# Patient Record
Sex: Female | Born: 1990 | Race: Black or African American | Hispanic: No | Marital: Single | Smoking: Never smoker
Health system: Southern US, Community
[De-identification: ages and names within clinical notes are randomized; demographics above are authoritative.]

## PROBLEM LIST (undated history)

## (undated) ENCOUNTER — Inpatient Hospital Stay (HOSPITAL_COMMUNITY): Payer: Self-pay

## (undated) DIAGNOSIS — R51 Headache: Secondary | ICD-10-CM

## (undated) DIAGNOSIS — L0292 Furuncle, unspecified: Secondary | ICD-10-CM

## (undated) DIAGNOSIS — F909 Attention-deficit hyperactivity disorder, unspecified type: Secondary | ICD-10-CM

## (undated) DIAGNOSIS — O210 Mild hyperemesis gravidarum: Secondary | ICD-10-CM

## (undated) DIAGNOSIS — N390 Urinary tract infection, site not specified: Secondary | ICD-10-CM

## (undated) DIAGNOSIS — D219 Benign neoplasm of connective and other soft tissue, unspecified: Secondary | ICD-10-CM

## (undated) DIAGNOSIS — T4145XA Adverse effect of unspecified anesthetic, initial encounter: Secondary | ICD-10-CM

## (undated) DIAGNOSIS — R519 Headache, unspecified: Secondary | ICD-10-CM

## (undated) DIAGNOSIS — T8859XA Other complications of anesthesia, initial encounter: Secondary | ICD-10-CM

## (undated) DIAGNOSIS — R87629 Unspecified abnormal cytological findings in specimens from vagina: Secondary | ICD-10-CM

## (undated) DIAGNOSIS — D649 Anemia, unspecified: Secondary | ICD-10-CM

## (undated) HISTORY — DX: Furuncle, unspecified: L02.92

## (undated) HISTORY — DX: Adverse effect of unspecified anesthetic, initial encounter: T41.45XA

## (undated) HISTORY — DX: Unspecified abnormal cytological findings in specimens from vagina: R87.629

## (undated) HISTORY — DX: Other complications of anesthesia, initial encounter: T88.59XA

## (undated) HISTORY — DX: Attention-deficit hyperactivity disorder, unspecified type: F90.9

## (undated) HISTORY — PX: WISDOM TOOTH EXTRACTION: SHX21

## (undated) HISTORY — PX: INDUCED ABORTION: SHX677

## (undated) HISTORY — PX: OTHER SURGICAL HISTORY: SHX169

---
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
## 2011-03-30 ENCOUNTER — Ambulatory Visit (INDEPENDENT_AMBULATORY_CARE_PROVIDER_SITE_OTHER): Payer: Medicaid Other | Admitting: *Deleted

## 2011-03-30 DIAGNOSIS — Z309 Encounter for contraceptive management, unspecified: Secondary | ICD-10-CM

## 2011-03-30 MED ORDER — MEDROXYPROGESTERONE ACETATE 150 MG/ML IM SUSP
150.0000 mg | Freq: Once | INTRAMUSCULAR | Status: AC
  Administered 2011-03-30: 150 mg via INTRAMUSCULAR

## 2011-03-30 NOTE — Progress Notes (Signed)
Urine pregnancy test done because patient is late for depo. Results negative. States she has not had sex in past week . Advised to use extra protection for next 7 days.

## 2011-05-29 LAB — CULTURE, ROUTINE-ABSCESS

## 2011-06-27 ENCOUNTER — Ambulatory Visit: Payer: Medicaid Other

## 2011-07-05 ENCOUNTER — Encounter: Payer: Medicaid Other | Admitting: Family Medicine

## 2011-08-14 ENCOUNTER — Ambulatory Visit (INDEPENDENT_AMBULATORY_CARE_PROVIDER_SITE_OTHER): Payer: Medicaid Other | Admitting: *Deleted

## 2011-08-14 DIAGNOSIS — Z309 Encounter for contraceptive management, unspecified: Secondary | ICD-10-CM

## 2011-08-14 LAB — POCT URINE PREGNANCY: Preg Test, Ur: NEGATIVE

## 2011-08-14 MED ORDER — MEDROXYPROGESTERONE ACETATE 150 MG/ML IM SUSP
150.0000 mg | Freq: Once | INTRAMUSCULAR | Status: AC
  Administered 2011-08-14: 150 mg via INTRAMUSCULAR

## 2011-08-14 NOTE — Progress Notes (Signed)
Patient is late for depo injection today. Urine pregnancy test negative. States last sex was 6-13 days ago but  a condom was used. Advised to return in 2 weeks for repeat urine pregnancy test. Extra protection for next 7 days.

## 2011-11-09 ENCOUNTER — Encounter: Payer: Medicaid Other | Admitting: Family Medicine

## 2011-11-10 ENCOUNTER — Ambulatory Visit (INDEPENDENT_AMBULATORY_CARE_PROVIDER_SITE_OTHER): Payer: Medicaid Other | Admitting: Family Medicine

## 2011-11-10 ENCOUNTER — Other Ambulatory Visit (HOSPITAL_COMMUNITY)
Admission: RE | Admit: 2011-11-10 | Discharge: 2011-11-10 | Disposition: A | Discharge status: Home / Self Care | Payer: Medicaid Other | Source: Ambulatory Visit | Attending: Family Medicine | Admitting: Family Medicine

## 2011-11-10 ENCOUNTER — Encounter: Payer: Self-pay | Admitting: Family Medicine

## 2011-11-10 VITALS — BP 123/78 | HR 92 | Ht 68.0 in | Wt 123.0 lb

## 2011-11-10 DIAGNOSIS — A499 Bacterial infection, unspecified: Secondary | ICD-10-CM

## 2011-11-10 DIAGNOSIS — Z113 Encounter for screening for infections with a predominantly sexual mode of transmission: Secondary | ICD-10-CM | POA: Insufficient documentation

## 2011-11-10 DIAGNOSIS — N760 Acute vaginitis: Secondary | ICD-10-CM

## 2011-11-10 DIAGNOSIS — N898 Other specified noninflammatory disorders of vagina: Secondary | ICD-10-CM

## 2011-11-10 DIAGNOSIS — B9689 Other specified bacterial agents as the cause of diseases classified elsewhere: Secondary | ICD-10-CM

## 2011-11-10 LAB — POCT WET PREP (WET MOUNT)

## 2011-11-10 MED ORDER — METRONIDAZOLE 500 MG PO TABS: 500.0000 mg | ORAL_TABLET | Freq: Two times a day (BID) | ORAL | Status: AC

## 2011-11-10 NOTE — Progress Notes (Signed)
  Subjective:    Patient ID: Sharon Barnett, female    DOB: April 14, 1991, 21 y.o.   MRN: [id number]  HPI VAGINAL DISCHARGE Onset: 1-2 weeks Description: Previously treated for BV one year. Discharge is similar to last episode of BV.  Modifying factors: none  Symptoms Odor: yes Itching: no Vaginal burning: no Dysuria: no Bleeding: no Pelvic pain: intermittently; associated with menses  Back pain: no Fever: no Genital sores: no Rash: no Dyspareunia: no GI Symptoms: no  Red Flags:  Missed period:no  Recent antibiotics:no  Possible STD exposure: No; no recent sexual activity in >1 year per pt  IUD: no Diabetes: no  Review of Systems See HPI, otherwise ROS negative     Objective:   Physical Exam Gen: in bed, NAD CV: RRR PULM: CTAB ABD: S/NT/+ bowel sounds GU: deferred as pt felt uncomfortable with female provider.    Assessment & Plan:

## 2011-11-10 NOTE — Assessment & Plan Note (Signed)
Exam clinically consistent with BV. Was not able to perform GYN exam out of pt discomfort with female provider. Discussed this with pt including option for female provider, which pt agreed to. Pt agreeable to urine GC/Chl and self wet prep. Will empirically treat for BV with flagyl. Instructed pt to follow up with new PCP in 2-4 weeks to discuss any other issues.

## 2011-11-10 NOTE — Patient Instructions (Signed)
Bacterial Vaginosis Bacterial vaginosis (BV) is a vaginal infection where the normal balance of bacteria in the vagina is disrupted. The normal balance is then replaced by an overgrowth of certain bacteria. There are several different kinds of bacteria that can cause BV. BV is the most common vaginal infection in women of childbearing age. CAUSES   The cause of BV is not fully understood. BV develops when there is an increase or imbalance of harmful bacteria.   Some activities or behaviors can upset the normal balance of bacteria in the vagina and put women at increased risk including:   Having a new sex partner or multiple sex partners.   Douching.   Using an intrauterine device (IUD) for contraception.   It is not clear what role sexual activity plays in the development of BV. However, women that have never had sexual intercourse are rarely infected with BV.  Women do not get BV from toilet seats, bedding, swimming pools or from touching objects around them.  SYMPTOMS   Grey vaginal discharge.   A fish-like odor with discharge, especially after sexual intercourse.   Itching or burning of the vagina and vulva.   Burning or pain with urination.   Some women have no signs or symptoms at all.  DIAGNOSIS  Your caregiver must examine the vagina for signs of BV. Your caregiver will perform lab tests and look at the sample of vaginal fluid through a microscope. They will look for bacteria and abnormal cells (clue cells), a pH test higher than 4.5, and a positive amine test all associated with BV.  RISKS AND COMPLICATIONS   Pelvic inflammatory disease (PID).   Infections following gynecology surgery.   Developing HIV.   Developing herpes virus.  TREATMENT  Sometimes BV will clear up without treatment. However, all women with symptoms of BV should be treated to avoid complications, especially if gynecology surgery is planned. Female partners generally do not need to be treated. However,  BV may spread between female sex partners so treatment is helpful in preventing a recurrence of BV.   BV may be treated with antibiotics. The antibiotics come in either pill or vaginal cream forms. Either can be used with nonpregnant or pregnant women, but the recommended dosages differ. These antibiotics are not harmful to the baby.   BV can recur after treatment. If this happens, a second round of antibiotics will often be prescribed.   Treatment is important for pregnant women. If not treated, BV can cause a premature delivery, especially for a pregnant woman who had a premature birth in the past. All pregnant women who have symptoms of BV should be checked and treated.   For chronic reoccurrence of BV, treatment with a type of prescribed gel vaginally twice a week is helpful.  HOME CARE INSTRUCTIONS   Finish all medication as directed by your caregiver.   Do not have sex until treatment is completed.   Tell your sexual partner that you have a vaginal infection. They should see their caregiver and be treated if they have problems, such as a mild rash or itching.   Practice safe sex. Use condoms. Only have 1 sex partner.  PREVENTION  Basic prevention steps can help reduce the risk of upsetting the natural balance of bacteria in the vagina and developing BV:  Do not have sexual intercourse (be abstinent).   Do not douche.   Use all of the medicine prescribed for treatment of BV, even if the signs and symptoms go away.     Tell your sex partner if you have BV. That way, they can be treated, if needed, to prevent reoccurrence.  SEEK MEDICAL CARE IF:   Your symptoms are not improving after 3 days of treatment.   You have increased discharge, pain, or fever.  MAKE SURE YOU:   Understand these instructions.   Will watch your condition.   Will get help right away if you are not doing well or get worse.  FOR MORE INFORMATION  Division of STD Prevention (DSTDP), Centers for Disease  Control and Prevention: www.cdc.gov/std American Social Health Association (ASHA): www.ashastd.org  Document Released: 08/21/2005 Document Revised: 08/10/2011 Document Reviewed: 02/11/2009 ExitCare Patient Information 2012 [employer]. 

## 2011-11-20 ENCOUNTER — Telehealth: Payer: Self-pay | Admitting: *Deleted

## 2011-11-20 NOTE — Telephone Encounter (Addendum)
Mother calling because patient's father passed away "suddenly" while patient was at school.  Wants appt for grief counseling.  Spoke with Hospice and due to age, patient qualifies for grief counseling with [facility] or an adult Veterinary surgeon.  Mother given phone number for [facility] [phone]) and [facility] adult counseling [phone] Levada Schilling).  Mother will contact them for an appt.  Gaylene Brooks, RN

## 2012-07-15 ENCOUNTER — Ambulatory Visit: Payer: Medicaid Other | Admitting: Emergency Medicine

## 2012-10-18 ENCOUNTER — Ambulatory Visit: Payer: Medicaid Other | Admitting: Family Medicine

## 2012-11-14 ENCOUNTER — Encounter: Payer: Medicaid Other | Admitting: Emergency Medicine

## 2012-12-06 ENCOUNTER — Encounter: Payer: Medicaid Other | Admitting: Emergency Medicine

## 2013-04-25 ENCOUNTER — Encounter: Payer: Self-pay | Admitting: Emergency Medicine

## 2013-04-25 ENCOUNTER — Ambulatory Visit (INDEPENDENT_AMBULATORY_CARE_PROVIDER_SITE_OTHER): Payer: Medicaid Other | Admitting: Emergency Medicine

## 2013-04-25 VITALS — BP 116/75 | HR 85 | Ht 68.5 in | Wt 117.8 lb

## 2013-04-25 DIAGNOSIS — R82998 Other abnormal findings in urine: Secondary | ICD-10-CM

## 2013-04-25 DIAGNOSIS — N898 Other specified noninflammatory disorders of vagina: Secondary | ICD-10-CM

## 2013-04-25 LAB — POCT URINALYSIS DIPSTICK
Ketones, UA: NEGATIVE
Nitrite, UA: NEGATIVE
Protein, UA: 30

## 2013-04-25 LAB — POCT WET PREP (WET MOUNT)

## 2013-04-25 LAB — POCT UA - MICROSCOPIC ONLY

## 2013-04-25 MED ORDER — METRONIDAZOLE 500 MG PO TABS: 500.0000 mg | ORAL_TABLET | Freq: Two times a day (BID) | ORAL | Status: DC

## 2013-04-25 MED ORDER — HYDROCORTISONE 2.5 % EX CREA: TOPICAL_CREAM | Freq: Two times a day (BID) | CUTANEOUS | Status: DC

## 2013-04-25 NOTE — Progress Notes (Signed)
  Subjective:    Patient ID: Sharon Barnett, female    DOB: 01-Jan-1991, 21 y.o.   MRN: [id number]  HPI Sharon Barnett is here for vaginal discharge.  She reports that she has had a thin white discharge for the last month.  She states that it is getting worse.  She has not noticed an odor.  Washes with dial or body wash.  No fevers, chills, nausea, vomiting, pelvic pain.  Denies new sexual partners.  Denies using vibrators.  Reports dark urine, but no dysuria.  I have reviewed and updated the following as appropriate: allergies and current medications SHx: never smoker  Review of Systems See HPI    Objective:   Physical Exam BP 116/75  Pulse 85  Ht 5' 8.5" (1.74 m)  Wt 117 lb 12.8 oz (53.434 kg)  BMI 17.65 kg/m2  LMP 04/10/2013 Gen: alert, cooperative, NAD Abd: +BS, soft, NTND  UA: concentrated with spec gravity of >1.030, 30 protein Wet prep: clue cells    Assessment & Plan:

## 2013-04-25 NOTE — Patient Instructions (Addendum)
It was nice to see you!  You have bacterial vaginosis.  Take flagyl 1 pill twice a day for 7 days.  The only soap you can use in the genital area is mild dove - with no scents, fragrances or dyes.  Make an appointment with me after the holidays to do your well woman exam.

## 2013-04-25 NOTE — Assessment & Plan Note (Signed)
BV on wet prep. Flagyl 500mg  BID x7 days. Discussed avoiding harsh soaps to prevent recurrence.  Also discussed general hygiene.

## 2013-08-21 ENCOUNTER — Emergency Department (HOSPITAL_COMMUNITY)
Admission: EM | Admit: 2013-08-21 | Discharge: 2013-08-21 | Disposition: A | Discharge status: Home / Self Care | Payer: Medicaid Other | Attending: Emergency Medicine | Admitting: Emergency Medicine

## 2013-08-21 ENCOUNTER — Emergency Department (HOSPITAL_COMMUNITY): Payer: Medicaid Other

## 2013-08-21 ENCOUNTER — Encounter (HOSPITAL_COMMUNITY): Payer: Self-pay | Admitting: Emergency Medicine

## 2013-08-21 DIAGNOSIS — R55 Syncope and collapse: Secondary | ICD-10-CM | POA: Insufficient documentation

## 2013-08-21 DIAGNOSIS — Z872 Personal history of diseases of the skin and subcutaneous tissue: Secondary | ICD-10-CM | POA: Insufficient documentation

## 2013-08-21 DIAGNOSIS — Z8659 Personal history of other mental and behavioral disorders: Secondary | ICD-10-CM | POA: Insufficient documentation

## 2013-08-21 DIAGNOSIS — T4275XA Adverse effect of unspecified antiepileptic and sedative-hypnotic drugs, initial encounter: Secondary | ICD-10-CM | POA: Insufficient documentation

## 2013-08-21 DIAGNOSIS — K1370 Unspecified lesions of oral mucosa: Secondary | ICD-10-CM | POA: Insufficient documentation

## 2013-08-21 DIAGNOSIS — T50905A Adverse effect of unspecified drugs, medicaments and biological substances, initial encounter: Secondary | ICD-10-CM

## 2013-08-21 DIAGNOSIS — Z3202 Encounter for pregnancy test, result negative: Secondary | ICD-10-CM | POA: Insufficient documentation

## 2013-08-21 LAB — CBC WITH DIFFERENTIAL/PLATELET
Basophils Absolute: 0 10*3/uL (ref 0.0–0.1)
Eosinophils Absolute: 0 10*3/uL (ref 0.0–0.7)
HCT: 36.5 % (ref 36.0–46.0)
MCV: 89.2 fL (ref 78.0–100.0)
Neutrophils Relative %: 74 % (ref 43–77)
Platelets: 287 10*3/uL (ref 150–400)
RBC: 4.09 MIL/uL (ref 3.87–5.11)
RDW: 12.8 % (ref 11.5–15.5)
WBC: 6.1 10*3/uL (ref 4.0–10.5)

## 2013-08-21 LAB — BASIC METABOLIC PANEL
BUN: 6 mg/dL (ref 6–23)
Calcium: 9 mg/dL (ref 8.4–10.5)
Creatinine, Ser: 0.71 mg/dL (ref 0.50–1.10)
GFR calc non Af Amer: >90 mL/min (ref >90)
Glucose, Bld: 87 mg/dL (ref 70–99)
Sodium: 138 mEq/L (ref 135–145)

## 2013-08-21 MED ORDER — SODIUM CHLORIDE 0.9 % IV BOLUS (SEPSIS)
500.0000 mL | Freq: Once | INTRAVENOUS | Status: AC
  Administered 2013-08-21: 500 mL via INTRAVENOUS

## 2013-08-21 NOTE — ED Provider Notes (Signed)
CSN: [id number]     Arrival date & time 08/21/13  1234 History   First MD Initiated Contact with Patient 08/21/13 1240     Chief Complaint  Patient presents with  . Medication Reaction    Patient is a 22 y.o. female presenting with syncope. The history is provided by the EMS personnel and the patient. The history is limited by the condition of the patient.  Loss of Consciousness Episode history:  Single Timing:  Constant Progression:  Resolved Context comment:  After dental extraction Witnessed: yes   Relieved by:  Nothing Associated symptoms comment:  Bleeding from mouth  PT had dental extraction today and had versed and propofol given for sedation After procedure, while leaving, the family felt that she was "not breathing" Office workers checked on her and thought she did not have pulse. CPR was started EMS was called and on their arrival she had pulse, but pt still appears confused No other details are known at arrival  Past Medical History  Diagnosis Date  . Boils   . ADHD (attention deficit hyperactivity disorder)    History reviewed. No pertinent past surgical history. History reviewed. No pertinent family history. History  Substance Use Topics  . Smoking status: Never Smoker   . Smokeless tobacco: Not on file  . Alcohol Use: No   OB History   Grav Para Term Preterm Abortions TAB SAB Ect Mult Living                 Review of Systems  Unable to perform ROS: Mental status change  Cardiovascular: Positive for syncope.    Allergies  Review of patient's allergies indicates no known allergies.  Home Medications   Current Outpatient Rx  Name  Route  Sig  Dispense  Refill  . ibuprofen (ADVIL,MOTRIN) 200 MG tablet   Oral   Take 400 mg by mouth every 6 (six) hours as needed.          BP 149/100  Pulse 75  Temp(Src) 98.5 F (36.9 C) (Oral)  Resp 21  Wt 120 lb (54.432 kg)  SpO2 100%  LMP 08/21/2013 Physical Exam CONSTITUTIONAL: Well developed/well  nourished HEAD: Normocephalic/atraumatic EYES: EOMI/PERRL ENMT: Mucous membranes moist. Blood noted in mouth.  There is no active bleeding from dental extraction sites NECK: supple no meningeal signs SPINE:entire spine nontender CV: S1/S2 noted, no murmurs/rubs/gallops noted LUNGS: Lungs are clear to auscultation bilaterally, no apparent distress Chest - no crepitance to chest ABDOMEN: soft, nontender, no rebound or guarding GU:no cva tenderness NEURO: Pt is awake/alert, moves all extremitiesx4, but she does appear confused and does not answer all questions at this time EXTREMITIES: pulses normal, full ROM SKIN: warm, color normal PSYCH: no abnormalities of mood noted  ED Course  Procedures (including critical care time) Labs Review Labs Reviewed  BASIC METABOLIC PANEL  CBC WITH DIFFERENTIAL   Imaging Review No results found.  EKG Interpretation   None      1:19 PM Pt with syncopal episode after dental extraction She is now awake but does appear confused Labs/ekg ordered.  CXR ordered due to CPR that was initiated  2:21 PM Pt back to baseline D/w dr Barbette Merino her oral surgeon There was no compressions She only received rescue breathing and did not lose pulses He feels this is likely med reaction Pt feels improved and requesting d/c home She has never had syncope before No fam h/o sudden death Her oral bleeding is improved and dr Barbette Merino reports that she  can go home without packing Mother will take home and we discussed strict return precautions  MDM  No diagnosis found. Nursing notes including past medical history and social history reviewed and considered in documentation Labs/vital reviewed and considered xrays reviewed and considered     Joya Gaskins, MD 08/21/13 1422

## 2013-08-21 NOTE — ED Notes (Signed)
Pt sitting up on side of bed requesting to leave, states she feels better, family at bedside, no distress noted, alert and oriented

## 2013-08-21 NOTE — ED Provider Notes (Signed)
EKG Interpretation    Date/Time:  Thursday August 21 2013 13:18:01 EST Ventricular Rate:  74 PR Interval:  133 QRS Duration: 74 QT Interval:  371 QTC Calculation: 412 R Axis:   73 Text Interpretation:  Sinus rhythm inverted T wave in V2 no other acute findings Confirmed by Bebe Shaggy  MD, Kaulana Brindle [phone]) on 08/21/2013 1:27:14 PM              Joya Gaskins, MD 08/21/13 1423

## 2013-08-21 NOTE — ED Notes (Signed)
Pt in from dental procedure office where she had her wisdom teeth pulled today, pt was given versed and propofol during procedure, after patient was discharged and was in the parking lot was family, her family didn't feel like she was breathing right and states they couldn't wake her up, the office workers came out and weren't sure if they could feel a pulse, chest compressions initiated at that time, upon fire and EMS arrival patient had a pulse and was breathing spontaneously, they stopped the chest compressions, pt responds to verbal stimuli and nods her head appropriately in response to questions, maintaining airway at this time, even, unlabored and regular respirations at this time. IV established PTA.

## 2013-10-16 ENCOUNTER — Ambulatory Visit: Payer: Medicaid Other | Admitting: Emergency Medicine

## 2014-03-03 ENCOUNTER — Encounter: Payer: Self-pay | Admitting: Family Medicine

## 2014-03-03 ENCOUNTER — Ambulatory Visit (INDEPENDENT_AMBULATORY_CARE_PROVIDER_SITE_OTHER): Payer: Medicaid Other | Admitting: Family Medicine

## 2014-03-03 VITALS — BP 121/76 | HR 66 | Temp 97.8°F | Ht 68.0 in | Wt 123.0 lb

## 2014-03-03 DIAGNOSIS — R21 Rash and other nonspecific skin eruption: Secondary | ICD-10-CM | POA: Insufficient documentation

## 2014-03-03 DIAGNOSIS — B977 Papillomavirus as the cause of diseases classified elsewhere: Secondary | ICD-10-CM

## 2014-03-03 MED ORDER — MUPIROCIN CALCIUM 2 % EX CREA: 1.0000 "application " | TOPICAL_CREAM | Freq: Two times a day (BID) | CUTANEOUS | Status: DC

## 2014-03-03 MED ORDER — MUPIROCIN 2 % EX OINT: 1.0000 "application " | TOPICAL_OINTMENT | Freq: Two times a day (BID) | CUTANEOUS | Status: DC

## 2014-03-03 NOTE — Assessment & Plan Note (Signed)
Patient with 4 small lesions that are likely a folliculitis related to her shaving her vulva. There does not appear to be a spreading infection. I discussed this diagnosis with the patient and advised that the best way to prevent this is to not shave that area. I gave her a prescription for antibiotic ointment to place on the lesions to prevent secondary infection. F/u prn.

## 2014-03-03 NOTE — Progress Notes (Signed)
Patient ID: Sharon Barnett, female   DOB: 05/12/1991, 23 y.o.   MRN: [id number]  Sharon AlarEric Sonnenberg, MD Phone: [phone]  Sharon Barnett is a 23 y.o. female who presents today for same day.  Vulvar bumps: patient notes history of vulvar bumps since last visit in August 2014. She was told at that time they were related to her shaving and was advised to stop. She has not stopped shaving and has continued to have bumps. She notes that when the bumps goa away they leave dark tissue. Feels like the bumps are in her skin. They get irritated if she shaves. She has no history of STDs. She is not sexually active with males.  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 She is concerned about what this means.   Patient is a nonsmoker.   ROS: Per HPI   Physical Exam Filed Vitals:   03/03/14 1105  BP: 121/76  Pulse: 66  Temp: 97.8 F (36.6 C)     Gen: Well NAD Skin: inguinal area with 4 scattered 2-3 mm bumps, they are rubbery feeling, no surrounding erythema and no fluctuance, no drainage, one appears to have had the top shaved off and has a scab on it   Assessment/Plan: Please see individual problem list.  # Healthcare maintenance: not addressed

## 2014-03-03 NOTE — Addendum Note (Signed)
Addended by: Birdie SonsSONNENBERG, ERIC G on: 03/03/2014 12:17 PM   Modules accepted: Orders, Medications

## 2014-03-03 NOTE — Patient Instructions (Addendum)
Nice to meet you. The bumps appear to be irritated hair follicle from shaving. Please use the antibiotic ointment on these areas. Please also stop shaving. You can also use a warm wash cloth on the areas as well. If these get worse please let us know.

## 2014-03-03 NOTE — Assessment & Plan Note (Signed)
Discussed that this is a risk factor for cervical cancer and genital warts. Also discussed that in young females this infection often resolves with time as the body is able to fight this off. This will be followed on pap smears in the future. Will await records.

## 2014-07-15 ENCOUNTER — Encounter: Payer: Medicaid Other | Admitting: Family Medicine

## 2014-11-25 ENCOUNTER — Emergency Department (HOSPITAL_COMMUNITY): Payer: Medicaid Other

## 2014-11-25 ENCOUNTER — Encounter (HOSPITAL_COMMUNITY): Payer: Self-pay | Admitting: Emergency Medicine

## 2014-11-25 ENCOUNTER — Emergency Department (HOSPITAL_COMMUNITY)
Admission: EM | Admit: 2014-11-25 | Discharge: 2014-11-25 | Disposition: A | Discharge status: Home / Self Care | Payer: Medicaid Other | Attending: Emergency Medicine | Admitting: Emergency Medicine

## 2014-11-25 DIAGNOSIS — Z872 Personal history of diseases of the skin and subcutaneous tissue: Secondary | ICD-10-CM | POA: Insufficient documentation

## 2014-11-25 DIAGNOSIS — M542 Cervicalgia: Secondary | ICD-10-CM | POA: Insufficient documentation

## 2014-11-25 DIAGNOSIS — R590 Localized enlarged lymph nodes: Secondary | ICD-10-CM

## 2014-11-25 DIAGNOSIS — R079 Chest pain, unspecified: Secondary | ICD-10-CM | POA: Insufficient documentation

## 2014-11-25 DIAGNOSIS — Z3202 Encounter for pregnancy test, result negative: Secondary | ICD-10-CM | POA: Insufficient documentation

## 2014-11-25 DIAGNOSIS — Z8659 Personal history of other mental and behavioral disorders: Secondary | ICD-10-CM | POA: Insufficient documentation

## 2014-11-25 LAB — BASIC METABOLIC PANEL
ANION GAP: 6 (ref 5–15)
CALCIUM: 9.1 mg/dL (ref 8.4–10.5)
CHLORIDE: 105 mmol/L (ref 96–112)
CO2: 27 mmol/L (ref 19–32)
Creatinine, Ser: 0.68 mg/dL (ref 0.50–1.10)
GFR calc Af Amer: >90 mL/min (ref >90)
Glucose, Bld: 85 mg/dL (ref 70–99)
Potassium: 3.5 mmol/L (ref 3.5–5.1)
Sodium: 138 mmol/L (ref 135–145)

## 2014-11-25 LAB — CBC WITH DIFFERENTIAL/PLATELET
Basophils Absolute: 0 10*3/uL (ref 0.0–0.1)
Basophils Relative: 0 % (ref 0–1)
Eosinophils Absolute: 0.2 10*3/uL (ref 0.0–0.7)
Eosinophils Relative: 3 % (ref 0–5)
HCT: 33.3 % — ABNORMAL LOW (ref 36.0–46.0)
Hemoglobin: 11 g/dL — ABNORMAL LOW (ref 12.0–15.0)
LYMPHS ABS: 3.5 10*3/uL (ref 0.7–4.0)
LYMPHS PCT: 57 % — AB (ref 12–46)
MCH: 29.1 pg (ref 26.0–34.0)
MCHC: 33 g/dL (ref 30.0–36.0)
MCV: 88.1 fL (ref 78.0–100.0)
Monocytes Absolute: 0.3 10*3/uL (ref 0.1–1.0)
Monocytes Relative: 6 % (ref 3–12)
NEUTROS PCT: 35 % — AB (ref 43–77)
Neutro Abs: 2.2 10*3/uL (ref 1.7–7.7)
PLATELETS: 272 10*3/uL (ref 150–400)
RBC: 3.78 MIL/uL — ABNORMAL LOW (ref 3.87–5.11)
RDW: 12.5 % (ref 11.5–15.5)
WBC: 6.2 10*3/uL (ref 4.0–10.5)

## 2014-11-25 LAB — POC URINE PREG, ED: PREG TEST UR: NEGATIVE

## 2014-11-25 MED ORDER — IOHEXOL 300 MG/ML  SOLN
75.0000 mL | Freq: Once | INTRAMUSCULAR | Status: AC | PRN
Start: 2014-11-25 — End: 2014-11-25
  Administered 2014-11-25: 75 mL via INTRAVENOUS

## 2014-11-25 NOTE — Discharge Instructions (Signed)
Your CT scan today shows several enlarged lymph nodes.  This is most likely a reaction to local inflammation.  Warm compresses to the area, alternate Tylenol and ibuprofen for pain.  This should improve and go away over the next 2-4 weeks.  Follow-up with your primary care doctor.  If it is not improving or if it is getting larger, if you are having difficulties swallowing or breathing or other concerning symptoms.    Cervical Adenitis You have a swollen lymph gland in your neck. This commonly happens with Strep and virus infections, dental problems, insect bites, and injuries about the face, scalp, or neck. The lymph glands swell as the body fights the infection or heals the injury. Swelling and firmness typically lasts for several weeks after the infection or injury is healed. Rarely lymph glands can become swollen because of cancer or TB. Antibiotics are prescribed if there is evidence of an infection. Sometimes an infected lymph gland becomes filled with pus. This condition may require opening up the abscessed gland by draining it surgically. Most of the time infected glands return to normal within two weeks. Do not poke or squeeze the swollen lymph nodes. That may keep them from shrinking back to their normal size. If the lymph gland is still swollen after 2 weeks, further medical evaluation is needed.  SEEK IMMEDIATE MEDICAL CARE IF:  You have difficulty swallowing or breathing, increased swelling, severe pain, or a high fever.  Document Released: 08/21/2005 Document Revised: 11/13/2011 Document Reviewed: 02/10/2007 [facility] Patient Information 2015 [place], [place]. This information is not intended to replace advice given to you by your health care provider. Make sure you discuss any questions you have with your health care provider.   Lymphadenopathy Lymphadenopathy means "disease of the lymph glands." But the term is usually used to describe swollen or enlarged lymph glands, also called lymph  nodes. These are the bean-shaped organs found in many locations including the neck, underarm, and groin. Lymph glands are part of the immune system, which fights infections in your body. Lymphadenopathy can occur in just one area of the body, such as the neck, or it can be generalized, with lymph node enlargement in several areas. The nodes found in the neck are the most common sites of lymphadenopathy. CAUSES When your immune system responds to germs (such as viruses or bacteria ), infection-fighting cells and fluid build up. This causes the glands to grow in size. Usually, this is not something to worry about. Sometimes, the glands themselves can become infected and inflamed. This is called lymphadenitis. Enlarged lymph nodes can be caused by many diseases:  Bacterial disease, such as strep throat or a skin infection.  Viral disease, such as a common cold.  Other germs, such as Lyme disease, tuberculosis, or sexually transmitted diseases.  Cancers, such as lymphoma (cancer of the lymphatic system) or leukemia (cancer of the white blood cells).  Inflammatory diseases such as lupus or rheumatoid arthritis.  Reactions to medications. Many of the diseases above are rare, but important. This is why you should see your caregiver if you have lymphadenopathy. SYMPTOMS  Swollen, enlarged lumps in the neck, back of the head, or other locations.  Tenderness.  Warmth or redness of the skin over the lymph nodes.  Fever. DIAGNOSIS Enlarged lymph nodes are often near the source of infection. They can help health care providers diagnose your illness. For instance:  Swollen lymph nodes around the jaw might be caused by an infection in the mouth.  Enlarged  glands in the neck often signal a throat infection.  Lymph nodes that are swollen in more than one area often indicate an illness caused by a virus. Your caregiver will likely know what is causing your lymphadenopathy after listening to your  history and examining you. Blood tests, x-rays, or other tests may be needed. If the cause of the enlarged lymph node cannot be found, and it does not go away by itself, then a biopsy may be needed. Your caregiver will discuss this with you. TREATMENT Treatment for your enlarged lymph nodes will depend on the cause. Many times the nodes will shrink to normal size by themselves, with no treatment. Antibiotics or other medicines may be needed for infection. Only take over-the-counter or prescription medicines for pain, discomfort, or fever as directed by your caregiver. HOME CARE INSTRUCTIONS Swollen lymph glands usually return to normal when the underlying medical condition goes away. If they persist, contact your health-care provider. He/she might prescribe antibiotics or other treatments, depending on the diagnosis. Take any medications exactly as prescribed. Keep any follow-up appointments made to check on the condition of your enlarged nodes. SEEK MEDICAL CARE IF:  Swelling lasts for more than two weeks.  You have symptoms such as weight loss, night sweats, fatigue, or fever that does not go away.  The lymph nodes are hard, seem fixed to the skin, or are growing rapidly.  Skin over the lymph nodes is red and inflamed. This could mean there is an infection. SEEK IMMEDIATE MEDICAL CARE IF:  Fluid starts leaking from the area of the enlarged lymph node.  You develop a fever of 102 F (38.9 C) or greater.  Severe pain develops (not necessarily at the site of a large lymph node).  You develop chest pain or shortness of breath.  You develop worsening abdominal pain. MAKE SURE YOU:  Understand these instructions.  Will watch your condition.  Will get help right away if you are not doing well or get worse. Document Released: 05/30/2008 Document Revised: 01/05/2014 Document Reviewed: 05/30/2008 [facility] Patient Information 2015 [place]. This information is not intended to replace  advice given to you by your health care provider. Make sure you discuss any questions you have with your health care provider.

## 2014-11-25 NOTE — ED Notes (Addendum)
Pt reports sore "lump" on left side of neck that she has had since this am. Denies fever, reports cough with some congestion and sore, tight throat.

## 2014-11-25 NOTE — ED Notes (Signed)
Patient here with "neck pain and swelling". Patient appears to have very swollen lymph node on left side of neck. Area tender and swollen, and solid on palpation. Airway intact.

## 2014-11-25 NOTE — ED Notes (Signed)
Pt ambulated to and from restroom. 

## 2014-11-25 NOTE — ED Provider Notes (Signed)
CSN: [id number]     Arrival date & time 11/25/14  0129 History  This chart was scribed for Sharon Severinlga Destini Cambre, MD by Bronson CurbJacqueline Melvin, ED Scribe. This patient was seen in room A06C/A06C and the patient's care was started at 3:05 AM.    Chief Complaint  Patient presents with  . Lymphadenopathy    The history is provided by the patient and a relative. No language interpreter was used.     HPI Comments: Sharon Barnett is a 24 y.o. female who presents to the Emergency Department complaining of a painful "lump" on the left sided of the neck since yesterday morning. There is associated chills, and patient notes the pain is worse when swallowing. She denies history of prior similar episodes. Patient reports a fractured tooth/dental pain, but states this was repaired approximately 1 year ago. She denies swelling in any other areas. Grandmother notes possible sick contacts. Patient denies fever.  Patient is also complaining of intermittent, generalized chest pain over the last 2 weeks, approximately 2-3 times per week. She states the episodes occurr randomly during the middle of the day and last approximately 3-4 minutes. She notes the chest pain is worse when deep breathing. She denies nausea, vomiting, SOB, diaphoresis.    Past Medical History  Diagnosis Date  . Boils   . ADHD (attention deficit hyperactivity disorder)    History reviewed. No pertinent past surgical history. History reviewed. No pertinent family history. History  Substance Use Topics  . Smoking status: Never Smoker   . Smokeless tobacco: Not on file  . Alcohol Use: No   OB History    No data available     Review of Systems  Constitutional: Positive for chills. Negative for fever and diaphoresis.  Respiratory: Negative for shortness of breath.   Cardiovascular: Positive for chest pain.  Gastrointestinal: Negative for nausea and vomiting.  Musculoskeletal: Positive for neck pain.      Allergies  Review of patient's  allergies indicates no known allergies.  Home Medications   Prior to Admission medications   Medication Sig Start Date End Date Taking? Authorizing Provider  ibuprofen (ADVIL,MOTRIN) 200 MG tablet Take 400 mg by mouth every 6 (six) hours as needed for moderate pain.    Yes Historical Provider, MD  mupirocin ointment (BACTROBAN) 2 % Place 1 application into the nose 2 (two) times daily. Patient not taking: Reported on 11/25/2014 03/03/14   Glori LuisEric G Sonnenberg, MD   Triage Vitals: BP 126/74 mmHg  Pulse 63  Temp(Src) 97.9 F (36.6 C) (Oral)  Resp 16  Ht 5\' 8"  (1.727 m)  Wt 125 lb (56.7 kg)  BMI 19.01 kg/m2  SpO2 99%  LMP 11/17/2014 (Exact Date)  Physical Exam  Constitutional: She is oriented to person, place, and time. She appears well-developed and well-nourished. No distress.  HENT:  Head: Normocephalic and atraumatic.  Right Ear: External ear normal.  Left Ear: External ear normal.  Nose: Nose normal.  Mouth/Throat: Oropharynx is clear and moist. No oropharyngeal exudate.  Patient has 2 cm firm tender mass in her submental region.  Eyes: Conjunctivae and EOM are normal. Pupils are equal, round, and reactive to light.  Neck: Normal range of motion. Neck supple. No JVD present. No tracheal deviation present. No thyromegaly present.  Cardiovascular: Normal rate, regular rhythm, normal heart sounds and intact distal pulses.  Exam reveals no gallop and no friction rub.   No murmur heard. Pulmonary/Chest: Effort normal and breath sounds normal. No stridor. No respiratory distress. She  has no wheezes. She has no rales. She exhibits no tenderness.  Abdominal: Soft. Bowel sounds are normal. She exhibits no distension and no mass. There is no tenderness. There is no rebound and no guarding.  Musculoskeletal: Normal range of motion. She exhibits no edema or tenderness.  Lymphadenopathy:    She has no cervical adenopathy.  Neurological: She is alert and oriented to person, place, and time. She  displays normal reflexes. She exhibits normal muscle tone. Coordination normal.  Skin: Skin is warm and dry. No rash noted. No erythema. No pallor.  Psychiatric: She has a normal mood and affect. Her behavior is normal. Judgment and thought content normal.  Nursing note and vitals reviewed.   ED Course  Procedures (including critical care time)  DIAGNOSTIC STUDIES: Oxygen Saturation is 99% on room air, normal by my interpretation.    COORDINATION OF CARE: At [phone] Discussed treatment plan with patient which includes imaging and labs. Patient agrees.   Labs Review Labs Reviewed  CBC WITH DIFFERENTIAL/PLATELET - Abnormal; Notable for the following:    RBC 3.78 (*)    Hemoglobin 11.0 (*)    HCT 33.3 (*)    Neutrophils Relative % 35 (*)    Lymphocytes Relative 57 (*)    All other components within normal limits  BASIC METABOLIC PANEL  POC URINE PREG, ED    Imaging Review Ct Soft Tissue Neck W Contrast  11/25/2014   CLINICAL DATA:  Submental adenopathy.  Left-sided neck pain.  EXAM: CT NECK WITH CONTRAST  TECHNIQUE: Multidetector CT imaging of the neck was performed using the standard protocol following the bolus administration of intravenous contrast.  CONTRAST:  75mL OMNIPAQUE IOHEXOL 300 MG/ML  SOLN  COMPARISON:  None.  FINDINGS: Pharynx and larynx: No tonsillar abscess or fluid collection. Minimal adenoidal tissue. The epiglottis is normal. No retropharyngeal fluid collection or edema.  Salivary glands: Prominent submandibular glands. Parotid glands are normal. No sialolith or ductal dilatation.  Thyroid: Normal.  Lymph nodes: Prominent submental lymph nodes measures 10 mm in short axis dimension. Otherwise no cervical adenopathy.  Vascular: Normal.  Limited intracranial: Normal.  Visualized orbits: Normal where visualized.  Mastoids and visualized paranasal sinuses: Well-aerated and clear. No fluid levels.  Skeleton: Streak artifact from dental hardware.  Otherwise normal.  Upper chest:  Lung apices are clear.  IMPRESSION: Prominent submental lymph nodes and submandibular glands, likely reactive. No fluid collection or abscess in the neck.   Electronically Signed   By: Rubye Oaks M.D.   On: 11/25/2014 04:32     EKG Interpretation None      MDM   Final diagnoses:  Submental adenopathy  Reactive cervical lymphadenopathy    24 year old female with new onset of swelling under chin.  Differential includes sialolith, lymphadenopathy, abscess.  Plan for labs and CT soft tissue neck.  Patient is not a great historian, although she reports night sweats and chills.  This sounds like it is been going on for some time.  I personally performed the services described in this documentation, which was scribed in my presence. The recorded information has been reviewed and is accurate.     Sharon Severin, MD 11/25/14 [phone]

## 2015-08-05 ENCOUNTER — Encounter: Payer: Medicaid Other | Admitting: Family Medicine

## 2015-08-05 NOTE — Progress Notes (Signed)
Opened in error; Disregard.

## 2015-08-19 ENCOUNTER — Encounter: Payer: Medicaid Other | Admitting: Family Medicine

## 2015-08-29 ENCOUNTER — Emergency Department (HOSPITAL_COMMUNITY): Payer: Medicaid Other

## 2015-08-29 ENCOUNTER — Encounter (HOSPITAL_COMMUNITY): Payer: Self-pay | Admitting: Emergency Medicine

## 2015-08-29 ENCOUNTER — Emergency Department (HOSPITAL_COMMUNITY)
Admission: EM | Admit: 2015-08-29 | Discharge: 2015-08-30 | Disposition: A | Discharge status: Home / Self Care | Payer: Medicaid Other | Attending: Emergency Medicine | Admitting: Emergency Medicine

## 2015-08-29 DIAGNOSIS — J3489 Other specified disorders of nose and nasal sinuses: Secondary | ICD-10-CM | POA: Insufficient documentation

## 2015-08-29 DIAGNOSIS — R0982 Postnasal drip: Secondary | ICD-10-CM | POA: Insufficient documentation

## 2015-08-29 DIAGNOSIS — R0602 Shortness of breath: Secondary | ICD-10-CM | POA: Insufficient documentation

## 2015-08-29 DIAGNOSIS — Z8659 Personal history of other mental and behavioral disorders: Secondary | ICD-10-CM | POA: Insufficient documentation

## 2015-08-29 DIAGNOSIS — R0789 Other chest pain: Secondary | ICD-10-CM | POA: Insufficient documentation

## 2015-08-29 DIAGNOSIS — Z872 Personal history of diseases of the skin and subcutaneous tissue: Secondary | ICD-10-CM | POA: Insufficient documentation

## 2015-08-29 LAB — CBC
HCT: 34.6 % — ABNORMAL LOW (ref 36.0–46.0)
Hemoglobin: 11.5 g/dL — ABNORMAL LOW (ref 12.0–15.0)
MCH: 29.6 pg (ref 26.0–34.0)
MCHC: 33.2 g/dL (ref 30.0–36.0)
MCV: 88.9 fL (ref 78.0–100.0)
PLATELETS: 279 10*3/uL (ref 150–400)
RBC: 3.89 MIL/uL (ref 3.87–5.11)
RDW: 12.5 % (ref 11.5–15.5)
WBC: 6.2 10*3/uL (ref 4.0–10.5)

## 2015-08-29 LAB — BASIC METABOLIC PANEL
ANION GAP: 9 (ref 5–15)
BUN: 9 mg/dL (ref 6–20)
CO2: 24 mmol/L (ref 22–32)
Calcium: 9.7 mg/dL (ref 8.9–10.3)
Chloride: 107 mmol/L (ref 101–111)
Creatinine, Ser: 0.79 mg/dL (ref 0.44–1.00)
GLUCOSE: 100 mg/dL — AB (ref 65–99)
POTASSIUM: 4 mmol/L (ref 3.5–5.1)
Sodium: 140 mmol/L (ref 135–145)

## 2015-08-29 LAB — I-STAT TROPONIN, ED: Troponin i, poc: 0 ng/mL (ref 0.00–0.08)

## 2015-08-29 NOTE — ED Notes (Signed)
C/o L sided chest pain that radiates to back with sob since 7am.  Pain is worse with movement and deep inspiration.

## 2015-08-30 MED ORDER — ACETAMINOPHEN 325 MG PO TABS
650.0000 mg | ORAL_TABLET | Freq: Once | ORAL | Status: AC
  Administered 2015-08-30: 650 mg via ORAL
  Filled 2015-08-30: qty 2

## 2015-08-30 MED ORDER — NAPROXEN 250 MG PO TABS: 250.0000 mg | ORAL_TABLET | Freq: Two times a day (BID) | ORAL | Status: DC

## 2015-08-30 NOTE — Discharge Instructions (Signed)
Nonspecific Chest Pain  °Chest pain can be caused by many different conditions. There is always a chance that your pain could be related to something serious, such as a heart attack or a blood clot in your lungs. Chest pain can also be caused by conditions that are not life-threatening. If you have chest pain, it is very important to follow up with your health care provider. °CAUSES  °Chest pain can be caused by: °· Heartburn. °· Pneumonia or bronchitis. °· Anxiety or stress. °· Inflammation around your heart (pericarditis) or lung (pleuritis or pleurisy). °· A blood clot in your lung. °· A collapsed lung (pneumothorax). It can develop suddenly on its own (spontaneous pneumothorax) or from trauma to the chest. °· Shingles infection (varicella-zoster virus). °· Heart attack. °· Damage to the bones, muscles, and cartilage that make up your chest wall. This can include: °· Bruised bones due to injury. °· Strained muscles or cartilage due to frequent or repeated coughing or overwork. °· Fracture to one or more ribs. °· Sore cartilage due to inflammation (costochondritis). °RISK FACTORS  °Risk factors for chest pain may include: °· Activities that increase your risk for trauma or injury to your chest. °· Respiratory infections or conditions that cause frequent coughing. °· Medical conditions or overeating that can cause heartburn. °· Heart disease or family history of heart disease. °· Conditions or health behaviors that increase your risk of developing a blood clot. °· Having had chicken pox (varicella zoster). °SIGNS AND SYMPTOMS °Chest pain can feel like: °· Burning or tingling on the surface of your chest or deep in your chest. °· Crushing, pressure, aching, or squeezing pain. °· Dull or sharp pain that is worse when you move, cough, or take a deep breath. °· Pain that is also felt in your back, neck, shoulder, or arm, or pain that spreads to any of these areas. °Your chest pain may come and go, or it may stay  constant. °DIAGNOSIS °Lab tests or other studies may be needed to find the cause of your pain. Your health care provider may have you take a test called an ambulatory ECG (electrocardiogram). An ECG records your heartbeat patterns at the time the test is performed. You may also have other tests, such as: °· Transthoracic echocardiogram (TTE). During echocardiography, sound waves are used to create a picture of all of the heart structures and to look at how blood flows through your heart. °· Transesophageal echocardiogram (TEE). This is a more advanced imaging test that obtains images from inside your body. It allows your health care provider to see your heart in finer detail. °· Cardiac monitoring. This allows your health care provider to monitor your heart rate and rhythm in real time. °· Holter monitor. This is a portable device that records your heartbeat and can help to diagnose abnormal heartbeats. It allows your health care provider to track your heart activity for several days, if needed. °· Stress tests. These can be done through exercise or by taking medicine that makes your heart beat more quickly. °· Blood tests. °· Imaging tests. °TREATMENT  °Your treatment depends on what is causing your chest pain. Treatment may include: °· Medicines. These may include: °· Acid blockers for heartburn. °· Anti-inflammatory medicine. °· Pain medicine for inflammatory conditions. °· Antibiotic medicine, if an infection is present. °· Medicines to dissolve blood clots. °· Medicines to treat coronary artery disease. °· Supportive care for conditions that do not require medicines. This may include: °· Resting. °· Applying heat   or cold packs to injured areas. °· Limiting activities until pain decreases. °HOME CARE INSTRUCTIONS °· If you were prescribed an antibiotic medicine, finish it all even if you start to feel better. °· Avoid any activities that bring on chest pain. °· Do not use any tobacco products, including  cigarettes, chewing tobacco, or electronic cigarettes. If you need help quitting, ask your health care provider. °· Do not drink alcohol. °· Take medicines only as directed by your health care provider. °· Keep all follow-up visits as directed by your health care provider. This is important. This includes any further testing if your chest pain does not go away. °· If heartburn is the cause for your chest pain, you may be told to keep your head raised (elevated) while sleeping. This reduces the chance that acid will go from your stomach into your esophagus. °· Make lifestyle changes as directed by your health care provider. These may include: °· Getting regular exercise. Ask your health care provider to suggest some activities that are safe for you. °· Eating a heart-healthy diet. A registered dietitian can help you to learn healthy eating options. °· Maintaining a healthy weight. °· Managing diabetes, if necessary. °· Reducing stress. °SEEK MEDICAL CARE IF: °· Your chest pain does not go away after treatment. °· You have a rash with blisters on your chest. °· You have a fever. °SEEK IMMEDIATE MEDICAL CARE IF:  °· Your chest pain is worse. °· You have an increasing cough, or you cough up blood. °· You have severe abdominal pain. °· You have severe weakness. °· You faint. °· You have chills. °· You have sudden, unexplained chest discomfort. °· You have sudden, unexplained discomfort in your arms, back, neck, or jaw. °· You have shortness of breath at any time. °· You suddenly start to sweat, or your skin gets clammy. °· You feel nauseous or you vomit. °· You suddenly feel light-headed or dizzy. °· Your heart begins to beat quickly, or it feels like it is skipping beats. °These symptoms may represent a serious problem that is an emergency. Do not wait to see if the symptoms will go away. Get medical help right away. Call your local emergency services (911 in the [place] Do not drive yourself to the hospital. °  °This  information is not intended to replace advice given to you by your health care provider. Make sure you discuss any questions you have with your health care provider. °  °Document Released: 05/31/2005 Document Revised: 09/11/2014 Document Reviewed: 03/27/2014 °Elsevier Interactive Patient Education ©2016 [employer] ° °Shortness of Breath °Shortness of breath means you have trouble breathing. It could also mean that you have a medical problem. You should get immediate medical care for shortness of breath. °CAUSES  °· Not enough oxygen in the air such as with high altitudes or a smoke-filled room. °· Certain lung diseases, infections, or problems. °· Heart disease or conditions, such as angina or heart failure. °· Low red blood cells (anemia). °· Poor physical fitness, which can cause shortness of breath when you exercise. °· Chest or back injuries or stiffness. °· Being overweight. °· Smoking. °· Anxiety, which can make you feel like you are not getting enough air. °DIAGNOSIS  °Serious medical problems can often be found during your physical exam. Tests may also be done to determine why you are having shortness of breath. Tests may include: °· Chest X-rays. °· Lung function tests. °· Blood tests. °· An electrocardiogram (ECG). °· An ambulatory   electrocardiogram. An ambulatory ECG records your heartbeat patterns over a 24-hour period. °· Exercise testing. °· A transthoracic echocardiogram (TTE). During echocardiography, sound waves are used to evaluate how blood flows through your heart. °· A transesophageal echocardiogram (TEE). °· Imaging scans. °Your health care provider may not be able to find a cause for your shortness of breath after your exam. In this case, it is important to have a follow-up exam with your health care provider as directed.  °TREATMENT  °Treatment for shortness of breath depends on the cause of your symptoms and can vary greatly. °HOME CARE INSTRUCTIONS  °· Do not smoke. Smoking is a common  cause of shortness of breath. If you smoke, ask for help to quit. °· Avoid being around chemicals or things that may bother your breathing, such as paint fumes and dust. °· Rest as needed. Slowly resume your usual activities. °· If medicines were prescribed, take them as directed for the full length of time directed. This includes oxygen and any inhaled medicines. °· Keep all follow-up appointments as directed by your health care provider. °SEEK MEDICAL CARE IF:  °· Your condition does not improve in the time expected. °· You have a hard time doing your normal activities even with rest. °· You have any new symptoms. °SEEK IMMEDIATE MEDICAL CARE IF:  °· Your shortness of breath gets worse. °· You feel light-headed, faint, or develop a cough not controlled with medicines. °· You start coughing up blood. °· You have pain with breathing. °· You have chest pain or pain in your arms, shoulders, or abdomen. °· You have a fever. °· You are unable to walk up stairs or exercise the way you normally do. °MAKE SURE YOU: °· Understand these instructions. °· Will watch your condition. °· Will get help right away if you are not doing well or get worse. °  °This information is not intended to replace advice given to you by your health care provider. Make sure you discuss any questions you have with your health care provider. °  °Document Released: 05/16/2001 Document Revised: 08/26/2013 Document Reviewed: 11/06/2011 °Elsevier Interactive Patient Education ©2016 [employer]vier Inc. ° °

## 2015-08-30 NOTE — ED Provider Notes (Signed)
CSN: [id number]     Arrival date & time 08/29/15  2032 History   First MD Initiated Contact with Patient 08/29/15 2352     Chief Complaint  Patient presents with  . Chest Pain    Sharon Barnett is a 24 y.o. female who is otherwise healthy presents to the emergency department complaining of intermittent chest pain for the past year that has returned today. She reports she's been having left-sided chest pain since 7 AM this morning. She reports associated shortness of breath due to pain with deep inspiration. She reports her pain is also worse with movement. She currently complains of 8 out of 10 pain. She has taken nothing for treatment of her pain today. Patient also reports postnasal drip and runny nose. The patient denies personal or close family history of PEs or DVTs. She denies personal or close family history of MIs. She denies recent long travel. She is not a smoker. She does not use endogenous estrogens. She denies fevers, chills, hemoptysis, recent surgery, coughing, wheezing, abdominal pain, nausea, vomiting, diarrhea, leg pain, leg swelling, or rashes.  (Consider location/radiation/quality/duration/timing/severity/associated sxs/prior Treatment) HPI  Past Medical History  Diagnosis Date  . Boils   . ADHD (attention deficit hyperactivity disorder)    History reviewed. No pertinent past surgical history. No family history on file. Social History  Substance Use Topics  . Smoking status: Never Smoker   . Smokeless tobacco: None  . Alcohol Use: No   OB History    No data available     Review of Systems  Constitutional: Negative for fever and chills.  HENT: Negative for congestion and sore throat.   Eyes: Negative for visual disturbance.  Respiratory: Positive for shortness of breath. Negative for cough, chest tightness and wheezing.   Cardiovascular: Positive for chest pain. Negative for palpitations and leg swelling.  Gastrointestinal: Negative for nausea, vomiting,  abdominal pain and diarrhea.  Genitourinary: Negative for dysuria.  Musculoskeletal: Negative for back pain and neck pain.  Skin: Negative for rash.  Neurological: Negative for dizziness, syncope, light-headedness and headaches.      Allergies  Review of patient's allergies indicates no known allergies.  Home Medications   Prior to Admission medications   Medication Sig Start Date End Date Taking? Authorizing Provider  ibuprofen (ADVIL,MOTRIN) 200 MG tablet Take 400 mg by mouth every 6 (six) hours as needed for moderate pain.    Yes Historical Provider, MD  naproxen (NAPROSYN) 250 MG tablet Take 1 tablet (250 mg total) by mouth 2 (two) times daily with a meal. 08/30/15   Everlene Farrier, PA-C   BP 116/74 mmHg  Pulse 95  Temp(Src) 98.5 F (36.9 C) (Oral)  Resp 16  SpO2 98%  LMP 08/15/2015 Physical Exam  Constitutional: She is oriented to person, place, and time. She appears well-developed and well-nourished. No distress.  Nontoxic appearing.  HENT:  Head: Normocephalic and atraumatic.  Mouth/Throat: Oropharynx is clear and moist.  Eyes: Conjunctivae are normal. Pupils are equal, round, and reactive to light. Right eye exhibits no discharge. Left eye exhibits no discharge.  Neck: Normal range of motion. Neck supple. No JVD present. No tracheal deviation present.  Cardiovascular: Normal rate, regular rhythm, normal heart sounds and intact distal pulses.  Exam reveals no gallop and no friction rub.   No murmur heard. Bilateral radial and posterior tibialis pulses are intact.  Pulmonary/Chest: Effort normal and breath sounds normal. No respiratory distress. She has no wheezes. She has no rales. She exhibits tenderness.  Lungs are clear to auscultation bilaterally. Left-sided anterior chest wall is tender to palpation and reproduces her chest pain. No overlying skin changes. No rashes. Chest expansion is symmetric bilaterally.  Abdominal: Soft. She exhibits no distension. There is no  tenderness. There is no guarding.  Musculoskeletal: Normal range of motion. She exhibits no edema or tenderness.  No lower extremity edema or tenderness.  Lymphadenopathy:    She has no cervical adenopathy.  Neurological: She is alert and oriented to person, place, and time. Coordination normal.  Sensation is intact to her bilateral upper and lower extremities.  Skin: Skin is warm and dry. No rash noted. She is not diaphoretic. No erythema. No pallor.  Psychiatric: She has a normal mood and affect. Her behavior is normal.  Nursing note and vitals reviewed.   ED Course  Procedures (including critical care time) Labs Review Labs Reviewed  BASIC METABOLIC PANEL - Abnormal; Notable for the following:    Glucose, Bld 100 (*)    All other components within normal limits  CBC - Abnormal; Notable for the following:    Hemoglobin 11.5 (*)    HCT 34.6 (*)    All other components within normal limits  I-STAT TROPOININ, ED    Imaging Review Dg Chest 2 View  08/29/2015  CLINICAL DATA:  Chest pain EXAM: CHEST  2 VIEW COMPARISON:  08/21/2013 FINDINGS: The heart size and mediastinal contours are within normal limits. Both lungs are clear. The visualized skeletal structures are unremarkable. IMPRESSION: No active cardiopulmonary disease. Electronically Signed   By: Marlan Palau M.D.   On: 08/29/2015 21:23   I have personally reviewed and evaluated these images and lab results as part of my medical decision-making.   EKG Interpretation   Date/Time:  Sunday August 29 2015 20:36:22 EST Ventricular Rate:  90 PR Interval:  142 QRS Duration: 72 QT Interval:  326 QTC Calculation: 398 R Axis:   92 Text Interpretation:  Poor data quality, interpretation may be  adversely affected Normal sinus rhythm Rightward axis Borderline ECG  Confirmed by HORTON  MD, COURTNEY ([id number]) on 08/29/2015 11:54:07 PM  ED repeat ECG REPORT   Date: 08/30/2015  Rate: 70  Rhythm: normal sinus rhythm  QRS Axis:  normal  Intervals: normal  ST/T Wave abnormalities: normal  Conduction Disutrbances:none  Narrative Interpretation:   Old EKG Reviewed: unchanged  I have personally reviewed the EKG tracing and agree with the computerized printout as noted.     Filed Vitals:   08/29/15 2038 08/29/15 2344  BP: 138/78 116/74  Pulse: 92 95  Temp: 98.5 F (36.9 C)   TempSrc: Oral   Resp: 14 16  SpO2: 100% 98%     MDM   Meds given in ED:  Medications  acetaminophen (TYLENOL) tablet 650 mg (not administered)    New Prescriptions   NAPROXEN (NAPROSYN) 250 MG TABLET    Take 1 tablet (250 mg total) by mouth 2 (two) times daily with a meal.    Final diagnoses:  Chest wall pain   This  is a 24 y.o. female who is otherwise healthy presents to the emergency department complaining of intermittent chest pain for the past year that has returned today. She reports she's been having left-sided chest pain since 7 AM this morning. She reports associated shortness of breath due to pain with deep inspiration. She reports her pain is also worse with movement. She currently complains of 8 out of 10 pain. She has taken nothing for treatment of  her pain today.  Patient presented with chest pain to the ED. Patient is to be discharged with recommendation to follow up with PCP in regards to today's hospital visit. Chest pain is not likely of cardiac or pulmonary etiology due to presentation, perc negative, VSS, no tracheal deviation, no JVD or new murmur, RRR, breath sounds equal bilaterally, EKG without acute abnormalities, negative troponin, and negative CXR. Patient's chest wall is tender to palpation and reproduces her chest pain. Patient has been advised to return to the ED if chest pain becomes exertional, associated with diaphoresis or nausea, radiates to left jaw/arm, worsens or becomes concerning in any way. Patient appears reliable for follow up and is agreeable to discharge.  Will discharge with Naprosyn. I advised  the patient to follow-up with their primary care provider this week. I advised the patient to return to the emergency department with new or worsening symptoms or new concerns. The patient verbalized understanding and agreement with plan.        Everlene FarrierWilliam Arnisha Laffoon, PA-C 08/30/15 0028  Shon Batonourtney F Horton, MD 08/30/15 [phone]

## 2015-11-16 ENCOUNTER — Inpatient Hospital Stay (HOSPITAL_COMMUNITY): Payer: BLUE CROSS/BLUE SHIELD

## 2015-11-16 ENCOUNTER — Encounter (HOSPITAL_COMMUNITY): Payer: Self-pay | Admitting: *Deleted

## 2015-11-16 ENCOUNTER — Inpatient Hospital Stay (HOSPITAL_COMMUNITY)
Admission: AD | Admit: 2015-11-16 | Discharge: 2015-11-17 | Disposition: A | Discharge status: Home / Self Care | Payer: BLUE CROSS/BLUE SHIELD | Source: Ambulatory Visit | Attending: Obstetrics & Gynecology | Admitting: Obstetrics & Gynecology

## 2015-11-16 DIAGNOSIS — O9989 Other specified diseases and conditions complicating pregnancy, childbirth and the puerperium: Secondary | ICD-10-CM | POA: Diagnosis not present

## 2015-11-16 DIAGNOSIS — O26899 Other specified pregnancy related conditions, unspecified trimester: Secondary | ICD-10-CM

## 2015-11-16 DIAGNOSIS — Z3A01 Less than 8 weeks gestation of pregnancy: Secondary | ICD-10-CM | POA: Diagnosis not present

## 2015-11-16 DIAGNOSIS — O3680X0 Pregnancy with inconclusive fetal viability, not applicable or unspecified: Secondary | ICD-10-CM

## 2015-11-16 DIAGNOSIS — O26891 Other specified pregnancy related conditions, first trimester: Secondary | ICD-10-CM | POA: Insufficient documentation

## 2015-11-16 DIAGNOSIS — R109 Unspecified abdominal pain: Secondary | ICD-10-CM | POA: Diagnosis not present

## 2015-11-16 DIAGNOSIS — R1030 Lower abdominal pain, unspecified: Secondary | ICD-10-CM | POA: Insufficient documentation

## 2015-11-16 LAB — CBC WITH DIFFERENTIAL/PLATELET
BASOS ABS: 0 10*3/uL (ref 0.0–0.1)
Basophils Relative: 0 %
Eosinophils Absolute: 0.1 10*3/uL (ref 0.0–0.7)
Eosinophils Relative: 1 %
HEMATOCRIT: 31.9 % — AB (ref 36.0–46.0)
HEMOGLOBIN: 10.8 g/dL — AB (ref 12.0–15.0)
LYMPHS ABS: 3 10*3/uL (ref 0.7–4.0)
LYMPHS PCT: 32 %
MCH: 29.4 pg (ref 26.0–34.0)
MCHC: 33.9 g/dL (ref 30.0–36.0)
MCV: 86.9 fL (ref 78.0–100.0)
Monocytes Absolute: 0.3 10*3/uL (ref 0.1–1.0)
Monocytes Relative: 3 %
NEUTROS ABS: 6 10*3/uL (ref 1.7–7.7)
Neutrophils Relative %: 64 %
Platelets: 300 10*3/uL (ref 150–400)
RBC: 3.67 MIL/uL — AB (ref 3.87–5.11)
RDW: 12.8 % (ref 11.5–15.5)
WBC: 9.4 10*3/uL (ref 4.0–10.5)

## 2015-11-16 LAB — URINALYSIS, ROUTINE W REFLEX MICROSCOPIC
Bilirubin Urine: NEGATIVE
Glucose, UA: NEGATIVE mg/dL
Hgb urine dipstick: NEGATIVE
Ketones, ur: NEGATIVE mg/dL
LEUKOCYTES UA: NEGATIVE
Nitrite: NEGATIVE
Protein, ur: NEGATIVE mg/dL
SPECIFIC GRAVITY, URINE: 1.02 (ref 1.005–1.030)
pH: 6.5 (ref 5.0–8.0)

## 2015-11-16 NOTE — MAU Provider Note (Signed)
History     CSN: [id number]  Arrival date and time: 11/16/15 2221   First Provider Initiated Contact with Patient 11/16/15 2334      Chief Complaint  Patient presents with  . Abdominal Cramping   HPI [name] is a 25 y.o. G1P0000 at [redacted]w[redacted]d who presents to MAU today with complaint of lower abdominal pain at midline. She states pain is 5/10 off and on x 1 week. She has not taken anything for pain. She had recent +HPT. She denies vaginal bleeding, but has had a small amount of white discharge. She denies UTI symptoms, N/V/D or fever.  OB History    Gravida Para Term Preterm AB TAB SAB Ectopic Multiple Living        Past Medical History  Diagnosis Date  . Boils   . ADHD (attention deficit hyperactivity disorder)     History reviewed. No pertinent past surgical history.  History reviewed. No pertinent family history.  Social History  Substance Use Topics  . Smoking status: Never Smoker   . Smokeless tobacco: None  . Alcohol Use: No    Allergies: No Known Allergies  No prescriptions prior to admission    Review of Systems  Constitutional: Negative for fever and malaise/fatigue.  Gastrointestinal: Positive for abdominal pain. Negative for nausea, vomiting, diarrhea and constipation.  Genitourinary: Negative for dysuria, urgency and frequency.       + vaginal discharge Neg - vaginal bleeding   Physical Exam   Blood pressure 102/56, pulse 88, temperature 98.3 F (36.8 C), temperature source Oral, height  (1.727 m), weight 124 lb 8 oz (56.473 kg), last menstrual period 10/20/2015.  Physical Exam  Nursing note and vitals reviewed. Constitutional: She is oriented to person, place, and time. She appears well-developed and well-nourished. No distress.  HENT:  Head: Normocephalic and atraumatic.  Cardiovascular: Normal rate.   Respiratory: Effort normal.  GI: Soft. She exhibits no distension and no mass. There is no tenderness. There  is no rebound and no guarding.  Genitourinary: Uterus is not enlarged and not tender. Right adnexum displays no mass and no tenderness. Left adnexum displays no mass and no tenderness. No bleeding in the vagina. No vaginal discharge found.  Neurological: She is alert and oriented to person, place, and time.  Skin: Skin is warm and dry. No erythema.  Psychiatric: She has a normal mood and affect.     Results for orders placed or performed during the hospital encounter of 11/16/15 (from the past 24 hour(s))  Urinalysis, Routine w reflex microscopic (not at [facility])     Status: None   Collection Time: 11/16/15 11:09 PM  Result Value Ref Range   Color, Urine YELLOW YELLOW   APPearance CLEAR CLEAR   Specific Gravity, Urine 1.020 1.005 - 1.030   pH 6.5 5.0 - 8.0   Glucose, UA NEGATIVE NEGATIVE mg/dL   Hgb urine dipstick NEGATIVE NEGATIVE   Bilirubin Urine NEGATIVE NEGATIVE   Ketones, ur NEGATIVE NEGATIVE mg/dL   Protein, ur NEGATIVE NEGATIVE mg/dL   Nitrite NEGATIVE NEGATIVE   Leukocytes, UA NEGATIVE NEGATIVE  Wet prep, genital     Status: Abnormal   Collection Time: 11/16/15 11:40 PM  Result Value Ref Range   Yeast Wet Prep HPF POC NONE SEEN NONE SEEN   Trich, Wet Prep NONE SEEN NONE SEEN   Clue Cells Wet Prep HPF POC NONE SEEN NONE SEEN   WBC, Wet  Prep HPF POC MODERATE (A) NONE SEEN   Sperm NONE SEEN   CBC with Differential/Platelet     Status: Abnormal   Collection Time: 11/16/15 11:45 PM  Result Value Ref Range   WBC 9.4 4.0 - 10.5 K/uL   RBC 3.67 (L) 3.87 - 5.11 MIL/uL   Hemoglobin 10.8 (L) 12.0 - 15.0 g/dL   HCT 16.131.9 (L) 09.636.0 - 04.546.0 %   MCV 86.9 78.0 - 100.0 fL   MCH 29.4 26.0 - 34.0 pg   MCHC 33.9 30.0 - 36.0 g/dL   RDW 40.912.8 81.111.5 - 91.415.5 %   Platelets 300 150 - 400 K/uL   Neutrophils Relative % 64 %   Neutro Abs 6.0 1.7 - 7.7 K/uL   Lymphocytes Relative 32 %   Lymphs Abs 3.0 0.7 - 4.0 K/uL   Monocytes Relative 3 %   Monocytes Absolute 0.3 0.1 - 1.0 K/uL   Eosinophils  Relative 1 %   Eosinophils Absolute 0.1 0.0 - 0.7 K/uL   Basophils Relative 0 %   Basophils Absolute 0.0 0.0 - 0.1 K/uL  ABO/Rh     Status: None (Preliminary result)   Collection Time: 11/16/15 11:45 PM  Result Value Ref Range   ABO/RH(D) A POS   hCG, quantitative, pregnancy     Status: Abnormal   Collection Time: 11/16/15 11:45 PM  Result Value Ref Range   hCG, Beta Chain, Quant, S 1047 (H) <5 mIU/mL   [place] Ob Transvaginal  11/17/2015  CLINICAL DATA:  Abdominal pain in first-trimester pregnancy. EXAM: OBSTETRIC <14 WK ULTRASOUND TECHNIQUE: Transabdominal ultrasound was performed for evaluation of the gestation as well as the maternal uterus and adnexal regions. COMPARISON:  None. FINDINGS: Intrauterine gestational sac: Likely present Yolk sac:  Not yet seen Embryo:  Not yet seen MSD: 3  mm   5 w   0  d Subchorionic hemorrhage:  None visualized. Maternal uterus/adnexae: Corpus luteum seen on the left. IMPRESSION: Probable early intrauterine gestational sac, but no yolk sac or fetal pole visualized. Recommend follow-up quantitative B-HCG levels and follow-up US in 14 days to confirm and assess viability. This recommendation follows SRU consensus guidelines: Diagnostic Criteria for Nonviable Pregnancy Early in the First Trimester. Malva Limes Engl J Med 2013; 782:9562-13; 369:1443-51. Electronically Signed   By: Marnee SpringJonathon  Watts M.D.   On: 11/17/2015 00:21    MAU Course  Procedures None  MDM +UPT UA, wet prep, GC/chlamydia, CBC, ABO/Rh, quant hCG, HIV, RPR and US today to rule out ectopic pregnancy  Assessment and Plan  A: Pregnancy of unknown location Abdominal pain in pregnancy  P: Discharge home Ectopic precautions discussed Patient advised of need for follow-up on Friday. [facility], will present to MAU before 8:00 am. Patient will not be able to wait for results in MAU, I have advised her that this is ok as long as she does not have any change or worsening of symptoms.   Patient may return to MAU as needed or sooner if her condition were to change or worsen   Marny LowensteinJulie N Wenzel, PA-C  11/17/2015, 1:27 AM

## 2015-11-16 NOTE — MAU Note (Signed)
DID HPT-  POSITIVE-  TONIGHT .   NO BIRTH  CONTROL.  LAST SEX-   2-24.    HAS APPOINTMENT  ON 3-31  WITH [facility]-  FOR  PHYSICAL.      HAS HAD  CRAMPS  X2 WEEKS.-    NO MEDS   FOR  PAIN.

## 2015-11-17 DIAGNOSIS — R109 Unspecified abdominal pain: Secondary | ICD-10-CM | POA: Diagnosis not present

## 2015-11-17 DIAGNOSIS — O9989 Other specified diseases and conditions complicating pregnancy, childbirth and the puerperium: Secondary | ICD-10-CM

## 2015-11-17 LAB — WET PREP, GENITAL
CLUE CELLS WET PREP: NONE SEEN
Sperm: NONE SEEN
Trich, Wet Prep: NONE SEEN
Yeast Wet Prep HPF POC: NONE SEEN

## 2015-11-17 LAB — RPR: RPR Ser Ql: NONREACTIVE

## 2015-11-17 LAB — HIV ANTIBODY (ROUTINE TESTING W REFLEX): HIV SCREEN 4TH GENERATION: NONREACTIVE

## 2015-11-17 LAB — HCG, QUANTITATIVE, PREGNANCY: HCG, BETA CHAIN, QUANT, S: 1047 m[IU]/mL — AB (ref <5)

## 2015-11-17 LAB — GC/CHLAMYDIA PROBE AMP (~~LOC~~) NOT AT ARMC
Chlamydia: POSITIVE — AB
Neisseria Gonorrhea: NEGATIVE

## 2015-11-17 LAB — ABO/RH: ABO/RH(D): A POS

## 2015-11-17 NOTE — Discharge Instructions (Signed)

## 2015-11-23 ENCOUNTER — Other Ambulatory Visit: Payer: Self-pay | Admitting: Family Medicine

## 2015-11-23 MED ORDER — AZITHROMYCIN 500 MG PO TABS: 1000.0000 mg | ORAL_TABLET | Freq: Once | ORAL | Status: DC

## 2015-11-24 ENCOUNTER — Other Ambulatory Visit: Payer: BLUE CROSS/BLUE SHIELD

## 2015-11-24 DIAGNOSIS — Z3481 Encounter for supervision of other normal pregnancy, first trimester: Secondary | ICD-10-CM

## 2015-11-24 NOTE — Progress Notes (Signed)
New ob labs done today Sharon Barnett 

## 2015-11-25 ENCOUNTER — Inpatient Hospital Stay (HOSPITAL_COMMUNITY): Payer: BLUE CROSS/BLUE SHIELD

## 2015-11-25 ENCOUNTER — Inpatient Hospital Stay (HOSPITAL_COMMUNITY)
Admission: AD | Admit: 2015-11-25 | Discharge: 2015-11-26 | Disposition: A | Discharge status: Home / Self Care | Payer: BLUE CROSS/BLUE SHIELD | Source: Ambulatory Visit | Attending: Obstetrics and Gynecology | Admitting: Obstetrics and Gynecology

## 2015-11-25 ENCOUNTER — Encounter (HOSPITAL_COMMUNITY): Payer: Self-pay

## 2015-11-25 DIAGNOSIS — O98311 Other infections with a predominantly sexual mode of transmission complicating pregnancy, first trimester: Secondary | ICD-10-CM | POA: Diagnosis not present

## 2015-11-25 DIAGNOSIS — N760 Acute vaginitis: Secondary | ICD-10-CM

## 2015-11-25 DIAGNOSIS — O26891 Other specified pregnancy related conditions, first trimester: Secondary | ICD-10-CM | POA: Diagnosis present

## 2015-11-25 DIAGNOSIS — Z3A01 Less than 8 weeks gestation of pregnancy: Secondary | ICD-10-CM | POA: Diagnosis not present

## 2015-11-25 DIAGNOSIS — B9689 Other specified bacterial agents as the cause of diseases classified elsewhere: Secondary | ICD-10-CM | POA: Diagnosis not present

## 2015-11-25 DIAGNOSIS — A499 Bacterial infection, unspecified: Secondary | ICD-10-CM | POA: Diagnosis not present

## 2015-11-25 DIAGNOSIS — O98811 Other maternal infectious and parasitic diseases complicating pregnancy, first trimester: Secondary | ICD-10-CM | POA: Diagnosis not present

## 2015-11-25 DIAGNOSIS — A568 Sexually transmitted chlamydial infection of other sites: Secondary | ICD-10-CM

## 2015-11-25 DIAGNOSIS — O23591 Infection of other part of genital tract in pregnancy, first trimester: Secondary | ICD-10-CM

## 2015-11-25 DIAGNOSIS — A749 Chlamydial infection, unspecified: Secondary | ICD-10-CM

## 2015-11-25 LAB — CBC
HCT: 30.9 % — ABNORMAL LOW (ref 36.0–46.0)
Hemoglobin: 10.5 g/dL — ABNORMAL LOW (ref 12.0–15.0)
MCH: 29.3 pg (ref 26.0–34.0)
MCHC: 34 g/dL (ref 30.0–36.0)
MCV: 86.3 fL (ref 78.0–100.0)
Platelets: 332 10*3/uL (ref 150–400)
RBC: 3.58 MIL/uL — ABNORMAL LOW (ref 3.87–5.11)
RDW: 13.1 % (ref 11.5–15.5)
WBC: 9.7 10*3/uL (ref 4.0–10.5)

## 2015-11-25 LAB — OBSTETRIC PANEL
Antibody Screen: NEGATIVE
BASOS PCT: 0 % (ref 0–1)
Basophils Absolute: 0 10*3/uL (ref 0.0–0.1)
EOS ABS: 0.1 10*3/uL (ref 0.0–0.7)
EOS PCT: 1 % (ref 0–5)
HCT: 33.9 % — ABNORMAL LOW (ref 36.0–46.0)
Hemoglobin: 11.3 g/dL — ABNORMAL LOW (ref 12.0–15.0)
Hepatitis B Surface Ag: NEGATIVE
LYMPHS ABS: 2.4 10*3/uL (ref 0.7–4.0)
Lymphocytes Relative: 31 % (ref 12–46)
MCH: 28.9 pg (ref 26.0–34.0)
MCHC: 33.3 g/dL (ref 30.0–36.0)
MCV: 86.7 fL (ref 78.0–100.0)
MONOS PCT: 4 % (ref 3–12)
MPV: 9.5 fL (ref 8.6–12.4)
Monocytes Absolute: 0.3 10*3/uL (ref 0.1–1.0)
Neutro Abs: 4.9 10*3/uL (ref 1.7–7.7)
Neutrophils Relative %: 64 % (ref 43–77)
PLATELETS: 341 10*3/uL (ref 150–400)
RBC: 3.91 MIL/uL (ref 3.87–5.11)
RDW: 13 % (ref 11.5–15.5)
RH TYPE: POSITIVE
WBC: 7.6 10*3/uL (ref 4.0–10.5)

## 2015-11-25 LAB — HIV ANTIBODY (ROUTINE TESTING W REFLEX): HIV: NONREACTIVE

## 2015-11-25 LAB — URINALYSIS, ROUTINE W REFLEX MICROSCOPIC
BILIRUBIN URINE: NEGATIVE
GLUCOSE, UA: NEGATIVE mg/dL
HGB URINE DIPSTICK: NEGATIVE
Ketones, ur: NEGATIVE mg/dL
Nitrite: NEGATIVE
Protein, ur: NEGATIVE mg/dL
SPECIFIC GRAVITY, URINE: 1.02 (ref 1.005–1.030)
pH: 7 (ref 5.0–8.0)

## 2015-11-25 LAB — HCG, QUANTITATIVE, PREGNANCY: hCG, Beta Chain, Quant, S: 22242 m[IU]/mL — ABNORMAL HIGH (ref <5)

## 2015-11-25 LAB — WET PREP, GENITAL
SPERM: NONE SEEN
TRICH WET PREP: NONE SEEN
YEAST WET PREP: NONE SEEN

## 2015-11-25 LAB — URINE MICROSCOPIC-ADD ON

## 2015-11-25 LAB — SICKLE CELL SCREEN: SICKLE CELL SCREEN: NEGATIVE

## 2015-11-25 MED ORDER — AZITHROMYCIN 250 MG PO TABS
1000.0000 mg | ORAL_TABLET | Freq: Once | ORAL | Status: AC
  Administered 2015-11-25: 1000 mg via ORAL
  Filled 2015-11-25: qty 4

## 2015-11-25 NOTE — MAU Provider Note (Signed)
History     CSN: [id number]  Arrival date and time: 11/25/15 2131   First Provider Initiated Contact with Patient 11/25/15 2337      Chief Complaint  Patient presents with  . Pelvic Pain   Pelvic Pain The patient's primary symptoms include pelvic pain. This is a new problem. The current episode started 1 to 4 weeks ago. The problem occurs intermittently. The problem has been gradually worsening. Pain severity now: 8/10  The problem affects both sides. She is pregnant. Associated symptoms include abdominal pain. Pertinent negatives include no chills, constipation, diarrhea, dysuria, fever, frequency, nausea, urgency or vomiting. The vaginal discharge was malodorous. There has been no bleeding. Nothing aggravates the symptoms. She has tried nothing for the symptoms. She is sexually active. Menstrual history: LMP 10/20/15     Past Medical History  Diagnosis Date  . Boils   . ADHD (attention deficit hyperactivity disorder)     Past Surgical History  Procedure Laterality Date  . Wisdom tooth extraction      History reviewed. No pertinent family history.  Social History  Substance Use Topics  . Smoking status: Never Smoker   . Smokeless tobacco: None  . Alcohol Use: No    Allergies: No Known Allergies  Prescriptions prior to admission  Medication Sig Dispense Refill Last Dose  . azithromycin (ZITHROMAX) 500 MG tablet Take 2 tablets (1,000 mg total) by mouth once. 2 tablet 0     Review of Systems  Constitutional: Negative for fever and chills.  Gastrointestinal: Positive for abdominal pain. Negative for nausea, vomiting, diarrhea and constipation.  Genitourinary: Positive for pelvic pain. Negative for dysuria, urgency and frequency.   Physical Exam   Blood pressure 112/51, pulse 85, temperature 98.3 F (36.8 C), temperature source Oral, height 5\' 8"  (1.727 m), weight 55.452 kg (122 lb 4 oz), last menstrual period 10/20/2015, SpO2 100 %.  Physical Exam  Nursing note and  vitals reviewed. Constitutional: She is oriented to person, place, and time. She appears well-developed and well-nourished. No distress.  HENT:  Head: Normocephalic.  Cardiovascular: Normal rate.   Respiratory: Effort normal.  GI: Soft. There is no tenderness. There is no rebound.  Neurological: She is alert and oriented to person, place, and time.  Skin: Skin is warm and dry.  Psychiatric: She has a normal mood and affect.   Results for orders placed or performed during the hospital encounter of 11/25/15 (from the past 24 hour(s))  CBC     Status: Abnormal   Collection Time: 11/25/15  9:56 PM  Result Value Ref Range   WBC 9.7 4.0 - 10.5 K/uL   RBC 3.58 (L) 3.87 - 5.11 MIL/uL   Hemoglobin 10.5 (L) 12.0 - 15.0 g/dL   HCT 40.930.9 (L) 81.136.0 - 91.446.0 %   MCV 86.3 78.0 - 100.0 fL   MCH 29.3 26.0 - 34.0 pg   MCHC 34.0 30.0 - 36.0 g/dL   RDW 78.213.1 95.611.5 - 21.315.5 %   Platelets 332 150 - 400 K/uL  hCG, quantitative, pregnancy     Status: Abnormal   Collection Time: 11/25/15  9:59 PM  Result Value Ref Range   hCG, Beta Chain, Quant, S 22242 (H) <5 mIU/mL  Urinalysis, Routine w reflex microscopic (not at [facility])     Status: Abnormal   Collection Time: 11/25/15 10:15 PM  Result Value Ref Range   Color, Urine YELLOW YELLOW   APPearance CLEAR CLEAR   Specific Gravity, Urine 1.020 1.005 - 1.030   pH  7.0 5.0 - 8.0   Glucose, UA NEGATIVE NEGATIVE mg/dL   Hgb urine dipstick NEGATIVE NEGATIVE   Bilirubin Urine NEGATIVE NEGATIVE   Ketones, ur NEGATIVE NEGATIVE mg/dL   Protein, ur NEGATIVE NEGATIVE mg/dL   Nitrite NEGATIVE NEGATIVE   Leukocytes, UA SMALL (A) NEGATIVE  Urine microscopic-add on     Status: Abnormal   Collection Time: 11/25/15 10:15 PM  Result Value Ref Range   Squamous Epithelial / LPF 0-5 (A) NONE SEEN   WBC, UA 0-5 0 - 5 WBC/hpf   RBC / HPF 0-5 0 - 5 RBC/hpf   Bacteria, UA FEW (A) NONE SEEN  Wet prep, genital     Status: Abnormal   Collection Time: 11/25/15 11:34 PM  Result Value  Ref Range   Yeast Wet Prep HPF POC NONE SEEN NONE SEEN   Trich, Wet Prep NONE SEEN NONE SEEN   Clue Cells Wet Prep HPF POC PRESENT (A) NONE SEEN   WBC, Wet Prep HPF POC MODERATE (A) NONE SEEN   Sperm NONE SEEN    US Ob Transvaginal  11/25/2015  CLINICAL DATA:  Left-sided pelvic pain. Estimated gestational age by LMP is 5 weeks 1 day. Quantitative beta HCG is in progress. EXAM: TRANSVAGINAL OB ULTRASOUND TECHNIQUE: Transvaginal ultrasound was performed for complete evaluation of the gestation as well as the maternal uterus, adnexal regions, and pelvic cul-de-sac. COMPARISON:  11/16/2015 FINDINGS: Intrauterine gestational sac: A single intrauterine gestational sac is identified. Yolk sac:  Yolk sac is present. Embryo:  Fetal pole is not identified. Cardiac Activity: Not identified. MSD: 13.9  Mm   6 w   2  d Subchorionic hemorrhage: Small subchorionic hemorrhage is suggested posteriorly. Maternal uterus/adnexae: Uterus is anteverted. No myometrial mass lesions identified. Small nabothian cysts in the cervix. Both ovaries are visualized and appear normal. No abnormal adnexal masses. Corpus luteal cyst on the left ovary. Small amount of free fluid in the pelvis. IMPRESSION: Probable early intrauterine gestational sac with yolk sac, but no fetal pole or cardiac activity yet visualized. Recommend follow-up quantitative B-HCG levels and follow-up US in 14 days to confirm and assess viability. This recommendation follows SRU consensus guidelines: Diagnostic Criteria for Nonviable Pregnancy Early in the First Trimester. Malva Limes Med 2013; 161:0960-45. Yolk sac is visualized today, suggesting progression since the previous study. Electronically Signed   By: Burman Nieves M.D.   On: 11/25/2015 23:08    MAU Course  Procedures  MDM Treated here in MAU with 1,000 mg azithromycin   Patient was at [facility] on 11/24/15. She had new OB labs drawn, but was not treated for +chalmydia at that time. On  review it looks like RX was sent to patient's pharmacy.   Assessment and Plan   1. BV (bacterial vaginosis)   2. Chlamydia   3. [redacted] weeks gestation of pregnancy    DC home Comfort measures reviewed  1st Trimester precautions  RX: flagyl  BID X7 days  Return to MAU as needed FU with OB as planned  Follow-up Information    Follow up with [facility].   Specialty:  Family Medicine   Why:  As scheduled   Contact information:   [address] [id number] mc [place] [address] [phone]        Tawnya Crook 11/25/2015, 11:38 PM

## 2015-11-25 NOTE — MAU Note (Signed)
Pt reports she has been cramping off/on since positive preg test but it worsened today. Denies bleeding.

## 2015-11-26 MED ORDER — METRONIDAZOLE 500 MG PO TABS: 500.0000 mg | ORAL_TABLET | Freq: Two times a day (BID) | ORAL | Status: DC

## 2015-11-26 NOTE — Discharge Instructions (Signed)
Chlamydia, Female Chlamydia is an infection. It is spread from one person to another person during sexual contact. This infection can be in the cervix, urine tube (urethra), throat, or bottom (rectum). This infection needs treatment. HOME CARE   Take your medicines (antibiotics) as told. Finish them even if you start to feel better.  Only take medicine as told by your doctor.  Tell your sex partner(s) that you have chlamydia. They must also be treated.  Do not have sex until your doctor says it is okay.  Rest.  Eat healthy. Drink enough fluids to keep your pee (urine) clear or pale yellow.  Keep all doctor visits as told. GET HELP IF:  You have pain when you pee.  You have belly pain.  You have vaginal discharge.  You have pain during sex.  You have bleeding between periods and after sex.  You have a fever. GET HELP RIGHT AWAY IF:   You feel sick to your stomach (nauseous) or you throw up (vomit).  You sweat much more than normal (diaphoresis).  You have trouble swallowing.   This information is not intended to replace advice given to you by your health care provider. Make sure you discuss any questions you have with your health care provider.   Document Released: 05/30/2008 Document Revised: 05/12/2015 Document Reviewed: 04/28/2013 Elsevier Interactive Patient Education 2016 Elsevier Inc.  Bacterial Vaginosis Bacterial vaginosis is an infection of the vagina. It happens when too many germs (bacteria) grow in the vagina. Having this infection puts you at risk for getting other infections from sex. Treating this infection can help lower your risk for other infections, such as:   Chlamydia.  Gonorrhea.  HIV.  Herpes. HOME CARE  Take your medicine as told by your doctor.  Finish your medicine even if you start to feel better.  Tell your sex partner that you have an infection. They should see their doctor for treatment.  During treatment:  Avoid sex or use  condoms correctly.  Do not douche.  Do not drink alcohol unless your doctor tells you it is ok.  Do not breastfeed unless your doctor tells you it is ok. GET HELP IF:  You are not getting better after 3 days of treatment.  You have more grey fluid (discharge) coming from your vagina than before.  You have more pain than before.  You have a fever. MAKE SURE YOU:   Understand these instructions.  Will watch your condition.  Will get help right away if you are not doing well or get worse.   This information is not intended to replace advice given to you by your health care provider. Make sure you discuss any questions you have with your health care provider.   Document Released: 05/30/2008 Document Revised: 09/11/2014 Document Reviewed: 04/02/2013 Elsevier Interactive Patient Education 2016 [employer].   Safe Medications in Pregnancy   Acne: Benzoyl Peroxide Salicylic Acid  Backache/Headache: Tylenol: 2 regular strength every 4 hours OR              2 Extra strength every 6 hours  Colds/Coughs/Allergies: Benadryl (alcohol free) 25 mg every 6 hours as needed Breath right strips Claritin Cepacol throat lozenges Chloraseptic throat spray Cold-Eeze- up to three times per day Cough drops, alcohol free Flonase (by prescription only) Guaifenesin Mucinex Robitussin DM (plain only, alcohol free) Saline nasal spray/drops Sudafed (pseudoephedrine) & Actifed ** use only after [redacted] weeks gestation and if you do not have high blood pressure Tylenol Vicks Vaporub  Zinc lozenges Zyrtec   Constipation: Colace Ducolax suppositories Fleet enema Glycerin suppositories Metamucil Milk of magnesia Miralax Senokot Smooth move tea  Diarrhea: Kaopectate Imodium A-D  *NO pepto Bismol  Hemorrhoids: Anusol Anusol HC Preparation H Tucks  Indigestion: Tums Maalox Mylanta Zantac  Pepcid  Insomnia: Benadryl (alcohol free) 25mg  every 6 hours as needed Tylenol  PM Unisom, no Gelcaps  Leg Cramps: Tums MagGel  Nausea/Vomiting:  Bonine Dramamine Emetrol Ginger extract Sea bands Meclizine  Nausea medication to take during pregnancy:  Unisom (doxylamine succinate 25 mg tablets) Take one tablet daily at bedtime. If symptoms are not adequately controlled, the dose can be increased to a maximum recommended dose of two tablets daily (1/2 tablet in the morning, 1/2 tablet mid-afternoon and one at bedtime). Vitamin B6 100mg  tablets. Take one tablet twice a day (up to 200 mg per day).  Skin Rashes: Aveeno products Benadryl cream or 25mg  every 6 hours as needed Calamine Lotion 1% cortisone cream  Yeast infection: Gyne-lotrimin 7 Monistat 7   **If taking multiple medications, please check labels to avoid duplicating the same active ingredients **take medication as directed on the label ** Do not exceed 4000 mg of tylenol in 24 hours **Do not take medications that contain aspirin or ibuprofen

## 2015-11-30 ENCOUNTER — Telehealth: Payer: Self-pay | Admitting: *Deleted

## 2015-11-30 NOTE — Telephone Encounter (Signed)
Attempted to call pt multiple times and no answer or able to leave a voicemail. Per notes she was informed by health department to come and get treated by us. PCP informed that i have made multiple calls. Deseree Bruna PotterBlount, CMA

## 2015-11-30 NOTE — Telephone Encounter (Signed)
-----   Message from Ian D McKeag, MD sent at 11/23/2015  6:01 PM EDT ----- Ladies,  I have tried calling this patient multiple times w/o any luck. [facility] No one has informed her of this (to the best of my knowledge). Also, no one has TREATED this.   Patient has a LAB appointment on 3/21 (likely the day you read this) at 10:00am. I have placed an order for Azithromycin 1g to be picked up at her pharmacy. However, patient needs a dose of Ceftriaxone in the office tomorrow. Please do your best to make sure she gets this!  Thanks! 

## 2015-12-01 ENCOUNTER — Emergency Department (HOSPITAL_COMMUNITY)
Admission: EM | Admit: 2015-12-01 | Discharge: 2015-12-01 | Disposition: A | Discharge status: Home / Self Care | Payer: BLUE CROSS/BLUE SHIELD | Attending: Emergency Medicine | Admitting: Emergency Medicine

## 2015-12-01 ENCOUNTER — Telehealth: Payer: Self-pay | Admitting: *Deleted

## 2015-12-01 ENCOUNTER — Emergency Department (HOSPITAL_COMMUNITY): Payer: BLUE CROSS/BLUE SHIELD

## 2015-12-01 ENCOUNTER — Encounter: Payer: Self-pay | Admitting: Family Medicine

## 2015-12-01 ENCOUNTER — Encounter (HOSPITAL_COMMUNITY): Payer: Self-pay | Admitting: Emergency Medicine

## 2015-12-01 ENCOUNTER — Ambulatory Visit (INDEPENDENT_AMBULATORY_CARE_PROVIDER_SITE_OTHER): Payer: BLUE CROSS/BLUE SHIELD | Admitting: Family Medicine

## 2015-12-01 VITALS — BP 112/66 | HR 81 | Temp 98.3°F | Wt 114.4 lb

## 2015-12-01 DIAGNOSIS — O219 Vomiting of pregnancy, unspecified: Secondary | ICD-10-CM

## 2015-12-01 DIAGNOSIS — Z331 Pregnant state, incidental: Secondary | ICD-10-CM

## 2015-12-01 DIAGNOSIS — Z3490 Encounter for supervision of normal pregnancy, unspecified, unspecified trimester: Secondary | ICD-10-CM

## 2015-12-01 DIAGNOSIS — R112 Nausea with vomiting, unspecified: Secondary | ICD-10-CM

## 2015-12-01 DIAGNOSIS — O211 Hyperemesis gravidarum with metabolic disturbance: Secondary | ICD-10-CM | POA: Insufficient documentation

## 2015-12-01 DIAGNOSIS — O9989 Other specified diseases and conditions complicating pregnancy, childbirth and the puerperium: Secondary | ICD-10-CM | POA: Insufficient documentation

## 2015-12-01 DIAGNOSIS — Z3A01 Less than 8 weeks gestation of pregnancy: Secondary | ICD-10-CM | POA: Diagnosis not present

## 2015-12-01 DIAGNOSIS — A749 Chlamydial infection, unspecified: Secondary | ICD-10-CM

## 2015-12-01 DIAGNOSIS — R109 Unspecified abdominal pain: Secondary | ICD-10-CM

## 2015-12-01 DIAGNOSIS — Z8659 Personal history of other mental and behavioral disorders: Secondary | ICD-10-CM | POA: Insufficient documentation

## 2015-12-01 DIAGNOSIS — E860 Dehydration: Secondary | ICD-10-CM

## 2015-12-01 DIAGNOSIS — R1030 Lower abdominal pain, unspecified: Secondary | ICD-10-CM | POA: Insufficient documentation

## 2015-12-01 LAB — I-STAT BETA HCG BLOOD, ED (MC, WL, AP ONLY): I-stat hCG, quantitative: >2000 m[IU]/mL — ABNORMAL HIGH (ref <5)

## 2015-12-01 LAB — CBC
HEMATOCRIT: 35.2 % — AB (ref 36.0–46.0)
Hemoglobin: 12.1 g/dL (ref 12.0–15.0)
MCH: 30 pg (ref 26.0–34.0)
MCHC: 34.4 g/dL (ref 30.0–36.0)
MCV: 87.1 fL (ref 78.0–100.0)
PLATELETS: 328 10*3/uL (ref 150–400)
RBC: 4.04 MIL/uL (ref 3.87–5.11)
RDW: 13 % (ref 11.5–15.5)
WBC: 8.8 10*3/uL (ref 4.0–10.5)

## 2015-12-01 LAB — URINALYSIS, ROUTINE W REFLEX MICROSCOPIC
Bilirubin Urine: NEGATIVE
GLUCOSE, UA: NEGATIVE mg/dL
Hgb urine dipstick: NEGATIVE
Ketones, ur: >80 mg/dL — AB
LEUKOCYTES UA: NEGATIVE
NITRITE: NEGATIVE
PH: 6 (ref 5.0–8.0)
PROTEIN: NEGATIVE mg/dL
Specific Gravity, Urine: 1.03 (ref 1.005–1.030)

## 2015-12-01 LAB — COMPREHENSIVE METABOLIC PANEL
ALT: 16 U/L (ref 14–54)
AST: 19 U/L (ref 15–41)
Albumin: 3.9 g/dL (ref 3.5–5.0)
Alkaline Phosphatase: 41 U/L (ref 38–126)
Anion gap: 12 (ref 5–15)
BUN: 8 mg/dL (ref 6–20)
CHLORIDE: 107 mmol/L (ref 101–111)
CO2: 18 mmol/L — ABNORMAL LOW (ref 22–32)
CREATININE: 0.65 mg/dL (ref 0.44–1.00)
Calcium: 9.5 mg/dL (ref 8.9–10.3)
Glucose, Bld: 78 mg/dL (ref 65–99)
POTASSIUM: 4.1 mmol/L (ref 3.5–5.1)
SODIUM: 137 mmol/L (ref 135–145)
TOTAL PROTEIN: 7.3 g/dL (ref 6.5–8.1)
Total Bilirubin: 0.9 mg/dL (ref 0.3–1.2)

## 2015-12-01 LAB — LIPASE, BLOOD: Lipase: 20 U/L (ref 11–51)

## 2015-12-01 MED ORDER — ONDANSETRON HCL 4 MG/2ML IJ SOLN
4.0000 mg | Freq: Once | INTRAMUSCULAR | Status: AC
  Administered 2015-12-01: 4 mg via INTRAVENOUS
  Filled 2015-12-01: qty 2

## 2015-12-01 MED ORDER — CEFTRIAXONE SODIUM 1 G IJ SOLR
250.0000 mg | Freq: Once | INTRAMUSCULAR | Status: AC
  Administered 2015-12-01: 250 mg via INTRAMUSCULAR

## 2015-12-01 MED ORDER — ONDANSETRON HCL 4 MG PO TABS: 4.0000 mg | ORAL_TABLET | Freq: Four times a day (QID) | ORAL | Status: DC

## 2015-12-01 MED ORDER — SODIUM CHLORIDE 0.9 % IV BOLUS (SEPSIS)
1000.0000 mL | Freq: Once | INTRAVENOUS | Status: AC
  Administered 2015-12-01: 1000 mL via INTRAVENOUS

## 2015-12-01 NOTE — Assessment & Plan Note (Signed)
5 week ultrasound did not clearly see fetal pole.

## 2015-12-01 NOTE — Progress Notes (Signed)
   Subjective:    Patient ID: Sharon Barnett, female    DOB: [birth year], 25 y.o.   MRN: [id number]  HPI Patient with pregnancy (6 week) and now vomiting x 3 days.  Unable to keep fluids or solids down.  No abd pain.  No fever.  Also found to have chlamydia on recent MAU visit.  Not yet treated.  Patient has minimal abd pain.   Review of Systems     Objective:   Physical Exam Brief exam because important 8 lb wt loss was noted.  Abd benign.  I did not attempt to hear FHTs because of early gestation.        Assessment & Plan:

## 2015-12-01 NOTE — Assessment & Plan Note (Signed)
Doubt causing current symptoms.  We treated with rochephin.  Still needs azithro, which we did not give due to the vomiting.

## 2015-12-01 NOTE — Assessment & Plan Note (Signed)
Important 3 day duration and 8 lb weight loss.  Needs iv antiemetics and iv fluids.  She has transportation and is willing to go.  To ER (MAU is crowded.  Nurse was calling to find most prompt ER for treatment.)

## 2015-12-01 NOTE — ED Notes (Signed)
Pt transported to US

## 2015-12-01 NOTE — ED Provider Notes (Signed)
CSN: [id number]     Arrival date & time 12/01/15  1148 History   First MD Initiated Contact with Patient 12/01/15 1542     Chief Complaint  Patient presents with  . Emesis  . Abdominal Pain   (Consider location/radiation/quality/duration/timing/severity/associated sxs/prior Treatment) Patient is a 25 y.o. female presenting with vomiting and abdominal pain. The history is provided by the patient. No language interpreter was used.  Emesis Associated symptoms: abdominal pain   Abdominal Pain Associated symptoms: vomiting   Sharon Barnett is a 25 y.o female who is [redacted] weeks pregnant with no past medical history who presents with nausea and vomiting 3 days with lower abdominal pain. She reports she went to her PCPs office today and was told she had an 8 pound weight loss in one week. She was told to come to the ED. She has a follow-up appointment in 2 days to have fetal heart monitoring done.  He denies any recent illness, chest pain, shortness of breath, diarrhea, vaginal bleeding or discharge.  Past Medical History  Diagnosis Date  . Boils   . ADHD (attention deficit hyperactivity disorder)    Past Surgical History  Procedure Laterality Date  . Wisdom tooth extraction     No family history on file. Social History  Substance Use Topics  . Smoking status: Never Smoker   . Smokeless tobacco: None  . Alcohol Use: No   OB History    Gravida Para Term Preterm AB TAB SAB Ectopic Multiple Living   1 0 0 0 0 0 0 0 0 0      Review of Systems  Gastrointestinal: Positive for vomiting and abdominal pain.  All other systems reviewed and are negative.     Allergies  Review of patient's allergies indicates no known allergies.  Home Medications   Prior to Admission medications   Medication Sig Start Date End Date Taking? Authorizing Provider  metroNIDAZOLE (FLAGYL) 500 MG tablet Take 1 tablet (500 mg total) by mouth 2 (two) times daily. Patient not taking: Reported on 12/01/2015 11/25/15    Armando ReichertHeather D Hogan, CNM  ondansetron (ZOFRAN) 4 MG tablet Take 1 tablet (4 mg total) by mouth every 6 (six) hours. 12/01/15   Saladin Petrelli Patel-Mills, PA-C   BP 118/69 mmHg  Pulse 81  Temp(Src) 98.9 F (37.2 C) (Oral)  Resp 18  SpO2 100%  LMP 10/20/2015 Physical Exam  Constitutional: She is oriented to person, place, and time. She appears well-developed and well-nourished. No distress.  HENT:  Head: Normocephalic and atraumatic.  Eyes: Conjunctivae are normal.  Neck: Normal range of motion. Neck supple.  Cardiovascular: Normal rate, regular rhythm and normal heart sounds.   Pulmonary/Chest: Effort normal and breath sounds normal. No respiratory distress. She has no wheezes. She has no rales.  Abdominal: Soft. She exhibits no distension. There is tenderness in the suprapubic area.    Mild suprapubic tenderness to palpation. No guarding or rebound. No distention.  Musculoskeletal: Normal range of motion.  Neurological: She is alert and oriented to person, place, and time.  Skin: Skin is warm and dry.  Nursing note and vitals reviewed.   ED Course  Procedures (including critical care time) Labs Review Labs Reviewed  COMPREHENSIVE METABOLIC PANEL - Abnormal; Notable for the following:    CO2 18 (*)    All other components within normal limits  CBC - Abnormal; Notable for the following:    HCT 35.2 (*)    All other components within normal limits  URINALYSIS, ROUTINE W  REFLEX MICROSCOPIC (NOT AT [facility]) - Abnormal; Notable for the following:    Ketones, ur >80 (*)    All other components within normal limits  I-STAT BETA HCG BLOOD, ED ([facility], [facility], [facility] ONLY) - Abnormal; Notable for the following:    I-stat hCG, quantitative >2000.0 (*)    All other components within normal limits  LIPASE, BLOOD    Imaging Review US Ob Comp Less 14 Wks  12/01/2015  CLINICAL DATA:  Nausea and vomiting. EXAM: OBSTETRIC <14 WK [place] AND TRANSVAGINAL OB US TECHNIQUE: Both transabdominal and transvaginal ultrasound  examinations were performed for complete evaluation of the gestation as well as the maternal uterus, adnexal regions, and pelvic cul-de-sac. Transvaginal technique was performed to assess early pregnancy. COMPARISON:  None. FINDINGS: Intrauterine gestational sac: Visualized/normal in shape. Yolk sac:  Visualized. Embryo:  Visualized. Cardiac Activity: Visualized. Heart Rate: 124  bpm CRL:  8  mm   6 w   1 d                  [place] EDC: 07/25/2016 Subchorionic hemorrhage:  None visualized. Maternal uterus/adnexae: Right ovary is unremarkable. There may be a 2.9 cm corpus luteum cyst in the left ovary. Trace pelvic free fluid. IMPRESSION: Single living intrauterine pregnancy with gestational age of [redacted] weeks 1 day and estimated date of confinement of 07/25/2016. No complicating features. Electronically Signed   By: Leanna Battles M.D.   On: 12/01/2015 18:26   US Ob Transvaginal  12/01/2015  CLINICAL DATA:  Nausea and vomiting. EXAM: OBSTETRIC <14 WK [place] AND TRANSVAGINAL OB US TECHNIQUE: Both transabdominal and transvaginal ultrasound examinations were performed for complete evaluation of the gestation as well as the maternal uterus, adnexal regions, and pelvic cul-de-sac. Transvaginal technique was performed to assess early pregnancy. COMPARISON:  None. FINDINGS: Intrauterine gestational sac: Visualized/normal in shape. Yolk sac:  Visualized. Embryo:  Visualized. Cardiac Activity: Visualized. Heart Rate: 124  bpm CRL:  8  mm   6 w   1 d                  [place] EDC: 07/25/2016 Subchorionic hemorrhage:  None visualized. Maternal uterus/adnexae: Right ovary is unremarkable. There may be a 2.9 cm corpus luteum cyst in the left ovary. Trace pelvic free fluid. IMPRESSION: Single living intrauterine pregnancy with gestational age of [redacted] weeks 1 day and estimated date of confinement of 07/25/2016. No complicating features. Electronically Signed   By: Leanna Battles M.D.   On: 12/01/2015 18:26   I have personally reviewed and  evaluated these images and lab results as part of my medical decision-making.   EKG Interpretation None      MDM   Final diagnoses:  Abdominal pain  Dehydration  Non-intractable vomiting with nausea, vomiting of unspecified type  Pregnant  Patient is [redacted] weeks pregnant and presents for intermittent nausea and vomiting for past 3 days. Reported 8 pound weight loss by OB/GYN in the past week and sent to ED. Patient was treated for positive Chlamydia test yesterday with azithromycin and ceftriaxone. Vitals are stable. Labs are unremarkable. OB ultrasound shows single intrauterine pregnancy with gestational age of [redacted] weeks and 1 day. HR 124. No signs of ectopic pregnancy. I discussed findings with the patient. She is being treated for BV with Macrobid. She is not been able to take her pills the last 2 days secondary to vomiting. UA is negative for UTI. I explained that she should resume taking the pills. She has a  follow-up appointment in 2 days with OB/GYN which I explained she should keep. She is able to tolerate fluids in the ED. She was given Zofran to go home with. Return precautions were discussed and patient agrees with plan.     Catha Gosselin, PA-C 12/01/15 1945  Benjiman Core, MD 12/02/15 [phone]

## 2015-12-01 NOTE — Telephone Encounter (Signed)
Patient seen today in office, [facility] Administered 250mg  of Ceftriaxone today. FYI to PCP.

## 2015-12-01 NOTE — Discharge Instructions (Signed)
Nausea and Vomiting Return for any vaginal bleeding or inability to tolerate fluids. Nausea means you feel sick to your stomach. Throwing up (vomiting) is a reflex where stomach contents come out of your mouth. HOME CARE   Take medicine as told by your doctor.  Do not force yourself to eat. However, you do need to drink fluids.  If you feel like eating, eat a normal diet as told by your doctor.  Eat rice, wheat, potatoes, bread, lean meats, yogurt, fruits, and vegetables.  Avoid high-fat foods.  Drink enough fluids to keep your pee (urine) clear or pale yellow.  Ask your doctor how to replace body fluid losses (rehydrate). Signs of body fluid loss (dehydration) include:  Feeling very thirsty.  Dry lips and mouth.  Feeling dizzy.  Dark pee.  Peeing less than normal.  Feeling confused.  Fast breathing or heart rate. GET HELP RIGHT AWAY IF:   You have blood in your throw up.  You have black or bloody poop (stool).  You have a bad headache or stiff neck.  You feel confused.  You have bad belly (abdominal) pain.  You have chest pain or trouble breathing.  You do not pee at least once every 8 hours.  You have cold, clammy skin.  You keep throwing up after 24 to 48 hours.  You have a fever. MAKE SURE YOU:   Understand these instructions.  Will watch your condition.  Will get help right away if you are not doing well or get worse.   This information is not intended to replace advice given to you by your health care provider. Make sure you discuss any questions you have with your health care provider.   Document Released: 02/07/2008 Document Revised: 11/13/2011 Document Reviewed: 01/20/2011 Elsevier Interactive Patient Education [employer] Elsevier Inc.

## 2015-12-01 NOTE — Patient Instructions (Signed)
Please work with my nurse to see which ER to go to.  You need iv fluids.

## 2015-12-01 NOTE — ED Notes (Signed)
Pt reports that she has been vomiting x 3 days with lower abd pain. Pt reports she went to her PCP and they told her she lost 8lbs in 1 week. Pt is [redacted] weeks pregnant. Pt alert x4.

## 2015-12-01 NOTE — Telephone Encounter (Signed)
-----   Message from Kathee DeltonIan D McKeag, MD sent at 11/23/2015  6:01 PM EDT ----- Merla RichesLadies,  I have tried calling this patient multiple times w/o any luck. [facility] No one has informed her of this (to the best of my knowledge). Also, no one has TREATED this.   Patient has a LAB appointment on 3/21 (likely the day you read this) at 10:00am. I have placed an order for Azithromycin 1g to be picked up at her pharmacy. However, patient needs a dose of Ceftriaxone in the office tomorrow. Please do your best to make sure she gets this!  Thanks!

## 2015-12-03 ENCOUNTER — Other Ambulatory Visit (HOSPITAL_COMMUNITY)
Admission: RE | Admit: 2015-12-03 | Discharge: 2015-12-03 | Disposition: A | Discharge status: Home / Self Care | Payer: BLUE CROSS/BLUE SHIELD | Source: Ambulatory Visit | Attending: Family Medicine | Admitting: Family Medicine

## 2015-12-03 ENCOUNTER — Encounter: Payer: Self-pay | Admitting: Family Medicine

## 2015-12-03 ENCOUNTER — Ambulatory Visit (INDEPENDENT_AMBULATORY_CARE_PROVIDER_SITE_OTHER): Payer: BLUE CROSS/BLUE SHIELD | Admitting: Family Medicine

## 2015-12-03 VITALS — BP 126/70 | HR 101 | Temp 98.3°F | Wt 118.0 lb

## 2015-12-03 DIAGNOSIS — Z113 Encounter for screening for infections with a predominantly sexual mode of transmission: Secondary | ICD-10-CM | POA: Diagnosis present

## 2015-12-03 DIAGNOSIS — Z202 Contact with and (suspected) exposure to infections with a predominantly sexual mode of transmission: Secondary | ICD-10-CM

## 2015-12-03 DIAGNOSIS — Z3400 Encounter for supervision of normal first pregnancy, unspecified trimester: Secondary | ICD-10-CM | POA: Insufficient documentation

## 2015-12-03 DIAGNOSIS — Z3401 Encounter for supervision of normal first pregnancy, first trimester: Secondary | ICD-10-CM

## 2015-12-03 MED ORDER — PRENATAL VITAMINS 0.8 MG PO TABS: 1.0000 | ORAL_TABLET | Freq: Every day | ORAL | Status: DC

## 2015-12-03 NOTE — Progress Notes (Signed)
Sharon Barnett is a 25 y.o. yo G1P0000 at 5410w2d who presents for her initial prenatal visit. Pregnancy  is notplanned She reports breast tenderness, morning sickness, nausea and positive home pregnancy test. She  is notTaking PNV. See flow sheet for details.  PMH, POBH, FH, meds, allergies and Social Hx reviewed.  Prenatal exam: Gen: Well nourished, well developed.  No distress.  Vitals noted. HEENT: Normocephalic, atraumatic.  Neck supple without cervical lymphadenopathy, thyromegaly or thyroid nodules.  fair dentition. CV: RRR no murmur, gallops or rubs Lungs: CTA B.  Normal respiratory effort without wheezes or rales. Abd: soft, NTND. +BS.  Uterus not appreciated above pelvis. Ext: No clubbing, cyanosis or edema. Psych: Normal grooming and dress.  Not depressed or anxious appearing.      Assessment/plan: 1) Pregnancy 6910w2d doing well.  Current pregnancy issues include unplanned pregnancy Dating moderately reliable Prenatal labs reviewed, notable for initial Chlamydia infection, now treated. Bleeding and pain precautions reviewed. Importance of prenatal vitamins reviewed.  Genetic screening offered.  Early glucola is not indicated.    Follow up 4 weeks.   Kathee DeltonIan D McKeag, MD,MS,  PGY2 12/03/2015 4:48 PM

## 2015-12-03 NOTE — Patient Instructions (Signed)
First Trimester of Pregnancy The first trimester of pregnancy is from week 1 until the end of week 12 (months 1 through 3). A week after a sperm fertilizes an egg, the egg will implant on the wall of the uterus. This embryo will begin to develop into a baby. Genes from you and your partner are forming the baby. The female genes determine whether the baby is a boy or a girl. At 6-8 weeks, the eyes and face are formed, and the heartbeat can be seen on ultrasound. At the end of 12 weeks, all the baby's organs are formed.  Now that you are pregnant, you will want to do everything you can to have a healthy baby. Two of the most important things are to get good prenatal care and to follow your health care provider's instructions. Prenatal care is all the medical care you receive before the baby's birth. This care will help prevent, find, and treat any problems during the pregnancy and childbirth. BODY CHANGES Your body goes through many changes during pregnancy. The changes vary from woman to woman.   You may gain or lose a couple of pounds at first.  You may feel sick to your stomach (nauseous) and throw up (vomit). If the vomiting is uncontrollable, call your health care provider.  You may tire easily.  You may develop headaches that can be relieved by medicines approved by your health care provider.  You may urinate more often. Painful urination may mean you have a bladder infection.  You may develop heartburn as a result of your pregnancy.  You may develop constipation because certain hormones are causing the muscles that push waste through your intestines to slow down.  You may develop hemorrhoids or swollen, bulging veins (varicose veins).  Your breasts may begin to grow larger and become tender. Your nipples may stick out more, and the tissue that surrounds them (areola) may become darker.  Your gums may bleed and may be sensitive to brushing and flossing.  Dark spots or blotches (chloasma,  mask of pregnancy) may develop on your face. This will likely fade after the baby is born.  Your menstrual periods will stop.  You may have a loss of appetite.  You may develop cravings for certain kinds of food.  You may have changes in your emotions from day to day, such as being excited to be pregnant or being concerned that something may go wrong with the pregnancy and baby.  You may have more vivid and strange dreams.  You may have changes in your hair. These can include thickening of your hair, rapid growth, and changes in texture. Some women also have hair loss during or after pregnancy, or hair that feels dry or thin. Your hair will most likely return to normal after your baby is born. WHAT TO EXPECT AT YOUR PRENATAL VISITS During a routine prenatal visit:  You will be weighed to make sure you and the baby are growing normally.  Your blood pressure will be taken.  Your abdomen will be measured to track your baby's growth.  The fetal heartbeat will be listened to starting around week 10 or 12 of your pregnancy.  Test results from any previous visits will be discussed. Your health care provider may ask you:  How you are feeling.  If you are feeling the baby move.  If you have had any abnormal symptoms, such as leaking fluid, bleeding, severe headaches, or abdominal cramping.  If you are using any tobacco products,   including cigarettes, chewing tobacco, and electronic cigarettes.  If you have any questions. Other tests that may be performed during your first trimester include:  Blood tests to find your blood type and to check for the presence of any previous infections. They will also be used to check for low iron levels (anemia) and Rh antibodies. Later in the pregnancy, blood tests for diabetes will be done along with other tests if problems develop.  Urine tests to check for infections, diabetes, or protein in the urine.  An ultrasound to confirm the proper growth  and development of the baby.  An amniocentesis to check for possible genetic problems.  Fetal screens for spina bifida and Down syndrome.  You may need other tests to make sure you and the baby are doing well.  HIV (human immunodeficiency virus) testing. Routine prenatal testing includes screening for HIV, unless you choose not to have this test. HOME CARE INSTRUCTIONS  Medicines  Follow your health care provider's instructions regarding medicine use. Specific medicines may be either safe or unsafe to take during pregnancy.  Take your prenatal vitamins as directed.  If you develop constipation, try taking a stool softener if your health care provider approves. Diet  Eat regular, well-balanced meals. Choose a variety of foods, such as meat or vegetable-based protein, fish, milk and low-fat dairy products, vegetables, fruits, and whole grain breads and cereals. Your health care provider will help you determine the amount of weight gain that is right for you.  Avoid raw meat and uncooked cheese. These carry germs that can cause birth defects in the baby.  Eating four or five small meals rather than three large meals a day may help relieve nausea and vomiting. If you start to feel nauseous, eating a few soda crackers can be helpful. Drinking liquids between meals instead of during meals also seems to help nausea and vomiting.  If you develop constipation, eat more high-fiber foods, such as fresh vegetables or fruit and whole grains. Drink enough fluids to keep your urine clear or pale yellow. Activity and Exercise  Exercise only as directed by your health care provider. Exercising will help you:  Control your weight.  Stay in shape.  Be prepared for labor and delivery.  Experiencing pain or cramping in the lower abdomen or low back is a good sign that you should stop exercising. Check with your health care provider before continuing normal exercises.  Try to avoid standing for long  periods of time. Move your legs often if you must stand in one place for a long time.  Avoid heavy lifting.  Wear low-heeled shoes, and practice good posture.  You may continue to have sex unless your health care provider directs you otherwise. Relief of Pain or Discomfort  Wear a good support bra for breast tenderness.   Take warm sitz baths to soothe any pain or discomfort caused by hemorrhoids. Use hemorrhoid cream if your health care provider approves.   Rest with your legs elevated if you have leg cramps or low back pain.  If you develop varicose veins in your legs, wear support hose. Elevate your feet for 15 minutes, 3-4 times a day. Limit salt in your diet. Prenatal Care  Schedule your prenatal visits by the twelfth week of pregnancy. They are usually scheduled monthly at first, then more often in the last 2 months before delivery.  Write down your questions. Take them to your prenatal visits.  Keep all your prenatal visits as directed by your   health care provider. Safety  Wear your seat belt at all times when driving.  Make a list of emergency phone numbers, including numbers for family, friends, the hospital, and police and fire departments. General Tips  Ask your health care provider for a referral to a local prenatal education class. Begin classes no later than at the beginning of month 6 of your pregnancy.  Ask for help if you have counseling or nutritional needs during pregnancy. Your health care provider can offer advice or refer you to specialists for help with various needs.  Do not use hot tubs, steam rooms, or saunas.  Do not douche or use tampons or scented sanitary pads.  Do not cross your legs for long periods of time.  Avoid cat litter boxes and soil used by cats. These carry germs that can cause birth defects in the baby and possibly loss of the fetus by miscarriage or stillbirth.  Avoid all smoking, herbs, alcohol, and medicines not prescribed by  your health care provider. Chemicals in these affect the formation and growth of the baby.  Do not use any tobacco products, including cigarettes, chewing tobacco, and electronic cigarettes. If you need help quitting, ask your health care provider. You may receive counseling support and other resources to help you quit.  Schedule a dentist appointment. At home, brush your teeth with a soft toothbrush and be gentle when you floss. SEEK MEDICAL CARE IF:   You have dizziness.  You have mild pelvic cramps, pelvic pressure, or nagging pain in the abdominal area.  You have persistent nausea, vomiting, or diarrhea.  You have a bad smelling vaginal discharge.  You have pain with urination.  You notice increased swelling in your face, hands, legs, or ankles. SEEK IMMEDIATE MEDICAL CARE IF:   You have a fever.  You are leaking fluid from your vagina.  You have spotting or bleeding from your vagina.  You have severe abdominal cramping or pain.  You have rapid weight gain or loss.  You vomit blood or material that looks like coffee grounds.  You are exposed to German measles and have never had them.  You are exposed to fifth disease or chickenpox.  You develop a severe headache.  You have shortness of breath.  You have any kind of trauma, such as from a fall or a car accident.   This information is not intended to replace advice given to you by your health care provider. Make sure you discuss any questions you have with your health care provider.   Document Released: 08/15/2001 Document Revised: 09/11/2014 Document Reviewed: 07/01/2013 Elsevier Interactive Patient Education 2016 Elsevier Inc.  

## 2015-12-07 ENCOUNTER — Encounter (HOSPITAL_COMMUNITY): Payer: Self-pay | Admitting: *Deleted

## 2015-12-07 ENCOUNTER — Inpatient Hospital Stay (HOSPITAL_COMMUNITY)
Admission: AD | Admit: 2015-12-07 | Discharge: 2015-12-08 | Disposition: A | Discharge status: Home / Self Care | Payer: BLUE CROSS/BLUE SHIELD | Source: Ambulatory Visit | Attending: Obstetrics & Gynecology | Admitting: Obstetrics & Gynecology

## 2015-12-07 ENCOUNTER — Telehealth: Payer: Self-pay | Admitting: Family Medicine

## 2015-12-07 DIAGNOSIS — F909 Attention-deficit hyperactivity disorder, unspecified type: Secondary | ICD-10-CM | POA: Diagnosis not present

## 2015-12-07 DIAGNOSIS — O219 Vomiting of pregnancy, unspecified: Secondary | ICD-10-CM

## 2015-12-07 DIAGNOSIS — Z3A01 Less than 8 weeks gestation of pregnancy: Secondary | ICD-10-CM | POA: Diagnosis not present

## 2015-12-07 DIAGNOSIS — O98811 Other maternal infectious and parasitic diseases complicating pregnancy, first trimester: Secondary | ICD-10-CM | POA: Insufficient documentation

## 2015-12-07 DIAGNOSIS — O210 Mild hyperemesis gravidarum: Secondary | ICD-10-CM | POA: Insufficient documentation

## 2015-12-07 DIAGNOSIS — R112 Nausea with vomiting, unspecified: Secondary | ICD-10-CM | POA: Diagnosis present

## 2015-12-07 DIAGNOSIS — R1032 Left lower quadrant pain: Secondary | ICD-10-CM | POA: Diagnosis not present

## 2015-12-07 LAB — COMPREHENSIVE METABOLIC PANEL
ALT: 15 U/L (ref 14–54)
AST: 16 U/L (ref 15–41)
Albumin: 4 g/dL (ref 3.5–5.0)
Alkaline Phosphatase: 39 U/L (ref 38–126)
Anion gap: 9 (ref 5–15)
BUN: 14 mg/dL (ref 6–20)
CHLORIDE: 105 mmol/L (ref 101–111)
CO2: 22 mmol/L (ref 22–32)
CREATININE: 0.6 mg/dL (ref 0.44–1.00)
Calcium: 9.1 mg/dL (ref 8.9–10.3)
GFR calc non Af Amer: >60 mL/min (ref >60)
Glucose, Bld: 80 mg/dL (ref 65–99)
POTASSIUM: 3.2 mmol/L — AB (ref 3.5–5.1)
SODIUM: 136 mmol/L (ref 135–145)
Total Bilirubin: 1.1 mg/dL (ref 0.3–1.2)
Total Protein: 7.7 g/dL (ref 6.5–8.1)

## 2015-12-07 LAB — CBC WITH DIFFERENTIAL/PLATELET
BASOS ABS: 0 10*3/uL (ref 0.0–0.1)
BASOS PCT: 0 %
EOS ABS: 0 10*3/uL (ref 0.0–0.7)
EOS PCT: 0 %
HCT: 33.2 % — ABNORMAL LOW (ref 36.0–46.0)
Hemoglobin: 11.3 g/dL — ABNORMAL LOW (ref 12.0–15.0)
Lymphocytes Relative: 23 %
Lymphs Abs: 2 10*3/uL (ref 0.7–4.0)
MCH: 29.4 pg (ref 26.0–34.0)
MCHC: 34 g/dL (ref 30.0–36.0)
MCV: 86.5 fL (ref 78.0–100.0)
Monocytes Absolute: 0.3 10*3/uL (ref 0.1–1.0)
Monocytes Relative: 3 %
Neutro Abs: 6.4 10*3/uL (ref 1.7–7.7)
Neutrophils Relative %: 74 %
PLATELETS: 316 10*3/uL (ref 150–400)
RBC: 3.84 MIL/uL — AB (ref 3.87–5.11)
RDW: 13.1 % (ref 11.5–15.5)
WBC: 8.7 10*3/uL (ref 4.0–10.5)

## 2015-12-07 LAB — URINE CYTOLOGY ANCILLARY ONLY
CHLAMYDIA, DNA PROBE: POSITIVE — AB
NEISSERIA GONORRHEA: NEGATIVE

## 2015-12-07 MED ORDER — POTASSIUM CHLORIDE 2 MEQ/ML IV SOLN
Freq: Once | INTRAVENOUS | Status: AC
  Administered 2015-12-07: 23:00:00 via INTRAVENOUS
  Filled 2015-12-07: qty 1000

## 2015-12-07 MED ORDER — LACTATED RINGERS IV BOLUS (SEPSIS)
1000.0000 mL | Freq: Once | INTRAVENOUS | Status: AC
  Administered 2015-12-07: 1000 mL via INTRAVENOUS

## 2015-12-07 MED ORDER — ONDANSETRON HCL 4 MG/2ML IJ SOLN
4.0000 mg | Freq: Once | INTRAMUSCULAR | Status: AC
  Administered 2015-12-07: 4 mg via INTRAVENOUS
  Filled 2015-12-07: qty 2

## 2015-12-07 MED ORDER — PROMETHAZINE HCL 25 MG/ML IJ SOLN
25.0000 mg | Freq: Once | INTRAVENOUS | Status: AC
  Administered 2015-12-07: 25 mg via INTRAVENOUS
  Filled 2015-12-07: qty 1

## 2015-12-07 MED ORDER — AZITHROMYCIN 500 MG PO TABS: 1000.0000 mg | ORAL_TABLET | Freq: Once | ORAL | Status: DC

## 2015-12-07 NOTE — MAU Note (Signed)
Pt presents complaining of nausea and vomiting x2 days. Unable to keep down food or water or her medicine. States she also hasn't had a bowel movement in 2 days. Also having lower abdominal cramping. Denies bleeding

## 2015-12-07 NOTE — MAU Provider Note (Signed)
History     CSN: [id number]  Arrival date and time: 12/07/15 2122   First Provider Initiated Contact with Patient 12/07/15 2127      Chief Complaint  Patient presents with  . Emesis   HPI [name] is a 25 y.o. G1P0000 at [redacted]w[redacted]d who presents to MAU today with complaint of N/V. She has had N/V throughout the pregnancy, but worse x 2 days. She has been taking Zofran with little relief. She has not tried other medications for N/V. She states no BM x 2 days as well. She denies diarrhea or fever. She states LLQ abdominal cramping rated at 8/10 now. She has not taken anything for pain. She denies vaginal bleeding. She had live SIUP on [place] on 12/01/15. She was recently diagnosed with Chlamydia.   OB History    Gravida Para Term Preterm AB TAB SAB Ectopic Multiple Living        Past Medical History  Diagnosis Date  . Boils   . ADHD (attention deficit hyperactivity disorder)   . Complication of anesthesia     Hypersensitive to anesthesia (wisdom teeth)  . Vaginal Pap smear, abnormal     Past Surgical History  Procedure Laterality Date  . Wisdom tooth extraction      Family History  Problem Relation Age of Onset  . Depression Mother   . Diabetes Mother   . Hypertension Mother   . Miscarriages / [place] Mother   . Asthma Sister   . Depression Sister   . Hearing loss Sister   . Birth defects Sister   . Hyperlipidemia Sister   . Hypertension Sister   . Miscarriages / Stillbirths Sister   . Asthma Maternal Grandmother   . Cancer Maternal Grandmother   . Diabetes Maternal Grandmother   . Hypertension Maternal Grandmother   . Stroke Maternal Grandmother   . Vision loss Maternal Grandmother   . Cancer Paternal Grandmother     Social History  Substance Use Topics  . Smoking status: Never Smoker   . Smokeless tobacco: None  . Alcohol Use: No    Allergies: No Known Allergies  Prescriptions prior to admission  Medication Sig Dispense  Refill Last Dose  . ondansetron (ZOFRAN) 4 MG tablet Take 1 tablet (4 mg total) by mouth every 6 (six) hours. 12 tablet 0 Past Week at Unknown time  . azithromycin (ZITHROMAX) 500 MG tablet Take 2 tablets (1,000 mg total) by mouth once. (Patient not taking: Reported on 12/07/2015) 2 tablet 0 Not Taking at Unknown time  . metroNIDAZOLE (FLAGYL) 500 MG tablet Take 1 tablet (500 mg total) by mouth 2 (two) times daily. (Patient not taking: Reported on 12/01/2015) 14 tablet 0 Not Taking at Unknown time  . Prenatal Multivit-Min-Fe-FA (PRENATAL VITAMINS) 0.8 MG tablet Take 1 tablet by mouth daily. (Patient not taking: Reported on 12/07/2015) 30 tablet 11 Not Taking at Unknown time    Review of Systems  Constitutional: Negative for fever and malaise/fatigue.  Gastrointestinal: Positive for nausea, vomiting and abdominal pain. Negative for diarrhea and constipation.  Genitourinary: Negative for dysuria, urgency and frequency.       Neg - vaginal bleeding, discharge   Physical Exam   Blood pressure 106/55, pulse 78, temperature 98.6 F (37 C), temperature source Oral, resp. rate 18, last menstrual period 10/20/2015.  Physical Exam  Nursing note and vitals reviewed. Constitutional: She is oriented to person, place, and time. She appears well-developed  and well-nourished. No distress.  HENT:  Head: Normocephalic and atraumatic.  Cardiovascular: Normal rate.   Respiratory: Effort normal.  GI: Soft. She exhibits no distension and no mass. There is tenderness (mild tenderness to palpation of the LLQ). There is no rebound and no guarding.  Neurological: She is alert and oriented to person, place, and time.  Skin: Skin is warm and dry. No erythema.  Psychiatric: She has a normal mood and affect.    Results for orders placed or performed during the hospital encounter of 12/07/15 (from the past 24 hour(s))  CBC with Differential/Platelet     Status: Abnormal   Collection Time: 12/07/15  9:38 PM  Result  Value Ref Range   WBC 8.7 4.0 - 10.5 K/uL   RBC 3.84 (L) 3.87 - 5.11 MIL/uL   Hemoglobin 11.3 (L) 12.0 - 15.0 g/dL   HCT 09.8 (L) 11.9 - 14.7 %   MCV 86.5 78.0 - 100.0 fL   MCH 29.4 26.0 - 34.0 pg   MCHC 34.0 30.0 - 36.0 g/dL   RDW 82.9 56.2 - 13.0 %   Platelets 316 150 - 400 K/uL   Neutrophils Relative % 74 %   Neutro Abs 6.4 1.7 - 7.7 K/uL   Lymphocytes Relative 23 %   Lymphs Abs 2.0 0.7 - 4.0 K/uL   Monocytes Relative 3 %   Monocytes Absolute 0.3 0.1 - 1.0 K/uL   Eosinophils Relative 0 %   Eosinophils Absolute 0.0 0.0 - 0.7 K/uL   Basophils Relative 0 %   Basophils Absolute 0.0 0.0 - 0.1 K/uL  Comprehensive metabolic panel     Status: Abnormal   Collection Time: 12/07/15  9:38 PM  Result Value Ref Range   Sodium 136 135 - 145 mmol/L   Potassium 3.2 (L) 3.5 - 5.1 mmol/L   Chloride 105 101 - 111 mmol/L   CO2 22 22 - 32 mmol/L   Glucose, Bld 80 65 - 99 mg/dL   BUN 14 6 - 20 mg/dL   Creatinine, Ser 8.65 0.44 - 1.00 mg/dL   Calcium 9.1 8.9 - 78.4 mg/dL   Total Protein 7.7 6.5 - 8.1 g/dL   Albumin 4.0 3.5 - 5.0 g/dL   AST 16 15 - 41 U/L   ALT 15 14 - 54 U/L   Alkaline Phosphatase 39 38 - 126 U/L   Total Bilirubin 1.1 0.3 - 1.2 mg/dL   GFR calc non Af Amer >60 >60 mL/min   GFR calc Af Amer >60 >60 mL/min   Anion gap 9 5 - 15    MAU Course  Procedures None  MDM UA, CBC, CMP today 1 liter IV LR with 25 mg Phenergan given  Reviewed notes from telephone call earlier today. Patient has Chlamydia. Rx was sent for Zithromax from [facility].  SIUP on [place] 12/01/15 1 liter IV NS with 10 mEq K+ and MV given  No emesis while in MAU, but patient still feels nauseous and has not yet produced urine 1 liter LR bolus with 4 mg Zofran given  Patient able to tolerate PO while in MAU. No emesis while in MAU.  Assessment and Plan  A: SIUP at [redacted]w[redacted]d Nausea and vomiting in pregnancy prior to [redacted] weeks gestation   P: Discharge home Rx for Phenergan given to patient  Diet for N/V included in  AVS Patient advised to follow-up with [facility] for routine prenatal care as scheduled or sooner PRN Patient may return to MAU as needed or if her  condition were to change or worsen   Marny LowensteinJulie N Wenzel, PA-C  12/08/2015, 1:07 AM

## 2015-12-07 NOTE — Telephone Encounter (Signed)
Called patient to discuss lab results. No answer and no voicemail set up. Patient positive for chlamydia. Will send in prescription for azithromycin to pharmacy. She should have a test of cure at her next appointment.  Katina Degreealeb M. Jimmey RalphParker, MD [facility] Resident PGY-2 12/07/2015 2:09 PM

## 2015-12-08 DIAGNOSIS — O219 Vomiting of pregnancy, unspecified: Secondary | ICD-10-CM

## 2015-12-08 MED ORDER — PROMETHAZINE HCL 12.5 MG PO TABS: 12.5000 mg | ORAL_TABLET | Freq: Four times a day (QID) | ORAL | Status: DC | PRN

## 2015-12-08 NOTE — Telephone Encounter (Signed)
Pt made aware of results of this test and prescriptions while at MAU.  Jazmin Hartsell,CMA

## 2015-12-08 NOTE — MAU Note (Signed)
Took patient out to car and she said she did not feel right. She felt funny. Took pt back inside. V/S taken and normal. J.Wenzle, PA assessed pt and offered for pt to stay longer and rest if she wanted. Pt decided to go home instead. Assisted pt back outside and into her grandmothers car.

## 2015-12-08 NOTE — Discharge Instructions (Signed)
Morning Sickness °Morning sickness is when you feel sick to your stomach (nauseous) during pregnancy. You may feel sick to your stomach and throw up (vomit). You may feel sick in the morning, but you can feel this way any time of day. Some women feel very sick to their stomach and cannot stop throwing up (hyperemesis gravidarum). °HOME CARE °· Only take medicines as told by your doctor. °· Take multivitamins as told by your doctor. Taking multivitamins before getting pregnant can stop or lessen the harshness of morning sickness. °· Eat dry toast or unsalted crackers before getting out of bed. °· Eat 5 to 6 small meals a day. °· Eat dry and bland foods like rice and baked potatoes. °· Do not drink liquids with meals. Drink between meals. °· Do not eat greasy, fatty, or spicy foods. °· Have someone cook for you if the smell of food causes you to feel sick or throw up. °· If you feel sick to your stomach after taking prenatal vitamins, take them at night or with a snack. °· Eat protein when you need a snack (nuts, yogurt, cheese). °· Eat unsweetened gelatins for dessert. °· Wear a bracelet used for sea sickness (acupressure wristband). °· Go to a doctor that puts thin needles into certain body points (acupuncture) to improve how you feel. °· Do not smoke. °· Use a humidifier to keep the air in your house free of odors. °· Get lots of fresh air. °GET HELP IF: °· You need medicine to feel better. °· You feel dizzy or lightheaded. °· You are losing weight. °GET HELP RIGHT AWAY IF:  °· You feel very sick to your stomach and cannot stop throwing up. °· You pass out (faint). °MAKE SURE YOU: °· Understand these instructions. °· Will watch your condition. °· Will get help right away if you are not doing well or get worse. °  °This information is not intended to replace advice given to you by your health care provider. Make sure you discuss any questions you have with your health care provider. °  °Document Released: 09/28/2004  Document Revised: 09/11/2014 Document Reviewed: 02/05/2013 °Elsevier Interactive Patient Education ©2016 Elsevier Inc. °Eating Plan for Hyperemesis Gravidarum °Severe cases of hyperemesis gravidarum can lead to dehydration and malnutrition. The hyperemesis eating plan is one way to lessen the symptoms of nausea and vomiting. It is often used with prescribed medicines to control your symptoms.  °WHAT CAN I DO TO RELIEVE MY SYMPTOMS? °Listen to your body. Everyone is different and has different preferences. Find what works best for you. Some of the following things may help: °· Eat and drink slowly. °· Eat 5-6 small meals daily instead of 3 large meals.   °· Eat crackers before you get out of bed in the morning.   °· Starchy foods are usually well tolerated (such as cereal, toast, bread, potatoes, pasta, rice, and pretzels).   °· Ginger may help with nausea. Add ¼ tsp ground ginger to hot tea or choose ginger tea.   °· Try drinking 100% fruit juice or an electrolyte drink. °· Continue to take your prenatal vitamins as directed by your health care provider. If you are having trouble taking your prenatal vitamins, talk with your health care provider about different options. °· Include at least 1 serving of protein with your meals and snacks (such as meats or poultry, beans, nuts, eggs, or yogurt). Try eating a protein-rich snack before bed (such as cheese and crackers or a half turkey or peanut butter sandwich). °  WHAT THINGS SHOULD I AVOID TO REDUCE MY SYMPTOMS? °The following things may help reduce your symptoms: °· Avoid foods with strong smells. Try eating meals in well-ventilated areas that are free of odors. °· Avoid drinking water or other beverages with meals. Try not to drink anything less than 30 minutes before and after meals. °· Avoid drinking more than 1 cup of fluid at a time. °· Avoid fried or high-fat foods, such as butter and cream sauces. °· Avoid spicy foods. °· Avoid skipping meals the best you can.  Nausea can be more intense on an empty stomach. If you cannot tolerate food at that time, do not force it. Try sucking on ice chips or other frozen items and make up the calories later. °· Avoid lying down within 2 hours after eating. °  °This information is not intended to replace advice given to you by your health care provider. Make sure you discuss any questions you have with your health care provider. °  °Document Released: 06/18/2007 Document Revised: 08/26/2013 Document Reviewed: 06/25/2013 °Elsevier Interactive Patient Education ©2016 Elsevier Inc. ° °

## 2015-12-14 ENCOUNTER — Encounter (HOSPITAL_COMMUNITY): Payer: Self-pay | Admitting: *Deleted

## 2015-12-14 ENCOUNTER — Telehealth: Payer: Self-pay | Admitting: Family Medicine

## 2015-12-14 ENCOUNTER — Inpatient Hospital Stay (HOSPITAL_COMMUNITY)
Admission: EM | Admit: 2015-12-14 | Discharge: 2015-12-14 | Disposition: A | Discharge status: Home / Self Care | Payer: BLUE CROSS/BLUE SHIELD | Attending: Family Medicine | Admitting: Family Medicine

## 2015-12-14 DIAGNOSIS — E860 Dehydration: Secondary | ICD-10-CM

## 2015-12-14 DIAGNOSIS — Z3A01 Less than 8 weeks gestation of pregnancy: Secondary | ICD-10-CM | POA: Insufficient documentation

## 2015-12-14 DIAGNOSIS — O210 Mild hyperemesis gravidarum: Secondary | ICD-10-CM | POA: Diagnosis present

## 2015-12-14 LAB — COMPREHENSIVE METABOLIC PANEL
ALBUMIN: 4 g/dL (ref 3.5–5.0)
ALK PHOS: 37 U/L — AB (ref 38–126)
ALT: 18 U/L (ref 14–54)
ANION GAP: 11 (ref 5–15)
AST: 18 U/L (ref 15–41)
BUN: 15 mg/dL (ref 6–20)
CHLORIDE: 106 mmol/L (ref 101–111)
CO2: 21 mmol/L — AB (ref 22–32)
Calcium: 9.5 mg/dL (ref 8.9–10.3)
Creatinine, Ser: 0.62 mg/dL (ref 0.44–1.00)
GFR calc Af Amer: >60 mL/min (ref >60)
GFR calc non Af Amer: >60 mL/min (ref >60)
GLUCOSE: 76 mg/dL (ref 65–99)
POTASSIUM: 3.6 mmol/L (ref 3.5–5.1)
SODIUM: 138 mmol/L (ref 135–145)
Total Bilirubin: 0.8 mg/dL (ref 0.3–1.2)
Total Protein: 7.6 g/dL (ref 6.5–8.1)

## 2015-12-14 LAB — CBC WITH DIFFERENTIAL/PLATELET
BASOS PCT: 0 %
Basophils Absolute: 0 10*3/uL (ref 0.0–0.1)
EOS ABS: 0 10*3/uL (ref 0.0–0.7)
Eosinophils Relative: 0 %
HCT: 32.9 % — ABNORMAL LOW (ref 36.0–46.0)
HEMOGLOBIN: 11.4 g/dL — AB (ref 12.0–15.0)
Lymphocytes Relative: 19 %
Lymphs Abs: 1.6 10*3/uL (ref 0.7–4.0)
MCH: 30.4 pg (ref 26.0–34.0)
MCHC: 34.7 g/dL (ref 30.0–36.0)
MCV: 87.7 fL (ref 78.0–100.0)
MONOS PCT: 4 %
Monocytes Absolute: 0.3 10*3/uL (ref 0.1–1.0)
NEUTROS PCT: 77 %
Neutro Abs: 6.6 10*3/uL (ref 1.7–7.7)
Platelets: 315 10*3/uL (ref 150–400)
RBC: 3.75 MIL/uL — ABNORMAL LOW (ref 3.87–5.11)
RDW: 13 % (ref 11.5–15.5)
WBC: 8.6 10*3/uL (ref 4.0–10.5)

## 2015-12-14 LAB — URINE MICROSCOPIC-ADD ON: RBC / HPF: NONE SEEN RBC/hpf (ref 0–5)

## 2015-12-14 LAB — TSH: TSH: 0.639 u[IU]/mL (ref 0.350–4.500)

## 2015-12-14 LAB — URINALYSIS, ROUTINE W REFLEX MICROSCOPIC
Bilirubin Urine: NEGATIVE
Glucose, UA: NEGATIVE mg/dL
Hgb urine dipstick: NEGATIVE
Ketones, ur: >80 mg/dL — AB
LEUKOCYTES UA: NEGATIVE
NITRITE: NEGATIVE
PH: 6 (ref 5.0–8.0)
Protein, ur: 30 mg/dL — AB
SPECIFIC GRAVITY, URINE: 1.04 — AB (ref 1.005–1.030)

## 2015-12-14 LAB — T4, FREE: Free T4: 1.05 ng/dL (ref 0.61–1.12)

## 2015-12-14 LAB — LIPASE, BLOOD: Lipase: 20 U/L (ref 11–51)

## 2015-12-14 MED ORDER — METOCLOPRAMIDE HCL 5 MG/ML IJ SOLN
10.0000 mg | Freq: Once | INTRAMUSCULAR | Status: AC
  Administered 2015-12-14: 10 mg via INTRAVENOUS
  Filled 2015-12-14: qty 2

## 2015-12-14 MED ORDER — SODIUM CHLORIDE 0.9 % IV BOLUS (SEPSIS)
1000.0000 mL | Freq: Once | INTRAVENOUS | Status: AC
  Administered 2015-12-14: 1000 mL via INTRAVENOUS

## 2015-12-14 MED ORDER — PROMETHAZINE HCL 25 MG PO TABS: 25.0000 mg | ORAL_TABLET | Freq: Four times a day (QID) | ORAL | Status: DC | PRN

## 2015-12-14 MED ORDER — ONDANSETRON HCL 4 MG/2ML IJ SOLN
4.0000 mg | Freq: Once | INTRAMUSCULAR | Status: AC
  Administered 2015-12-14: 4 mg via INTRAVENOUS
  Filled 2015-12-14: qty 2

## 2015-12-14 MED ORDER — PYRIDOXINE HCL 100 MG/ML IJ SOLN
100.0000 mg | Freq: Once | INTRAMUSCULAR | Status: AC
  Administered 2015-12-14: 100 mg via INTRAVENOUS
  Filled 2015-12-14: qty 1

## 2015-12-14 MED ORDER — SODIUM CHLORIDE 0.9 % IV BOLUS (SEPSIS): 1000.0000 mL | Freq: Once | INTRAVENOUS | Status: DC

## 2015-12-14 MED ORDER — DEXTROSE 5 % IN LACTATED RINGERS IV BOLUS
1000.0000 mL | Freq: Once | INTRAVENOUS | Status: AC
  Administered 2015-12-14: 1000 mL via INTRAVENOUS

## 2015-12-14 NOTE — MAU Provider Note (Signed)
Sharon Barnett is a 25 y.o. G1P0 at 4123w6d [facility] due to intractable nausea and vomiting. She received IV normal saline 1000mg  with multivit, Zofran 4 mg and Reglan 10 mg without much alleviation of her symptoms.  8# wt loss noted at 6 wk office visit. US at 5633w1d: viable IUP  Pregnancy course: 11/16/15 had + CT, treated Azithro and Rocephin; 3/23 Flagyl for BV;  evaluated at MAU for abd pain in early pregnancy  HPI EMESIS Severity:  Severe Duration:  3 weeks Timing:  Constant Number of daily episodes:  Greater than 10 times per day Quality:  Stomach contents Progression:  Unchanged Chronicity:  New Recent urination:  Decreased Relieved by:  Nothing Worsened by:  Nothing tried Ineffective treatments: promethazine, zofran. Associated symptomsabdominal pain, no cough, no diarrhea, no fever, no headaches, no sore throat and no URI    Past Medical History  Diagnosis Date  . Boils   . ADHD (attention deficit hyperactivity disorder)   . Complication of anesthesia     Hypersensitive to anesthesia (wisdom teeth)  . Vaginal Pap smear, abnormal    Past Surgical History  Procedure Laterality Date  . Wisdom tooth extraction     Review of Systems  Constitutional: Positive for weight loss and malaise/fatigue. Negative for fever and chills.  Cardiovascular: Negative for chest pain.  Gastrointestinal: Positive for nausea, vomiting and abdominal pain. Negative for diarrhea and constipation.  Genitourinary: Negative for dysuria, urgency, frequency, hematuria and flank pain.  Musculoskeletal: Negative for myalgias.  Neurological: Positive for weakness. Negative for dizziness.   Filed Vitals:   12/14/15 1430 12/14/15 1724 12/14/15 1725 12/14/15 1805  BP: 122/75 115/68 115/68 121/64  Pulse: 79 68 68 63  Temp:  98.3 F (36.8 C) 98.3 F (36.8 C) 98.3 F (36.8 C)  TempSrc:  Oral  Oral  Resp: 18 16 16 16   Height:    5\' 8"  (1.727 m)  Weight:    53.524 kg (118 lb)  SpO2:  99% 99% 99%    Physical Exam  Constitutional: She is oriented to person, place, and time.  Thin, vomiting mucusy emesis  HENT:  Head: Normocephalic.  Eyes: Pupils are equal, round, and reactive to light.  Neck: Normal range of motion.  Cardiovascular: Normal rate.   Pulmonary/Chest: Effort normal.  Abdominal: Soft. There is no tenderness.  Neurological: She is alert and oriented to person, place, and time.  Skin: Skin is warm and dry.  Nursing note and vitals reviewed.   Results for orders placed or performed during the hospital encounter of 12/14/15 (from the past 24 hour(s))  CBC with Differential     Status: Abnormal   Collection Time: 12/14/15 11:41 AM  Result Value Ref Range   WBC 8.6 4.0 - 10.5 K/uL   RBC 3.75 (L) 3.87 - 5.11 MIL/uL   Hemoglobin 11.4 (L) 12.0 - 15.0 g/dL   HCT 16.132.9 (L) 09.636.0 - 04.546.0 %   MCV 87.7 78.0 - 100.0 fL   MCH 30.4 26.0 - 34.0 pg   MCHC 34.7 30.0 - 36.0 g/dL   RDW 40.913.0 81.111.5 - 91.415.5 %   Platelets 315 150 - 400 K/uL   Neutrophils Relative % 77 %   Neutro Abs 6.6 1.7 - 7.7 K/uL   Lymphocytes Relative 19 %   Lymphs Abs 1.6 0.7 - 4.0 K/uL   Monocytes Relative 4 %   Monocytes Absolute 0.3 0.1 - 1.0 K/uL   Eosinophils Relative 0 %   Eosinophils Absolute  0.0 0.0 - 0.7 K/uL   Basophils Relative 0 %   Basophils Absolute 0.0 0.0 - 0.1 K/uL  Comprehensive metabolic panel     Status: Abnormal   Collection Time: 12/14/15 11:41 AM  Result Value Ref Range   Sodium 138 135 - 145 mmol/L   Potassium 3.6 3.5 - 5.1 mmol/L   Chloride 106 101 - 111 mmol/L   CO2 21 (L) 22 - 32 mmol/L   Glucose, Bld 76 65 - 99 mg/dL   BUN 15 6 - 20 mg/dL   Creatinine, Ser 1.61 0.44 - 1.00 mg/dL   Calcium 9.5 8.9 - 09.6 mg/dL   Total Protein 7.6 6.5 - 8.1 g/dL   Albumin 4.0 3.5 - 5.0 g/dL   AST 18 15 - 41 U/L   ALT 18 14 - 54 U/L   Alkaline Phosphatase 37 (L) 38 - 126 U/L   Total Bilirubin 0.8 0.3 - 1.2 mg/dL   GFR calc non Af Amer >60 >60 mL/min   GFR calc Af Amer >60 >60  mL/min   Anion gap 11 5 - 15  Lipase, blood     Status: None   Collection Time: 12/14/15 11:41 AM  Result Value Ref Range   Lipase 20 11 - 51 U/L  Urinalysis, Routine w reflex microscopic (not at [facility])     Status: Abnormal   Collection Time: 12/14/15  2:17 PM  Result Value Ref Range   Color, Urine AMBER (A) YELLOW   APPearance CLEAR CLEAR   Specific Gravity, Urine 1.040 (H) 1.005 - 1.030   pH 6.0 5.0 - 8.0   Glucose, UA NEGATIVE NEGATIVE mg/dL   Hgb urine dipstick NEGATIVE NEGATIVE   Bilirubin Urine NEGATIVE NEGATIVE   Ketones, ur >80 (A) NEGATIVE mg/dL   Protein, ur 30 (A) NEGATIVE mg/dL   Nitrite NEGATIVE NEGATIVE   Leukocytes, UA NEGATIVE NEGATIVE  Urine microscopic-add on     Status: Abnormal   Collection Time: 12/14/15  2:17 PM  Result Value Ref Range   Squamous Epithelial / LPF 6-30 (A) NONE SEEN   WBC, UA 0-5 0 - 5 WBC/hpf   RBC / HPF NONE SEEN 0 - 5 RBC/hpf   Bacteria, UA FEW (A) NONE SEEN   Urine-Other MUCOUS PRESENT   TSH     Status: None   Collection Time: 12/14/15  4:55 PM  Result Value Ref Range   TSH 0.639 0.350 - 4.500 uIU/mL  Care assumed by Judeth Horn FNP at 2110  ASSESSMENT: G1 at [redacted]w[redacted]d Hyperemesis arising during pregnancy  PLAN:   Medication List    STOP taking these medications        azithromycin 500 MG tablet  Commonly known as:  ZITHROMAX      TAKE these medications        ondansetron 4 MG tablet  Commonly known as:  ZOFRAN  Take 1 tablet (4 mg total) by mouth every 6 (six) hours.     Prenatal Vitamins 0.8 MG tablet  Take 1 tablet by mouth daily.     promethazine 12.5 MG tablet  Commonly known as:  PHENERGAN  Take 1 tablet (12.5 mg total) by mouth every 6 (six) hours as needed for nausea or vomiting.

## 2015-12-14 NOTE — ED Notes (Signed)
Pt reminded of need for urine pt states unable to go

## 2015-12-14 NOTE — MAU Note (Signed)
[facility] for hyperemesis at [redacted] weeks gestation.

## 2015-12-14 NOTE — ED Notes (Signed)
Pt given ginger ale.

## 2015-12-14 NOTE — ED Notes (Signed)
Bed: [id number] Expected date:  Expected time:  Means of arrival:  Comments: EMS- 24yo F, flu-like symptoms/pregnant x 7 weeks

## 2015-12-14 NOTE — ED Notes (Signed)
Pt is sleeping soundly with equal chest rise and fall noted.  Family at bedside requests to speak with the doctor; MD notified.

## 2015-12-14 NOTE — Discharge Instructions (Signed)
Morning Sickness °Morning sickness is when you feel sick to your stomach (nauseous) during pregnancy. This nauseous feeling may or may not come with vomiting. It often occurs in the morning but can be a problem any time of day. Morning sickness is most common during the first trimester, but it may continue throughout pregnancy. While morning sickness is unpleasant, it is usually harmless unless you develop severe and continual vomiting (hyperemesis gravidarum). This condition requires more intense treatment.  °CAUSES  °The cause of morning sickness is not completely known but seems to be related to normal hormonal changes that occur in pregnancy. °RISK FACTORS °You are at greater risk if you: °· Experienced nausea or vomiting before your pregnancy. °· Had morning sickness during a previous pregnancy. °· Are pregnant with more than one baby, such as twins. °TREATMENT  °Do not use any medicines (prescription, over-the-counter, or herbal) for morning sickness without first talking to your health care provider. Your health care provider may prescribe or recommend: °· Vitamin B6 supplements. °· Anti-nausea medicines. °· The herbal medicine ginger. °HOME CARE INSTRUCTIONS  °· Only take over-the-counter or prescription medicines as directed by your health care provider. °· Taking multivitamins before getting pregnant can prevent or decrease the severity of morning sickness in most women. °· Eat a piece of dry toast or unsalted crackers before getting out of bed in the morning. °· Eat five or six small meals a day. °· Eat dry and bland foods (rice, baked potato). Foods high in carbohydrates are often helpful. °· Do not drink liquids with your meals. Drink liquids between meals. °· Avoid greasy, fatty, and spicy foods. °· Get someone to cook for you if the smell of any food causes nausea and vomiting. °· If you feel nauseous after taking prenatal vitamins, take the vitamins at night or with a snack.  °· Snack on protein  foods (nuts, yogurt, cheese) between meals if you are hungry. °· Eat unsweetened gelatins for desserts. °· Wearing an acupressure wristband (worn for sea sickness) may be helpful. °· Acupuncture may be helpful. °· Do not smoke. °· Get a humidifier to keep the air in your house free of odors. °· Get plenty of fresh air. °SEEK MEDICAL CARE IF:  °· Your home remedies are not working, and you need medicine. °· You feel dizzy or lightheaded. °· You are losing weight. °SEEK IMMEDIATE MEDICAL CARE IF:  °· You have persistent and uncontrolled nausea and vomiting. °· You pass out (faint). °MAKE SURE YOU: °· Understand these instructions. °· Will watch your condition. °· Will get help right away if you are not doing well or get worse. °  °This information is not intended to replace advice given to you by your health care provider. Make sure you discuss any questions you have with your health care provider. °  °Document Released: 10/12/2006 Document Revised: 08/26/2013 Document Reviewed: 02/05/2013 °Elsevier Interactive Patient Education ©2016 [employer] ° ° ° °Eating Plan for Hyperemesis Gravidarum °Severe cases of hyperemesis gravidarum can lead to dehydration and malnutrition. The hyperemesis eating plan is one way to lessen the symptoms of nausea and vomiting. It is often used with prescribed medicines to control your symptoms.  °WHAT CAN I DO TO RELIEVE MY SYMPTOMS? °Listen to your body. Everyone is different and has different preferences. Find what works best for you. Some of the following things may help: °· Eat and drink slowly. °· Eat 5-6 small meals daily instead of 3 large meals.   °· Eat crackers before you   get out of bed in the morning.   °· Starchy foods are usually well tolerated (such as cereal, toast, bread, potatoes, pasta, rice, and pretzels).   °· Ginger may help with nausea. Add ¼ tsp ground ginger to hot tea or choose ginger tea.   °· Try drinking 100% fruit juice or an electrolyte drink. °· Continue  to take your prenatal vitamins as directed by your health care provider. If you are having trouble taking your prenatal vitamins, talk with your health care provider about different options. °· Include at least 1 serving of protein with your meals and snacks (such as meats or poultry, beans, nuts, eggs, or yogurt). Try eating a protein-rich snack before bed (such as cheese and crackers or a half turkey or peanut butter sandwich). °WHAT THINGS SHOULD I AVOID TO REDUCE MY SYMPTOMS? °The following things may help reduce your symptoms: °· Avoid foods with strong smells. Try eating meals in well-ventilated areas that are free of odors. °· Avoid drinking water or other beverages with meals. Try not to drink anything less than 30 minutes before and after meals. °· Avoid drinking more than 1 cup of fluid at a time. °· Avoid fried or high-fat foods, such as butter and cream sauces. °· Avoid spicy foods. °· Avoid skipping meals the best you can. Nausea can be more intense on an empty stomach. If you cannot tolerate food at that time, do not force it. Try sucking on ice chips or other frozen items and make up the calories later. °· Avoid lying down within 2 hours after eating. °  °This information is not intended to replace advice given to you by your health care provider. Make sure you discuss any questions you have with your health care provider. °  °Document Released: 06/18/2007 Document Revised: 08/26/2013 Document Reviewed: 06/25/2013 °Elsevier Interactive Patient Education ©2016 Elsevier Inc. ° °

## 2015-12-14 NOTE — Telephone Encounter (Signed)
Mother is calling for her daughter who is having a very difficult pregnancy. She has been back and forth to the MAU. She will need a month or two of from work and would like to get a letter from the doctor. She would like to speak to the doctor about this. Please call jw

## 2015-12-14 NOTE — ED Notes (Signed)
EMS called to home.  Patient found to have nausea and vomiting.  Patient's states that she has had these Symptoms for over a month.  Patient is approx. [redacted] weeks pregnant.  EMS placed 20g L ac.  4mg  zofran  BP 155/70 HR 80 RR 16 O2  98% ORA.  Currently she rates her pain 6 of 10.

## 2015-12-14 NOTE — ED Provider Notes (Signed)
CSN: [id number]     Arrival date & time 12/14/15  1051 History   First MD Initiated Contact with Patient 12/14/15 1100     Chief Complaint  Patient presents with  . Emesis     (Consider location/radiation/quality/duration/timing/severity/associated sxs/prior Treatment) Patient is a 25 y.o. female presenting with vomiting.  Emesis Severity:  Severe Duration:  3 weeks Timing:  Constant Number of daily episodes:  Greater than 10 times per day Quality:  Stomach contents Progression:  Unchanged Chronicity:  New Recent urination:  Decreased Relieved by:  Nothing Worsened by:  Nothing tried Ineffective treatments: promethazine, zofran. Associated symptoms: no abdominal pain, no cough, no diarrhea, no fever, no headaches, no sore throat and no URI   Risk factors: pregnant now (8wk)     Past Medical History  Diagnosis Date  . Boils   . ADHD (attention deficit hyperactivity disorder)   . Complication of anesthesia     Hypersensitive to anesthesia (wisdom teeth)  . Vaginal Pap smear, abnormal    Past Surgical History  Procedure Laterality Date  . Wisdom tooth extraction     Family History  Problem Relation Age of Onset  . Depression Mother   . Diabetes Mother   . Hypertension Mother   . Miscarriages / [place] Mother   . Asthma Sister   . Depression Sister   . Hearing loss Sister   . Birth defects Sister   . Hyperlipidemia Sister   . Hypertension Sister   . Miscarriages / Stillbirths Sister   . Asthma Maternal Grandmother   . Cancer Maternal Grandmother   . Diabetes Maternal Grandmother   . Hypertension Maternal Grandmother   . Stroke Maternal Grandmother   . Vision loss Maternal Grandmother   . Cancer Paternal Grandmother    Social History  Substance Use Topics  . Smoking status: Never Smoker   . Smokeless tobacco: None  . Alcohol Use: No   OB History    Gravida Para Term Preterm AB TAB SAB Ectopic Multiple Living   1 0 0 0 0 0 0 0 0 0      Review of  Systems  Constitutional: Negative for fever.  HENT: Negative for sore throat.   Eyes: Negative for visual disturbance.  Respiratory: Negative for cough and shortness of breath.   Cardiovascular: Negative for chest pain.  Gastrointestinal: Positive for nausea, vomiting and constipation. Negative for abdominal pain and diarrhea.  Genitourinary: Positive for decreased urine volume. Negative for dysuria, vaginal bleeding, vaginal discharge and difficulty urinating.  Musculoskeletal: Negative for back pain and neck pain.  Skin: Negative for rash.  Neurological: Negative for syncope and headaches.      Allergies  Review of patient's allergies indicates no known allergies.  Home Medications   Prior to Admission medications   Medication Sig Start Date End Date Taking? Authorizing Provider  promethazine (PHENERGAN) 12.5 MG tablet Take 1 tablet (12.5 mg total) by mouth every 6 (six) hours as needed for nausea or vomiting. 12/08/15  Yes Marny LowensteinJulie N Wenzel, PA-C  azithromycin (ZITHROMAX) 500 MG tablet Take 2 tablets (1,000 mg total) by mouth once. Patient not taking: Reported on 12/07/2015 12/07/15   Ardith Darkaleb M Parker, MD  ondansetron (ZOFRAN) 4 MG tablet Take 1 tablet (4 mg total) by mouth every 6 (six) hours. Patient not taking: Reported on 12/14/2015 12/01/15   Catha GosselinHanna Patel-Mills, PA-C  Prenatal Multivit-Min-Fe-FA (PRENATAL VITAMINS) 0.8 MG tablet Take 1 tablet by mouth daily. Patient not taking: Reported on 12/07/2015 12/03/15   Janine OresIan D  McKeag, MD   BP 115/68 mmHg  Pulse 68  Temp(Src) 98.3 F (36.8 C) (Oral)  Resp 16  SpO2 99%  LMP 10/20/2015 (Approximate) Physical Exam  Constitutional: She is oriented to person, place, and time. She appears well-developed and well-nourished. She appears ill (appears fatigued and nauseas). No distress.  HENT:  Head: Normocephalic and atraumatic.  Eyes: Conjunctivae and EOM are normal.  Neck: Normal range of motion.  Cardiovascular: Normal rate, regular rhythm, normal  heart sounds and intact distal pulses.  Exam reveals no gallop and no friction rub.   No murmur heard. Pulmonary/Chest: Effort normal and breath sounds normal. No respiratory distress. She has no wheezes. She has no rales.  Abdominal: Soft. She exhibits no distension. There is no tenderness. There is no guarding.  Musculoskeletal: She exhibits no edema or tenderness.  Neurological: She is alert and oriented to person, place, and time.  Skin: Skin is warm and dry. No rash noted. She is not diaphoretic. No erythema.  Nursing note and vitals reviewed.   ED Course  Procedures (including critical care time) Labs Review Labs Reviewed  CBC WITH DIFFERENTIAL/PLATELET - Abnormal; Notable for the following:    RBC 3.75 (*)    Hemoglobin 11.4 (*)    HCT 32.9 (*)    All other components within normal limits  COMPREHENSIVE METABOLIC PANEL - Abnormal; Notable for the following:    CO2 21 (*)    Alkaline Phosphatase 37 (*)    All other components within normal limits  URINALYSIS, ROUTINE W REFLEX MICROSCOPIC (NOT AT [facility]) - Abnormal; Notable for the following:    Color, Urine AMBER (*)    Specific Gravity, Urine 1.040 (*)    Ketones, ur >80 (*)    Protein, ur 30 (*)    All other components within normal limits  URINE MICROSCOPIC-ADD ON - Abnormal; Notable for the following:    Squamous Epithelial / LPF 6-30 (*)    Bacteria, UA FEW (*)    All other components within normal limits  LIPASE, BLOOD  TSH  T4, FREE    Imaging Review No results found. I have personally reviewed and evaluated these images and lab results as part of my medical decision-making.   EKG Interpretation None      MDM   Final diagnoses:  Hyperemesis arising during pregnancy   25 year old female G1 at 8 weeks by LMP and early [place] who presents with concern for nausea and vomiting. Patient has presented to the emergency department and MAU at [facility] for the same, and has been taking promethazine and zofran at home  without relief.  Her abdominal exam is benign, she denies any significant abdominal pain, has no vaginal bleeding, and have low suspicion for appendicitis, cholecystitis or diverticulitis.  Patient has normal vital signs however appears fatigued on exam. Labs show no significant abnormalities, bicarb 21.  UA shows ketones, high specific gravity. She is given a liter of normal saline, and vitamin B6.  Patient with continuing nausea following fluids and B6, ordered reglan.  Continued emesis after reglan. Discussed with Dr. Adrian Blackwater of OBGYN. Will give D5-Lactated ringers bolus and add zofran IV. Discussed options with family including continued IV hydration and nausea control in ED or transfer to MAU (admitted there 4/4) for continued hydration and nausea and pt and family prefer transfer with possible admission.   Alvira Monday, MD 12/14/15 1729

## 2015-12-15 ENCOUNTER — Telehealth: Payer: Self-pay | Admitting: Family Medicine

## 2015-12-15 MED ORDER — DOXYLAMINE-PYRIDOXINE 10-10 MG PO TBEC: DELAYED_RELEASE_TABLET | ORAL | Status: DC

## 2015-12-15 NOTE — Telephone Encounter (Signed)
Mom called to speak to nurse about her daughter. CMA/Desiree transferred her to me since she had already spoken with her about the same issue multiple times. Per mom, Sharon Barnett who is [redacted] wks pregnant continued to vomit despite taking Phenergan and Zofran. Mom and patient seems to be upset about this situation. She had been to the hospital multiple times. I discussed normal progression of Hyperemesis gravidarum with patient's mom, I recommended drinking sips of fluid/liquid at an increase frequency and if there is no improvement to take her to the ED for IV medication. Mom was not satisfied with my recommendation, she was impolite through this period of conversation. I eventually advised her to bring patient in to see us. [facility] will help with scheduling.  NB: Nurse informed me she was unable to get her in to see a provider today since there is no opening. My got upset and hung up on her. I contacted mom back and advised trial of Diclegis. I will print and place med for faxing. Mom advised that Diclegis should not be taken with Phenergan/Zofran. If no improvement again I recommended to her to bring her daughter to the clinic and if there is no opening to take her to the hospital. She agreed with plan and verbalized understanding of instructions given.  [id number] - [place], [place] - [address] [phone] (Phone) [phone] (Fax)

## 2015-12-15 NOTE — MAU Provider Note (Signed)
Care assumed from Deirdre Poe CNM at 2100.   Pt no longer vomiting in MAU after IV fluids & antiemetics Discussed meds at home. Will rx phenergan 25mg  Advance diet as tolerated Weight stable & labs normal  A: 1. Hyperemesis arising during pregnancy   2. Dehydration     P: Discharge home Rx phenergan 25 mg Hyperemesis diet plan info given Keep f/u with OB

## 2015-12-16 NOTE — Telephone Encounter (Signed)
Thank you Deseree and Dr. Lum BabeEniola for helping this patient out!

## 2015-12-16 NOTE — Telephone Encounter (Signed)
Spoke with pt mom yesterday about her hyperemesis. She said none of the meds they gave her at the ED were working and that her daughter said she wanted to have an abortion, because she felt like she was killing herself. i got dr Lum Babeeniola to talk with mom about other options. Was told to try to set her up an appt to be seen that afternoon. Informed mom that they were no openings and she says "yall are playing with me, if my daughter dies me and my lawyer will be coming to see you guys", and hung up on me. I informed dr Lum Babeeniola about what pt had said and she took over. Sharon Barnett, CMA

## 2015-12-17 ENCOUNTER — Telehealth: Payer: Self-pay | Admitting: Family Medicine

## 2015-12-17 ENCOUNTER — Emergency Department (HOSPITAL_COMMUNITY)
Admission: EM | Admit: 2015-12-17 | Discharge: 2015-12-17 | Disposition: A | Discharge status: Home / Self Care | Payer: BLUE CROSS/BLUE SHIELD | Attending: Emergency Medicine | Admitting: Emergency Medicine

## 2015-12-17 ENCOUNTER — Encounter (HOSPITAL_COMMUNITY): Payer: Self-pay

## 2015-12-17 DIAGNOSIS — O9989 Other specified diseases and conditions complicating pregnancy, childbirth and the puerperium: Secondary | ICD-10-CM | POA: Diagnosis not present

## 2015-12-17 DIAGNOSIS — O210 Mild hyperemesis gravidarum: Secondary | ICD-10-CM | POA: Insufficient documentation

## 2015-12-17 DIAGNOSIS — Z872 Personal history of diseases of the skin and subcutaneous tissue: Secondary | ICD-10-CM | POA: Diagnosis not present

## 2015-12-17 DIAGNOSIS — Z79899 Other long term (current) drug therapy: Secondary | ICD-10-CM | POA: Diagnosis not present

## 2015-12-17 DIAGNOSIS — Z3A08 8 weeks gestation of pregnancy: Secondary | ICD-10-CM | POA: Diagnosis not present

## 2015-12-17 DIAGNOSIS — Z8659 Personal history of other mental and behavioral disorders: Secondary | ICD-10-CM | POA: Insufficient documentation

## 2015-12-17 DIAGNOSIS — R1084 Generalized abdominal pain: Secondary | ICD-10-CM | POA: Insufficient documentation

## 2015-12-17 LAB — COMPREHENSIVE METABOLIC PANEL
ALK PHOS: 43 U/L (ref 38–126)
ALT: 18 U/L (ref 14–54)
ANION GAP: 15 (ref 5–15)
AST: 18 U/L (ref 15–41)
Albumin: 4.5 g/dL (ref 3.5–5.0)
BILIRUBIN TOTAL: 1.4 mg/dL — AB (ref 0.3–1.2)
BUN: 16 mg/dL (ref 6–20)
CALCIUM: 10.4 mg/dL — AB (ref 8.9–10.3)
CO2: 16 mmol/L — ABNORMAL LOW (ref 22–32)
CREATININE: 0.63 mg/dL (ref 0.44–1.00)
Chloride: 106 mmol/L (ref 101–111)
Glucose, Bld: 71 mg/dL (ref 65–99)
Potassium: 3.9 mmol/L (ref 3.5–5.1)
SODIUM: 137 mmol/L (ref 135–145)
TOTAL PROTEIN: 8.7 g/dL — AB (ref 6.5–8.1)

## 2015-12-17 LAB — URINALYSIS, ROUTINE W REFLEX MICROSCOPIC
BILIRUBIN URINE: NEGATIVE
Glucose, UA: NEGATIVE mg/dL
Ketones, ur: >80 mg/dL — AB
Leukocytes, UA: NEGATIVE
NITRITE: NEGATIVE
PROTEIN: 30 mg/dL — AB
SPECIFIC GRAVITY, URINE: 1.034 — AB (ref 1.005–1.030)
pH: 6 (ref 5.0–8.0)

## 2015-12-17 LAB — URINE MICROSCOPIC-ADD ON

## 2015-12-17 LAB — LIPASE, BLOOD: Lipase: 47 U/L (ref 11–51)

## 2015-12-17 LAB — CBC
HCT: 37.3 % (ref 36.0–46.0)
HEMOGLOBIN: 12.9 g/dL (ref 12.0–15.0)
MCH: 29.5 pg (ref 26.0–34.0)
MCHC: 34.6 g/dL (ref 30.0–36.0)
MCV: 85.2 fL (ref 78.0–100.0)
PLATELETS: 317 10*3/uL (ref 150–400)
RBC: 4.38 MIL/uL (ref 3.87–5.11)
RDW: 12.7 % (ref 11.5–15.5)
WBC: 8.8 10*3/uL (ref 4.0–10.5)

## 2015-12-17 LAB — HCG, QUANTITATIVE, PREGNANCY: HCG, BETA CHAIN, QUANT, S: 246956 m[IU]/mL — AB (ref <5)

## 2015-12-17 MED ORDER — SODIUM CHLORIDE 0.9 % IV BOLUS (SEPSIS)
500.0000 mL | Freq: Once | INTRAVENOUS | Status: AC
  Administered 2015-12-17: 500 mL via INTRAVENOUS

## 2015-12-17 MED ORDER — DIPHENHYDRAMINE HCL 50 MG/ML IJ SOLN
50.0000 mg | Freq: Once | INTRAMUSCULAR | Status: AC
  Administered 2015-12-17: 50 mg via INTRAVENOUS
  Filled 2015-12-17: qty 1

## 2015-12-17 MED ORDER — SODIUM CHLORIDE 0.9 % IV BOLUS (SEPSIS)
1000.0000 mL | Freq: Once | INTRAVENOUS | Status: AC
  Administered 2015-12-17: 1000 mL via INTRAVENOUS

## 2015-12-17 MED ORDER — PYRIDOXINE HCL 100 MG/ML IJ SOLN
100.0000 mg | Freq: Once | INTRAMUSCULAR | Status: AC
  Administered 2015-12-17: 100 mg via INTRAVENOUS
  Filled 2015-12-17: qty 1

## 2015-12-17 MED ORDER — METOCLOPRAMIDE HCL 5 MG/ML IJ SOLN
10.0000 mg | Freq: Once | INTRAMUSCULAR | Status: AC
  Administered 2015-12-17: 10 mg via INTRAVENOUS
  Filled 2015-12-17: qty 2

## 2015-12-17 MED ORDER — ONDANSETRON 4 MG PO TBDP
4.0000 mg | ORAL_TABLET | Freq: Once | ORAL | Status: DC | PRN
  Filled 2015-12-17: qty 1

## 2015-12-17 NOTE — ED Notes (Signed)
Pt refusing to answer questions.  Pt offered zofran, but would not acknowledge this RN.

## 2015-12-17 NOTE — ED Provider Notes (Signed)
CSN: [id number]     Arrival date & time 12/17/15  [id number] History   First MD Initiated Contact with Patient 12/17/15 1104     Chief Complaint  Patient presents with  . Hyperemesis Gravidarum   HPI Sharon Barnett is a 25 y.o. [redacted] week pregnant female PMH significant for hyperemesis presenting with nausea and vomiting. She was recently seen here for intractable nausea/vomiting, [facility], and was discharged same day. Her family, present at bedside, states that since she was discharged, patient has had constant nausea and vomiting. Per family, patient has been on diclegis and tonight would be her last dose. They deny any complaints of fevers, chills, chest pain, shortness of breath, changes in bowel or bladder habits.  Past Medical History  Diagnosis Date  . Boils   . ADHD (attention deficit hyperactivity disorder)   . Complication of anesthesia     Hypersensitive to anesthesia (wisdom teeth)  . Vaginal Pap smear, abnormal    Past Surgical History  Procedure Laterality Date  . Wisdom tooth extraction     Family History  Problem Relation Age of Onset  . Depression Mother   . Diabetes Mother   . Hypertension Mother   . Miscarriages / [place] Mother   . Asthma Sister   . Depression Sister   . Hearing loss Sister   . Birth defects Sister   . Hyperlipidemia Sister   . Hypertension Sister   . Miscarriages / Stillbirths Sister   . Asthma Maternal Grandmother   . Cancer Maternal Grandmother   . Diabetes Maternal Grandmother   . Hypertension Maternal Grandmother   . Stroke Maternal Grandmother   . Vision loss Maternal Grandmother   . Cancer Paternal Grandmother    Social History  Substance Use Topics  . Smoking status: Never Smoker   . Smokeless tobacco: Never Used  . Alcohol Use: No   OB History    Gravida Para Term Preterm AB TAB SAB Ectopic Multiple Living   1 0 0 0 0 0 0 0 0 0      Review of Systems  Ten systems are reviewed and are negative  for acute change except as noted in the HPI  Allergies  Review of patient's allergies indicates no known allergies.  Home Medications   Prior to Admission medications   Medication Sig Start Date End Date Taking? Authorizing Provider  Doxylamine-Pyridoxine 10-10 MG TBEC Start 2 tablet every night. Give on empty stomach. Do not crush or chew. Do not use with phenergan or Zofran. 12/15/15  Yes Doreene ElandKehinde T Eniola, MD  ondansetron (ZOFRAN) 4 MG tablet Take 1 tablet (4 mg total) by mouth every 6 (six) hours. 12/01/15  Yes Hanna Patel-Mills, PA-C  prenatal vitamin w/FE, FA (PRENATAL 1 + 1) 27-1 MG TABS tablet Take 1 tablet by mouth daily at 12 noon.   Yes Historical Provider, MD  Prenatal Multivit-Min-Fe-FA (PRENATAL VITAMINS) 0.8 MG tablet Take 1 tablet by mouth daily. 12/03/15   Kathee DeltonIan D McKeag, MD  promethazine (PHENERGAN) 12.5 MG tablet Take 1 tablet (12.5 mg total) by mouth every 6 (six) hours as needed for nausea or vomiting. 12/08/15   Marny LowensteinJulie N Wenzel, PA-C  promethazine (PHENERGAN) 25 MG tablet Take 1 tablet (25 mg total) by mouth every 6 (six) hours as needed for nausea or vomiting. 12/14/15   Judeth HornErin Lawrence, NP   BP 106/71 mmHg  Pulse 79  Temp(Src) 98.3 F (36.8 C) (Oral)  Resp 18  SpO2 97%  LMP  10/20/2015 (Approximate) Physical Exam  Constitutional: She appears well-developed and well-nourished. No distress.  HENT:  Head: Normocephalic and atraumatic.  Mouth/Throat: Oropharynx is clear and moist. No oropharyngeal exudate.  Eyes: Conjunctivae are normal. Pupils are equal, round, and reactive to light. Right eye exhibits no discharge. Left eye exhibits no discharge. No scleral icterus.  Neck: No tracheal deviation present.  Cardiovascular: Normal rate, regular rhythm, normal heart sounds and intact distal pulses.  Exam reveals no gallop and no friction rub.   No murmur heard. Pulmonary/Chest: Effort normal and breath sounds normal. No respiratory distress. She has no wheezes. She has no rales.  She exhibits no tenderness.  Abdominal: Soft. Bowel sounds are normal. She exhibits no distension and no mass. There is tenderness. There is no rebound and no guarding.  Diffuse, distractible abdominal tenderness. Not localized.  Musculoskeletal: She exhibits no edema.  Lymphadenopathy:    She has no cervical adenopathy.  Neurological: She is alert. Coordination normal.  Skin: Skin is warm and dry. No rash noted. She is not diaphoretic. No erythema.  Psychiatric:  Patient lethargic and withdrawn  Nursing note and vitals reviewed.   ED Course  Procedures  Labs Review Labs Reviewed  COMPREHENSIVE METABOLIC PANEL - Abnormal; Notable for the following:    CO2 16 (*)    Calcium 10.4 (*)    Total Protein 8.7 (*)    Total Bilirubin 1.4 (*)    All other components within normal limits  URINALYSIS, ROUTINE W REFLEX MICROSCOPIC (NOT AT [facility]) - Abnormal; Notable for the following:    APPearance CLOUDY (*)    Specific Gravity, Urine 1.034 (*)    Hgb urine dipstick TRACE (*)    Ketones, ur >80 (*)    Protein, ur 30 (*)    All other components within normal limits  HCG, QUANTITATIVE, PREGNANCY - Abnormal; Notable for the following:    hCG, Beta Francene Finders [id number] (*)    All other components within normal limits  URINE MICROSCOPIC-ADD ON - Abnormal; Notable for the following:    Squamous Epithelial / LPF 6-30 (*)    Bacteria, UA FEW (*)    All other components within normal limits  LIPASE, BLOOD  CBC   MDM   Final diagnoses:  None   Patient lethargic appearing, vital signs stable. Lipase, CBC unremarkable. Urinalysis with trace hemoglobin and greater than 80 ketones, as well as proteinuria, although this is baseline. CMP demonstrates low bicarbonate of 16. In the setting of persistent nausea and vomiting as well as no anion gap, this is expected. Discussed case with Dr. Verdie Mosher- plan is to control N/V and reassess.   Dr. Verdie Mosher informed me that pharmacist feels family is not allowing  patient to talk and that she is normally bubbly and talkative at baseline. I was told by family that patient has not been talking because she doesn't feel well and that she is afraid of further N/V if she speaks. Social work consult placed.  Social work does not feel patient feels threatened.   Patient tolerating PO here. She has a Rx for suppository phenergan which I feel may help control N/V more. Patient may be safely discharged home. Discussed reasons for return. Patient to follow-up with ob/gyn. Patient in understanding and agreement with the plan.  Melton Krebs, PA-C 12/22/15 0454  Lavera Guise, MD 12/22/15 [phone]

## 2015-12-17 NOTE — Telephone Encounter (Signed)
Received call from patient's mother. Wants her daughter admitted to the hospital because she had constant nausea and vomiting since finding out she was pregnant. [facility]. Informed the patient's mother that there was nothing I could do to admit her daughter to the hospital. Mother requested copies of her daughter's medical records and I told her that her daughter would have to sign a waiver. She had no further questions.  Katina Degreealeb M. Jimmey RalphParker, MD [facility] Resident PGY-2 12/17/2015 10:50 AM

## 2015-12-17 NOTE — Discharge Instructions (Signed)
Ms. Irish Lackynneesha C Wangerin,  Nice meeting you! Please follow-up with your gynecologist. After your dose of diclegis tonight, please start taking the phenergan suppositories. Return to the emergency department if you continue to vomit, have increased abdominal pain. Feel better soon!  S. Lane HackerNicole Nashonda Limberg, PA-C Eating Plan for Hyperemesis Gravidarum Severe cases of hyperemesis gravidarum can lead to dehydration and malnutrition. The hyperemesis eating plan is one way to lessen the symptoms of nausea and vomiting. It is often used with prescribed medicines to control your symptoms.  WHAT CAN I DO TO RELIEVE MY SYMPTOMS? Listen to your body. Everyone is different and has different preferences. Find what works best for you. Some of the following things may help:  Eat and drink slowly.  Eat 5-6 small meals daily instead of 3 large meals.   Eat crackers before you get out of bed in the morning.   Starchy foods are usually well tolerated (such as cereal, toast, bread, potatoes, pasta, rice, and pretzels).   Ginger may help with nausea. Add  tsp ground ginger to hot tea or choose ginger tea.   Try drinking 100% fruit juice or an electrolyte drink.  Continue to take your prenatal vitamins as directed by your health care provider. If you are having trouble taking your prenatal vitamins, talk with your health care provider about different options.  Include at least 1 serving of protein with your meals and snacks (such as meats or poultry, beans, nuts, eggs, or yogurt). Try eating a protein-rich snack before bed (such as cheese and crackers or a half Malaw[place]key or peanut butter sandwich). WHAT THINGS SHOULD I AVOID TO REDUCE MY SYMPTOMS? The following things may help reduce your symptoms:  Avoid foods with strong smells. Try eating meals in well-ventilated areas that are free of odors.  Avoid drinking water or other beverages with meals. Try not to drink anything less than 30 minutes before and after  meals.  Avoid drinking more than 1 cup of fluid at a time.  Avoid fried or high-fat foods, such as butter and cream sauces.  Avoid spicy foods.  Avoid skipping meals the best you can. Nausea can be more intense on an empty stomach. If you cannot tolerate food at that time, do not force it. Try sucking on ice chips or other frozen items and make up the calories later.  Avoid lying down within 2 hours after eating.   This information is not intended to replace advice given to you by your health care provider. Make sure you discuss any questions you have with your health care provider.   Document Released: 06/18/2007 Document Revised: 08/26/2013 Document Reviewed: 06/25/2013 Elsevier Interactive Patient Education [employer]2016 Elsevier Inc.

## 2015-12-17 NOTE — Telephone Encounter (Signed)
Emergency Line / After Hours Call  Mother of patient is calling as she is having continued vomiting. She has been diagnosed with hyperemesis and [facility] They have provided medications but she has continued emesis while awake. She is no longer vomiting when she is asleep. She is wanting something else for her emesis. I [facility] if she is having uncontrolled emesis and not being able to control it during the day.   Myra RudeJeremy E Tzipora Mcinroy, MD PGY-3, [facility] 12/17/2015, 12:35 AM

## 2015-12-17 NOTE — Progress Notes (Signed)
CSW was consulted by PA to speak with patient regarding possible safety concerns at home.  CSW met with patient at bedside. Patient was alert and oriented. Patient informed CSW that she lives in [place]. Patient states that she has a roommate. However, she states that tonight she will be going back home with her mother.  Patient states that she feels safe to return home. Also, she states that she feels as though she has a good support system. Patient states that her friend Janeal Holmes and mom are her primary support.   Patient informed CSW that she plans to apply for medicaid.  CSW provided patient with community resources and outpatient therapy resources.  Willette Brace [phone] ED CSW 12/17/2015 6:32 PM

## 2015-12-17 NOTE — Progress Notes (Signed)
[facility] consulted by EDPA for possible safety concerns at home.  [facility] EDSW who will speak to patient.

## 2015-12-17 NOTE — ED Notes (Signed)
Per EMS, Pt, from home, c/o hyperemesis.  Denies pain.  Pt is 2 months pregnant and has been seen several times for same.  Per chart review, Pt called PCP this morning, asked for something further for nausea, and [facility]  NAD noted.  No vomiting en route or in Triage.

## 2015-12-17 NOTE — ED Notes (Signed)
Patient asleep/non verbal at the moment, PA at bedside. Tried to get patient to sit up in bed so PA could do exam. Aware we need urine, unable to go

## 2015-12-17 NOTE — ED Notes (Signed)
PT REFUSES TO ANSWER ABOUT HER NAME OR STATE HER DATE OF BIRTH THEREFORE LABS CAN NOT BE DRAWN AT THIS TIME. RN MADE AWARE

## 2015-12-20 ENCOUNTER — Telehealth: Payer: Self-pay | Admitting: Family Medicine

## 2015-12-20 ENCOUNTER — Telehealth (HOSPITAL_COMMUNITY): Payer: Self-pay | Admitting: Family Medicine

## 2015-12-20 NOTE — Telephone Encounter (Signed)
Received call from patient's mother. Said that the patient was having persistent nausea and vomiting today. She was able to tolerate a small amount of chicken and fluids yesterday. She is currently not having any lethargy, but did notice a small burning sensation when having her episodes of emesis. She has not been able to tolerate anything PO all day. Instructed the patient's mother that it was impossible for me to make an assessment over the phone, but if she was significantly worried, then they should go back to the emergency room. If she was tolerating small amounts of PO, I told the mother that [facility] by the same-day provider. Mother voice understanding and had no further questions.  Katina Degreealeb M. Jimmey RalphParker, MD [facility] Resident PGY-2 12/20/2015 6:38 PM

## 2015-12-20 NOTE — Telephone Encounter (Addendum)
I called to speak with the mother of a 25 year old boy but the grandmother of the patient (Cummings,Shonda L)  picked up the phone instead. She then asked about a script that was given to RadioSh[employer]neesha Albea but she lost it. She also said she need a letter from her PCP stating she is pregnant. I advised her to have her daughter come in soon to see her PCP and that I will forward this message to her PCP to follow up with prescription and letter for her to apply for medicaid. She agreed with plan.  She gave a number to reach her directly: [phone]

## 2015-12-20 NOTE — Telephone Encounter (Signed)
I would be happy to provide a note stating her pregnancy status and to discuss providing a new prescription during an office visit. Patient currently has no future appts scheduled. Will wait for her to schedule this.

## 2015-12-21 ENCOUNTER — Ambulatory Visit (INDEPENDENT_AMBULATORY_CARE_PROVIDER_SITE_OTHER): Payer: BLUE CROSS/BLUE SHIELD | Admitting: Family Medicine

## 2015-12-21 ENCOUNTER — Observation Stay (HOSPITAL_COMMUNITY)
Admission: AD | Admit: 2015-12-21 | Discharge: 2015-12-22 | Disposition: A | Discharge status: Home / Self Care | Payer: BLUE CROSS/BLUE SHIELD | Source: Ambulatory Visit | Attending: Family Medicine | Admitting: Family Medicine

## 2015-12-21 ENCOUNTER — Encounter (HOSPITAL_COMMUNITY): Payer: Self-pay | Admitting: *Deleted

## 2015-12-21 ENCOUNTER — Encounter: Payer: Self-pay | Admitting: Family Medicine

## 2015-12-21 VITALS — BP 100/70 | HR 106 | Wt 99.6 lb

## 2015-12-21 DIAGNOSIS — F909 Attention-deficit hyperactivity disorder, unspecified type: Secondary | ICD-10-CM | POA: Insufficient documentation

## 2015-12-21 DIAGNOSIS — Z3A08 8 weeks gestation of pregnancy: Secondary | ICD-10-CM | POA: Diagnosis not present

## 2015-12-21 DIAGNOSIS — O99341 Other mental disorders complicating pregnancy, first trimester: Secondary | ICD-10-CM | POA: Insufficient documentation

## 2015-12-21 DIAGNOSIS — O9989 Other specified diseases and conditions complicating pregnancy, childbirth and the puerperium: Secondary | ICD-10-CM | POA: Insufficient documentation

## 2015-12-21 DIAGNOSIS — R5383 Other fatigue: Secondary | ICD-10-CM | POA: Diagnosis not present

## 2015-12-21 DIAGNOSIS — O211 Hyperemesis gravidarum with metabolic disturbance: Principal | ICD-10-CM | POA: Diagnosis present

## 2015-12-21 DIAGNOSIS — R000 Tachycardia, unspecified: Secondary | ICD-10-CM | POA: Diagnosis not present

## 2015-12-21 DIAGNOSIS — Z79899 Other long term (current) drug therapy: Secondary | ICD-10-CM | POA: Insufficient documentation

## 2015-12-21 LAB — CBC
HEMATOCRIT: 39 % (ref 36.0–46.0)
Hemoglobin: 13.8 g/dL (ref 12.0–15.0)
MCH: 30 pg (ref 26.0–34.0)
MCHC: 35.4 g/dL (ref 30.0–36.0)
MCV: 84.8 fL (ref 78.0–100.0)
PLATELETS: 259 10*3/uL (ref 150–400)
RBC: 4.6 MIL/uL (ref 3.87–5.11)
RDW: 12.8 % (ref 11.5–15.5)
WBC: 8.5 10*3/uL (ref 4.0–10.5)

## 2015-12-21 LAB — COMPREHENSIVE METABOLIC PANEL
ALBUMIN: 4 g/dL (ref 3.5–5.0)
ALT: 21 U/L (ref 14–54)
ANION GAP: 9 (ref 5–15)
AST: 22 U/L (ref 15–41)
Alkaline Phosphatase: 47 U/L (ref 38–126)
BILIRUBIN TOTAL: 0.7 mg/dL (ref 0.3–1.2)
BUN: 17 mg/dL (ref 6–20)
CALCIUM: 10 mg/dL (ref 8.9–10.3)
CO2: 27 mmol/L (ref 22–32)
CREATININE: 0.73 mg/dL (ref 0.44–1.00)
Chloride: 100 mmol/L — ABNORMAL LOW (ref 101–111)
GFR calc Af Amer: >60 mL/min (ref >60)
GFR calc non Af Amer: >60 mL/min (ref >60)
GLUCOSE: 122 mg/dL — AB (ref 65–99)
Potassium: 3.4 mmol/L — ABNORMAL LOW (ref 3.5–5.1)
SODIUM: 136 mmol/L (ref 135–145)
Total Protein: 8.3 g/dL — ABNORMAL HIGH (ref 6.5–8.1)

## 2015-12-21 LAB — GLUCOSE, CAPILLARY: GLUCOSE-CAPILLARY: 109 mg/dL — AB (ref 65–99)

## 2015-12-21 MED ORDER — SODIUM CHLORIDE 0.9 % IV SOLN
8.0000 mg | Freq: Once | INTRAVENOUS | Status: AC
  Administered 2015-12-21: 8 mg via INTRAVENOUS
  Filled 2015-12-21: qty 4

## 2015-12-21 MED ORDER — LACTATED RINGERS IV SOLN
INTRAVENOUS | Status: DC
Start: 2015-12-21 — End: 2015-12-22
  Administered 2015-12-22 (×2): via INTRAVENOUS

## 2015-12-21 MED ORDER — PROMETHAZINE HCL 25 MG RE SUPP: 25.0000 mg | Freq: Four times a day (QID) | RECTAL | Status: DC | PRN

## 2015-12-21 MED ORDER — PANTOPRAZOLE SODIUM 40 MG IV SOLR
40.0000 mg | Freq: Every day | INTRAVENOUS | Status: DC
  Administered 2015-12-21: 40 mg via INTRAVENOUS
  Filled 2015-12-21: qty 40

## 2015-12-21 MED ORDER — GLYCOPYRROLATE 1 MG PO TABS
2.0000 mg | ORAL_TABLET | Freq: Three times a day (TID) | ORAL | Status: DC
Start: 2015-12-22 — End: 2015-12-22
  Administered 2015-12-22 (×2): 2 mg via ORAL
  Filled 2015-12-21 (×5): qty 2

## 2015-12-21 MED ORDER — GLYCOPYRROLATE 1 MG PO TABS
2.0000 mg | ORAL_TABLET | Freq: Once | ORAL | Status: AC
  Administered 2015-12-21: 2 mg via ORAL
  Filled 2015-12-21: qty 2

## 2015-12-21 MED ORDER — PROMETHAZINE HCL 25 MG/ML IJ SOLN: 25.0000 mg | Freq: Four times a day (QID) | INTRAMUSCULAR | Status: DC | PRN

## 2015-12-21 MED ORDER — LACTATED RINGERS IV BOLUS (SEPSIS)
1000.0000 mL | Freq: Once | INTRAVENOUS | Status: AC
  Administered 2015-12-21: 1000 mL via INTRAVENOUS

## 2015-12-21 MED ORDER — ONDANSETRON HCL 4 MG/2ML IJ SOLN: 8.0000 mg | Freq: Once | INTRAMUSCULAR | Status: DC

## 2015-12-21 MED ORDER — ONDANSETRON HCL 4 MG/2ML IJ SOLN: 4.0000 mg | Freq: Four times a day (QID) | INTRAMUSCULAR | Status: DC | PRN

## 2015-12-21 MED ORDER — ONDANSETRON HCL 4 MG PO TABS: 4.0000 mg | ORAL_TABLET | Freq: Four times a day (QID) | ORAL | Status: DC | PRN

## 2015-12-21 MED ORDER — M.V.I. ADULT IV INJ
Freq: Once | INTRAVENOUS | Status: AC
  Administered 2015-12-21: 20:00:00 via INTRAVENOUS
  Filled 2015-12-21: qty 1000

## 2015-12-21 MED ORDER — PROMETHAZINE HCL 25 MG PO TABS: 25.0000 mg | ORAL_TABLET | Freq: Four times a day (QID) | ORAL | Status: DC | PRN

## 2015-12-21 MED ORDER — PROMETHAZINE HCL 25 MG/ML IJ SOLN
25.0000 mg | Freq: Once | INTRAVENOUS | Status: AC
  Administered 2015-12-21: 25 mg via INTRAVENOUS
  Filled 2015-12-21: qty 1

## 2015-12-21 NOTE — MAU Provider Note (Signed)
See H& P.

## 2015-12-21 NOTE — MAU Note (Signed)
Sent from dr's office, has lost 10# in the past month. (family says 20).  Ongoing vomiting

## 2015-12-21 NOTE — H&P (Signed)
HPI: Sharon Barnett is a 25 y.o. year old [id number] female at [redacted]w[redacted]d weeks gestation who was sent to [facility] from [facility] for hyperemesis. Patient was seen in the office today and was extremely weak and dehydrated-appearing. Patient was unable to walk and to MAU.   Patient has been seen in ED/MAU 4 times for this problem, office 2 times for this problem and exchanged multiple telephone calls to the office. Has tried Phenergan tablets, Zofran tablets, Reglan tablet, Diclegis without improvement. Has not been able to keep down medications. Was prescribed Phenergan suppositories, but was told the next day not to use the Phenergan suppositories and was given Diclegis instead.   Pt's prepregnancy weight was 124 pounds. Is 99 pounds today reflecting a 25 pound (20%) weight loss.   Live IUP confirmed by ultrasound 12/01/2015.  1 L D5LR with 25 mg of Phenergan, 1 L multivitamin bag, 8 mg Zofran, Robinul given in MAU. CBC and CMP essentially normal. Patient unable to void in maternity admissions  OB History  Gravida Para Term Preterm AB SAB TAB Ectopic Multiple Living     # Outcome Date GA Lbr Len/2nd Weight Sex Delivery Anes PTL Lv  1 Current               Past Medical History  Diagnosis Date  . Boils   . ADHD (attention deficit hyperactivity disorder)   . Complication of anesthesia     Hypersensitive to anesthesia (wisdom teeth)  . Vaginal Pap smear, abnormal     Past Surgical History  Procedure Laterality Date  . Wisdom tooth extraction      Family History  Problem Relation Age of Onset  . Depression Mother   . Diabetes Mother   . Hypertension Mother   . Miscarriages / [place] Mother   . Asthma Sister   . Depression Sister   . Hearing loss Sister   . Birth defects Sister   . Hyperlipidemia Sister   . Hypertension Sister   . Miscarriages / Stillbirths Sister   . Asthma Maternal Grandmother   . Cancer Maternal Grandmother   . Diabetes  Maternal Grandmother   . Hypertension Maternal Grandmother   . Stroke Maternal Grandmother   . Vision loss Maternal Grandmother   . Cancer Paternal Grandmother     Social History:  reports that she has never smoked. She has never used smokeless tobacco. She reports that she does not drink alcohol or use illicit drugs.  Allergies: No Known Allergies  Prescriptions prior to admission  Medication Sig Dispense Refill Last Dose  . Doxylamine-Pyridoxine 10-10 MG TBEC Start 2 tablet every night. Give on empty stomach. Do not crush or chew. Do not use with phenergan or Zofran. (Patient not taking: Reported on 12/21/2015) 6 tablet 0 unknown  . ondansetron (ZOFRAN) 4 MG tablet Take 1 tablet (4 mg total) by mouth every 6 (six) hours. (Patient not taking: Reported on 12/21/2015) 12 tablet 0 unknwon  . Prenatal Multivit-Min-Fe-FA (PRENATAL VITAMINS) 0.8 MG tablet Take 1 tablet by mouth daily. (Patient not taking: Reported on 12/21/2015) 30 tablet 11 Not Taking at Unknown time  . prenatal vitamin w/FE, FA (PRENATAL 1 + 1) 27-1 MG TABS tablet Take 1 tablet by mouth daily at 12 noon.   unknown  . promethazine (PHENERGAN) 12.5 MG tablet Take 1 tablet (12.5 mg total) by mouth every 6 (six) hours as needed for nausea or vomiting. (Patient not taking:  Reported on 12/21/2015) 30 tablet 0 not filled  . promethazine (PHENERGAN) 25 MG tablet Take 1 tablet (25 mg total) by mouth every 6 (six) hours as needed for nausea or vomiting. (Patient not taking: Reported on 12/21/2015) 30 tablet 0 not filled    Review of Systems  Unable to perform ROS: medical condition    Blood pressure 108/80, pulse 116, temperature 98.5 F (36.9 C), resp. rate 20, last menstrual period 10/20/2015. Physical Exam  Constitutional: She appears well-developed. She appears lethargic. She is cooperative.  Non-toxic appearance. No distress.  Barely responsive  HENT:  Head: Atraumatic.  Mouth/Throat: Mucous membranes are dry.  Cardiovascular:  Tachycardia present.   Respiratory: Effort normal and breath sounds normal. No respiratory distress.  GI: Soft. Bowel sounds are decreased. There is no tenderness.  Neurological: She appears lethargic. She is not disoriented.  Skin: Skin is warm and dry.    Results for orders placed or performed during the hospital encounter of 12/21/15 (from the past 24 hour(s))  CBC     Status: None   Collection Time: 12/21/15  6:28 PM  Result Value Ref Range   WBC 8.5 4.0 - 10.5 K/uL   RBC 4.60 3.87 - 5.11 MIL/uL   Hemoglobin 13.8 12.0 - 15.0 g/dL   HCT 16.139.0 09.636.0 - 04.546.0 %   MCV 84.8 78.0 - 100.0 fL   MCH 30.0 26.0 - 34.0 pg   MCHC 35.4 30.0 - 36.0 g/dL   RDW 40.912.8 81.111.5 - 91.415.5 %   Platelets 259 150 - 400 K/uL  Comprehensive metabolic panel     Status: Abnormal   Collection Time: 12/21/15  6:28 PM  Result Value Ref Range   Sodium 136 135 - 145 mmol/L   Potassium 3.4 (L) 3.5 - 5.1 mmol/L   Chloride 100 (L) 101 - 111 mmol/L   CO2 27 22 - 32 mmol/L   Glucose, Bld 122 (H) 65 - 99 mg/dL   BUN 17 6 - 20 mg/dL   Creatinine, Ser 7.820.73 0.44 - 1.00 mg/dL   Calcium 95.610.0 8.9 - 21.310.3 mg/dL   Total Protein 8.3 (H) 6.5 - 8.1 g/dL   Albumin 4.0 3.5 - 5.0 g/dL   AST 22 15 - 41 U/L   ALT 21 14 - 54 U/L   Alkaline Phosphatase 47 38 - 126 U/L   Total Bilirubin 0.7 0.3 - 1.2 mg/dL   GFR calc non Af Amer >60 >60 mL/min   GFR calc Af Amer >60 >60 mL/min   Anion gap 9 5 - 15    Assessment Severe hyperemesis  Plan 23 hour Obs per consult with Dr. Shawnie PonsPratt. Phenergan, Zofran, Robinul, Protonix Will give additional liter of IV fluids since patient has not yet voided. Informed patient and her mother the patient will need to stay on a schedule of Phenergan and Zofran around-the-clock after discharge. Recommend Zofran ODT and Phenergan suppositories.  Dorathy KinsmanSMITH, Lavilla Delamora 12/21/2015, 8:48 PM

## 2015-12-21 NOTE — Progress Notes (Signed)
Patients came into clinic today with her mother and family friend.  She appeared very weak and unsteady on her feet.  She did walk into the office but was wheeled back to the exam room.  Patient had delayed response when asking pain scale and what her chief complaint was.  Mom expressed concern due to her 10lb weight loss and also her weakness.  States that patient has only eaten a few bites of eggs this morning and nothing since.  She was unable to keep fluids down either and has multiple episodes of vomiting.  Patient was dry heaving in room and was dozing off during the few minutes she was here.  I spoke with a preceptor, Dr. Leveda AnnaHensel, who came in and advised patient that she would benefit the most from being seen at the MAU.  He informed the patient and her family that she would need fluids and to be observed.  Mom voiced understanding and I helped escort patient to their vehicle.  I also gave mom some pediasure we sample here in the office to try with patient when she is able to hold some fluids down to see if this will help her maintain her weight and even put some on.  I called MAU and spoke with the charge nurse and gave report regarding this patient's current status. Muriah Harsha,CMA

## 2015-12-21 NOTE — MAU Note (Signed)
Pt very weak, needed assistance to stand. Unable to void at this time

## 2015-12-22 DIAGNOSIS — O211 Hyperemesis gravidarum with metabolic disturbance: Principal | ICD-10-CM

## 2015-12-22 MED ORDER — PANTOPRAZOLE SODIUM 40 MG PO TBEC: 40.0000 mg | DELAYED_RELEASE_TABLET | Freq: Every day | ORAL | Status: DC

## 2015-12-22 MED ORDER — BISACODYL 10 MG RE SUPP
10.0000 mg | Freq: Once | RECTAL | Status: AC
  Administered 2015-12-22: 10 mg via RECTAL
  Filled 2015-12-22: qty 1

## 2015-12-22 MED ORDER — PROMETHAZINE HCL 25 MG PO TABS: 25.0000 mg | ORAL_TABLET | Freq: Four times a day (QID) | ORAL | Status: DC | PRN

## 2015-12-22 MED ORDER — ONDANSETRON HCL 8 MG PO TABS: 8.0000 mg | ORAL_TABLET | Freq: Four times a day (QID) | ORAL | Status: DC

## 2015-12-22 MED ORDER — GLYCOPYRROLATE 1 MG PO TABS: 2.0000 mg | ORAL_TABLET | Freq: Three times a day (TID) | ORAL | Status: DC

## 2015-12-22 NOTE — Progress Notes (Signed)
Initial Nutrition Assessment  DOCUMENTATION CODES:   Non-severe (moderate) malnutrition in context of acute illness/injury  INTERVENTION:  C/L advanced to regular diet Counseled to eat small frequent meals when discharged home, low fat foods  NUTRITION DIAGNOSIS:   Malnutrition related to nausea, vomiting as evidenced by energy intake < 75% for > 7 days, 12 %percent weight loss.  GOAL:   Other (Comment) (tolerance of po diet)  MONITOR:  PO intake (Regular diet)  REASON FOR ASSESSMENT:   Malnutrition Screening Tool   ASSESSMENT:   reports 3 - 4 weeks Hx of n/v, excessive weight loss. Unable to tolerate a significant amt of solid food for 3 weeks. Tolerating some solid food today  Diet Order:  Diet regular Room service appropriate?: Yes; Fluid consistency:: Thin  Skin:  Reviewed, no issues  Height:   Ht Readings from Last 1 Encounters:  12/14/15 5\' 8"  (1.727 m)   Weight:   Wt Readings from Last 1 Encounters:  12/22/15 109 lb (49.443 kg)   Ideal Body Weight:  140 lbs  BMI:  Body mass index is 16.58 kg/(m^2). pre-preg BMI 18.9  Estimated Nutritional Needs:   Kcal:  1600-1800  Protein:  65-75 g  Fluid:  2 L  EDUCATION NEEDS:   Education needs addressed  Verbally reviewed basic guidelines for diet during hyperemesis Elisabeth CaraKatherine Lulubelle Simcoe M.Odis LusterEd. R.D. LDN Neonatal Nutrition Support Specialist/RD III Pager [phone]      Phone [phone]

## 2015-12-22 NOTE — Discharge Instructions (Signed)
Hyperemesis Gravidarum  Hyperemesis gravidarum is a severe form of nausea and vomiting that happens during pregnancy. Hyperemesis is worse than morning sickness. It may cause you to have nausea or vomiting all day for many days. It may keep you from eating and drinking enough food and liquids. Hyperemesis usually occurs during the first half (the first 20 weeks) of pregnancy. It often goes away once a woman is in her second half of pregnancy. However, sometimes hyperemesis continues through an entire pregnancy.   CAUSES   The cause of this condition is not completely known but is thought to be related to changes in the body's hormones when pregnant. It could be from the high level of the pregnancy hormone or an increase in estrogen in the body.   SIGNS AND SYMPTOMS    Severe nausea and vomiting.   Nausea that does not go away.   Vomiting that does not allow you to keep any food down.   Weight loss and body fluid loss (dehydration).   Having no desire to eat or not liking food you have previously enjoyed.  DIAGNOSIS   Your health care provider will do a physical exam and ask you about your symptoms. He or she may also order blood tests and urine tests to make sure something else is not causing the problem.   TREATMENT   You may only need medicine to control the problem. If medicines do not control the nausea and vomiting, you will be treated in the hospital to prevent dehydration, increased acid in the blood (acidosis), weight loss, and changes in the electrolytes in your body that may harm the unborn baby (fetus). You may need IV fluids.   HOME CARE INSTRUCTIONS    Only take over-the-counter or prescription medicines as directed by your health care provider.   Try eating a couple of dry crackers or toast in the morning before getting out of bed.   Avoid foods and smells that upset your stomach.   Avoid fatty and spicy foods.   Eat 5-6 small meals a day.   Do not drink when eating meals. Drink between  meals.   For snacks, eat high-protein foods, such as cheese.   Eat or suck on things that have ginger in them. Ginger helps nausea.   Avoid food preparation. The smell of food can spoil your appetite.   Avoid iron pills and iron in your multivitamins until after 3-4 months of being pregnant. However, consult with your health care provider before stopping any prescribed iron pills.  SEEK MEDICAL CARE IF:    Your abdominal pain increases.   You have a severe headache.   You have vision problems.   You are losing weight.  SEEK IMMEDIATE MEDICAL CARE IF:    You are unable to keep fluids down.   You vomit blood.   You have constant nausea and vomiting.   You have excessive weakness.   You have extreme thirst.   You have dizziness or fainting.   You have a fever or persistent symptoms for more than 2-3 days.   You have a fever and your symptoms suddenly get worse.  MAKE SURE YOU:    Understand these instructions.   Will watch your condition.   Will get help right away if you are not doing well or get worse.     This information is not intended to replace advice given to you by your health care provider. Make sure you discuss any questions you have with   your health care provider.     Document Released: 08/21/2005 Document Revised: 06/11/2013 Document Reviewed: 04/02/2013  Elsevier Interactive Patient Education 2016 [employer] Inc[employer]

## 2015-12-22 NOTE — Care Management Note (Signed)
Case Management Note  Patient Details  Name: Sharon Barnett MRN: [id number] Date of Birth: 11/28/1990                     Additional Comments:  Received call from Dr. Roselie Awkward and he wanted me to talk to patient about cost of her discharge medications.  CM met with patient in room and verified that she does have private insurance of [employer] and works at [employer] in [employer].  CM called the pharmacy at [facility] and spoke to the pharmacist [place] and she verified the cost of the medications of patient has:  Phenergan $4, robinul $4, Zofran 85m $4, and the Protonix is $69.81.  Called Dr. ARoselie Awkwardback and given the cost of the medications and also reviewed with the patient.  Patient stated she can afford all the $4 meds and not sure about the Protonix.  Dr. ARoselie Awkwardaware and no other orders for meds given at this time.  Patient has private insurance so she is NOT [facility].  Patient stated she is constipated and Dr. MArville Goaware and orders received for Duccolax supp x1 if patient desires.  Financial counselor verified that patient does have private insurance.   GYong Channel RN 12/22/2015, 5:10 PM

## 2015-12-22 NOTE — Progress Notes (Signed)
Patient ID: Sharon Barnett, female   DOB: Apr 08, 1991, 25 y.o.   MRN: [id number]  Pt. Seen in the clinic by the preceptor. Nausea and vomiting with significant dehydration. [facility] given pregnancy. She was not seen by myself in clinic.   Devota Pacealeb Aseneth Hack, MD

## 2015-12-22 NOTE — Discharge Summary (Signed)
Physician Discharge Summary  Patient ID: Sharon Barnett MRN: [id number] DOB/AGE: 1991/06/15 25 y.o.  Admit date: 12/21/2015 Discharge date: 12/22/2015  Admission Diagnoses:  Discharge Diagnoses:  Active Problems:   Severe hyperemesis gravidarum   Discharged Condition: fair  Expand All Collapse All   HPI: Sharon Barnett is a 25 y.o. year old G62P0000 female at [redacted]w[redacted]d weeks gestation who was sent to [facility] from [facility] for hyperemesis. Patient was seen in the office today and was extremely weak and dehydrated-appearing. Patient was unable to walk and to MAU.   Patient has been seen in ED/MAU 4 times for this problem, office 2 times for this problem and exchanged multiple telephone calls to the office. Has tried Phenergan tablets, Zofran tablets, Reglan tablet, Diclegis without improvement. Has not been able to keep down medications. Was prescribed Phenergan suppositories, but was told the next day not to use the Phenergan suppositories and was given Diclegis instead.   Pt's prepregnancy weight was 124 pounds. Is 99 pounds today reflecting a 25 pound (20%) weight loss.   Live IUP confirmed by ultrasound 12/01/2015.  1 L D5LR with 25 mg of Phenergan, 1 L multivitamin bag, 8 mg Zofran, Robinul given in MAU. CBC and CMP essentially normal. Patient unable to void in maternity admissions  OB History  Gravida Para Term Preterm AB SAB TAB Ectopic Multiple Living     # Outcome Date GA Lbr Len/2nd Weight Sex Delivery Anes PTL Lv  1 Current               Past Medical History  Diagnosis Date  . Boils   . ADHD (attention deficit hyperactivity disorder)   . Complication of anesthesia     Hypersensitive to anesthesia (wisdom teeth)  . Vaginal Pap smear, abnormal     Past Surgical History  Procedure Laterality Date  . Wisdom tooth extraction      Family History   Problem Relation Age of Onset  . Depression Mother   . Diabetes Mother   . Hypertension Mother   . Miscarriages / [place] Mother   . Asthma Sister   . Depression Sister   . Hearing loss Sister   . Birth defects Sister   . Hyperlipidemia Sister   . Hypertension Sister   . Miscarriages / Stillbirths Sister   . Asthma Maternal Grandmother   . Cancer Maternal Grandmother   . Diabetes Maternal Grandmother   . Hypertension Maternal Grandmother   . Stroke Maternal Grandmother   . Vision loss Maternal Grandmother   . Cancer Paternal Grandmother     Social History:  reports that she has never smoked. She has never used smokeless tobacco. She reports that she does not drink alcohol or use illicit drugs.  Allergies: No Known Allergies  Prescriptions prior to admission  Medication Sig Dispense Refill Last Dose  . Doxylamine-Pyridoxine 10-10 MG TBEC Start 2 tablet every night. Give on empty stomach. Do not crush or chew. Do not use with phenergan or Zofran. (Patient not taking: Reported on 12/21/2015) 6 tablet 0 unknown  . ondansetron (ZOFRAN) 4 MG tablet Take 1 tablet (4 mg total) by mouth every 6 (six) hours. (Patient not taking: Reported on 12/21/2015) 12 tablet 0 unknwon  . Prenatal Multivit-Min-Fe-FA (PRENATAL VITAMINS) 0.8 MG tablet Take 1 tablet by mouth daily. (Patient not taking: Reported on 12/21/2015) 30 tablet 11 Not Taking at Unknown time  . prenatal vitamin w/FE, FA (  PRENATAL 1 + 1) 27-1 MG TABS tablet Take 1 tablet by mouth daily at 12 noon.   unknown  . promethazine (PHENERGAN) 12.5 MG tablet Take 1 tablet (12.5 mg total) by mouth every 6 (six) hours as needed for nausea or vomiting. (Patient not taking: Reported on 12/21/2015) 30 tablet 0 not filled  . promethazine (PHENERGAN) 25 MG tablet Take 1 tablet (25 mg total) by mouth every 6 (six) hours as needed for  nausea or vomiting. (Patient not taking: Reported on 12/21/2015) 30 tablet 0 not filled    Review of Systems  Unable to perform ROS: medical condition    Blood pressure 108/80, pulse 116, temperature 98.5 F (36.9 C), resp. rate 20, last menstrual period 10/20/2015. Physical Exam  Constitutional: She appears well-developed. She appears lethargic. She is cooperative. Non-toxic appearance. No distress.  Barely responsive  HENT:  Head: Atraumatic.  Mouth/Throat: Mucous membranes are dry.  Cardiovascular: Tachycardia present.  Respiratory: Effort normal and breath sounds normal. No respiratory distress.  GI: Soft. Bowel sounds are decreased. There is no tenderness.  Neurological: She appears lethargic. She is not disoriented.  Skin: Skin is warm and dry.     Lab Results Last 24 Hours    Results for orders placed or performed during the hospital encounter of 12/21/15 (from the past 24 hour(s))  CBC Status: None   Collection Time: 12/21/15 6:28 PM  Result Value Ref Range   WBC 8.5 4.0 - 10.5 K/uL   RBC 4.60 3.87 - 5.11 MIL/uL   Hemoglobin 13.8 12.0 - 15.0 g/dL   HCT 09.6 04.5 - 40.9 %   MCV 84.8 78.0 - 100.0 fL   MCH 30.0 26.0 - 34.0 pg   MCHC 35.4 30.0 - 36.0 g/dL   RDW 81.1 91.4 - 78.2 %   Platelets 259 150 - 400 K/uL  Comprehensive metabolic panel Status: Abnormal   Collection Time: 12/21/15 6:28 PM  Result Value Ref Range   Sodium 136 135 - 145 mmol/L   Potassium 3.4 (L) 3.5 - 5.1 mmol/L   Chloride 100 (L) 101 - 111 mmol/L   CO2 27 22 - 32 mmol/L   Glucose, Bld 122 (H) 65 - 99 mg/dL   BUN 17 6 - 20 mg/dL   Creatinine, Ser 9.56 0.44 - 1.00 mg/dL   Calcium 21.3 8.9 - 08.6 mg/dL   Total Protein 8.3 (H) 6.5 - 8.1 g/dL   Albumin 4.0 3.5 - 5.0 g/dL   AST 22 15 - 41 U/L   ALT 21 14 - 54 U/L   Alkaline Phosphatase 47 38 - 126 U/L   Total Bilirubin 0.7  0.3 - 1.2 mg/dL   GFR calc non Af Amer >60 >60 mL/min   GFR calc Af Amer >60 >60 mL/min   Anion gap 9 5 - 15      Assessment Severe hyperemesis  Plan 23 hour Obs per consult with Dr. Shawnie Pons. Phenergan, Zofran, Robinul, Protonix Will give additional liter of IV fluids since patient has not yet voided. Informed patient and her mother the patient will need to stay on a schedule of Phenergan and Zofran around-the-clock after discharge. Recommend Zofran ODT and Phenergan suppositories.       Hospital Course: Patient was able to eat regular diet after treatment with IV fluids and antiemetics.   Consults: None  Significant Diagnostic Studies: labs:  CBC    Component Value Date/Time   WBC 8.5 12/21/2015 1828   RBC 4.60 12/21/2015 1828   HGB 13.8  12/21/2015 1828   HCT 39.0 12/21/2015 1828   PLT 259 12/21/2015 1828   MCV 84.8 12/21/2015 1828   MCH 30.0 12/21/2015 1828   MCHC 35.4 12/21/2015 1828   RDW 12.8 12/21/2015 1828   LYMPHSABS 1.6 12/14/2015 1141   MONOABS 0.3 12/14/2015 1141   EOSABS 0.0 12/14/2015 1141   BASOSABS 0.0 12/14/2015 1141    BMP Latest Ref Rng 12/21/2015 12/17/2015 12/14/2015  Glucose 65 - 99 mg/dL 161(W122(H) 71 76  BUN 6 - 20 mg/dL 17 16 15   Creatinine 0.44 - 1.00 mg/dL 9.600.73 4.540.63 0.980.62  Sodium 135 - 145 mmol/L 136 137 138  Potassium 3.5 - 5.1 mmol/L 3.4(L) 3.9 3.6  Chloride 101 - 111 mmol/L 100(L) 106 106  CO2 22 - 32 mmol/L 27 16(L) 21(L)  Calcium 8.9 - 10.3 mg/dL 11.910.0 10.4(H) 9.5       Treatments: IV hydration and antiemetic  Discharge Exam: Blood pressure 121/65, pulse 80, temperature 98.9 F (37.2 C), temperature source Oral, resp. rate 16, weight 49.443 kg (109 lb), last menstrual period 10/20/2015, SpO2 100 %. General appearance: alert, cooperative and no distress GI: non distended  Disposition: 01-Home or Self Care     Medication List    STOP taking these medications        Doxylamine-Pyridoxine 10-10 MG Tbec      TAKE  these medications        glycopyrrolate 1 MG tablet  Commonly known as:  ROBINUL  Take 2 tablets (2 mg total) by mouth every 8 (eight) hours.     ondansetron 8 MG tablet  Commonly known as:  ZOFRAN  Take 1 tablet (8 mg total) by mouth every 6 (six) hours.     pantoprazole 40 MG tablet  Commonly known as:  PROTONIX  Take 1 tablet (40 mg total) by mouth at bedtime.     prenatal vitamin w/FE, FA 27-1 MG Tabs tablet  Take 1 tablet by mouth daily at 12 noon.     Prenatal Vitamins 0.8 MG tablet  Take 1 tablet by mouth daily.     promethazine 12.5 MG tablet  Commonly known as:  PHENERGAN  Take 1 tablet (12.5 mg total) by mouth every 6 (six) hours as needed for nausea or vomiting.     promethazine 25 MG tablet  Commonly known as:  PHENERGAN  Take 1 tablet (25 mg total) by mouth every 6 (six) hours as needed for nausea or vomiting.     promethazine 25 MG tablet  Commonly known as:  PHENERGAN  Take 1 tablet (25 mg total) by mouth every 6 (six) hours as needed for nausea or vomiting.           Follow-up Information    Follow up with [facility] In 1 week.   Contact information:   [address] [place] [id number] [phone]      Signed: Scheryl DarterRNOLD,JAMES 12/22/2015, 1:50 PM

## 2015-12-22 NOTE — Progress Notes (Signed)
Pt out in wheelchair  Teaching complete   

## 2015-12-22 NOTE — Progress Notes (Signed)

## 2015-12-23 ENCOUNTER — Telehealth: Payer: Self-pay | Admitting: Advanced Practice Midwife

## 2015-12-23 NOTE — Telephone Encounter (Signed)
Called to make sure pt has been able to afford Rx's and notify pt of lower price of Zofran ODT at [facility] ($12.25 for #20). Pt not home. Mother has been filling Rx's. Wasn't able to fill due to price. Will try [facility].

## 2015-12-26 ENCOUNTER — Encounter (HOSPITAL_COMMUNITY): Payer: Self-pay | Admitting: *Deleted

## 2015-12-26 ENCOUNTER — Inpatient Hospital Stay (HOSPITAL_COMMUNITY)
Admission: AD | Admit: 2015-12-26 | Discharge: 2015-12-27 | Disposition: A | Discharge status: Home / Self Care | Payer: BLUE CROSS/BLUE SHIELD | Source: Ambulatory Visit | Attending: Obstetrics & Gynecology | Admitting: Obstetrics & Gynecology

## 2015-12-26 DIAGNOSIS — O210 Mild hyperemesis gravidarum: Secondary | ICD-10-CM | POA: Diagnosis not present

## 2015-12-26 DIAGNOSIS — Z3A00 Weeks of gestation of pregnancy not specified: Secondary | ICD-10-CM | POA: Insufficient documentation

## 2015-12-26 DIAGNOSIS — O219 Vomiting of pregnancy, unspecified: Secondary | ICD-10-CM | POA: Diagnosis not present

## 2015-12-26 DIAGNOSIS — R1110 Vomiting, unspecified: Secondary | ICD-10-CM | POA: Diagnosis present

## 2015-12-26 DIAGNOSIS — F909 Attention-deficit hyperactivity disorder, unspecified type: Secondary | ICD-10-CM | POA: Diagnosis not present

## 2015-12-26 LAB — URINALYSIS, ROUTINE W REFLEX MICROSCOPIC
GLUCOSE, UA: NEGATIVE mg/dL
Hgb urine dipstick: NEGATIVE
KETONES UR: 40 mg/dL — AB
LEUKOCYTES UA: NEGATIVE
NITRITE: NEGATIVE
PROTEIN: 100 mg/dL — AB
Specific Gravity, Urine: >1.03 — ABNORMAL HIGH (ref 1.005–1.030)
pH: 6 (ref 5.0–8.0)

## 2015-12-26 LAB — URINE MICROSCOPIC-ADD ON: RBC / HPF: NONE SEEN RBC/hpf (ref 0–5)

## 2015-12-26 MED ORDER — PROMETHAZINE HCL 25 MG/ML IJ SOLN
25.0000 mg | Freq: Once | INTRAVENOUS | Status: AC
  Administered 2015-12-26: 25 mg via INTRAVENOUS
  Filled 2015-12-26: qty 1

## 2015-12-26 NOTE — MAU Provider Note (Signed)
History     CSN: [id number]  Arrival date and time: 12/26/15 2138   First Provider Initiated Contact with Patient 12/26/15 2205      Chief Complaint  Patient presents with  . Emesis During Pregnancy   Emesis  This is a new problem. The current episode started today (patient last took meds at 10:00 am. She states that she tried to take them one other time, but  could not keep them down. She has not tried taking them rectally as advised before. ). The problem occurs more than 10 times per day. The emesis has an appearance of stomach contents. There has been no fever. Pertinent negatives include no abdominal pain, chills, diarrhea or fever. Risk factors: pregnancy  Treatments tried: phenergan, zofran  The treatment provided no relief.   Past Medical History  Diagnosis Date  . Boils   . ADHD (attention deficit hyperactivity disorder)   . Complication of anesthesia     Hypersensitive to anesthesia (wisdom teeth)  . Vaginal Pap smear, abnormal     Past Surgical History  Procedure Laterality Date  . Wisdom tooth extraction      Family History  Problem Relation Age of Onset  . Depression Mother   . Diabetes Mother   . Hypertension Mother   . Miscarriages / [place] Mother   . Asthma Sister   . Depression Sister   . Hearing loss Sister   . Birth defects Sister   . Hyperlipidemia Sister   . Hypertension Sister   . Miscarriages / Stillbirths Sister   . Asthma Maternal Grandmother   . Cancer Maternal Grandmother   . Diabetes Maternal Grandmother   . Hypertension Maternal Grandmother   . Stroke Maternal Grandmother   . Vision loss Maternal Grandmother   . Cancer Paternal Grandmother     Social History  Substance Use Topics  . Smoking status: Never Smoker   . Smokeless tobacco: Never Used  . Alcohol Use: No    Allergies: No Known Allergies  Prescriptions prior to admission  Medication Sig Dispense Refill Last Dose  . glycopyrrolate (ROBINUL) 1 MG tablet Take 2  tablets (2 mg total) by mouth every 8 (eight) hours. 60 tablet 1 12/26/2015 at Unknown time  . ondansetron (ZOFRAN) 8 MG tablet Take 1 tablet (8 mg total) by mouth every 6 (six) hours. 30 tablet 2 12/26/2015 at Unknown time  . promethazine (PHENERGAN) 25 MG tablet Take 1 tablet (25 mg total) by mouth every 6 (six) hours as needed for nausea or vomiting. 30 tablet 0 12/26/2015 at Unknown time  . pantoprazole (PROTONIX) 40 MG tablet Take 1 tablet (40 mg total) by mouth at bedtime. 30 tablet 1 Unknown at Unknown time  . Prenatal Multivit-Min-Fe-FA (PRENATAL VITAMINS) 0.8 MG tablet Take 1 tablet by mouth daily. (Patient not taking: Reported on 12/21/2015) 30 tablet 11 Not Taking at Unknown time  . prenatal vitamin w/FE, FA (PRENATAL 1 + 1) 27-1 MG TABS tablet Take 1 tablet by mouth daily at 12 noon.   unknown  . promethazine (PHENERGAN) 12.5 MG tablet Take 1 tablet (12.5 mg total) by mouth every 6 (six) hours as needed for nausea or vomiting. (Patient not taking: Reported on 12/21/2015) 30 tablet 0 not filled  . promethazine (PHENERGAN) 25 MG tablet Take 1 tablet (25 mg total) by mouth every 6 (six) hours as needed for nausea or vomiting. (Patient not taking: Reported on 12/21/2015) 30 tablet 0 not filled    Review of Systems  Constitutional: Negative  for fever and chills.  Gastrointestinal: Positive for nausea and vomiting. Negative for abdominal pain, diarrhea and constipation.  Genitourinary: Negative for dysuria, urgency and frequency.   Physical Exam   Blood pressure 118/81, pulse 119, temperature 98.4 F (36.9 C), temperature source Oral, resp. rate 16, height 5\' 8"  (1.727 m), weight 51.075 kg (112 lb 9.6 oz), last menstrual period 10/20/2015, SpO2 100 %.  Physical Exam  Nursing note and vitals reviewed. Constitutional: She is oriented to person, place, and time. She appears well-developed and well-nourished. No distress.  HENT:  Head: Normocephalic.  Cardiovascular: Normal rate.   Respiratory:  Effort normal.  GI: Soft. There is no tenderness. There is no rebound.  Neurological: She is alert and oriented to person, place, and time.  Skin: Skin is warm and dry.  Psychiatric: She has a normal mood and affect.    MAU Course  Procedures  MDM Patient has had 1L of D5LR with phenergan, and 1L of LR. She has not vomited since being here, and she is tolerating PO fluids. She has also gained weight since her last visit here.   Assessment and Plan   1. Hyperemesis affecting pregnancy, antepartum    DC home Comfort measures reviewed  1stTrimester precautions  RX: for phenergan with refills given  Return to MAU as needed FU with OB as planned  Follow-up Information    [facility]   Specialty:  Obstetrics and Gynecology   Why:  As scheduled   Contact information:   [address] [place] [id number] [phone]        Tawnya CrookHogan, Akyia Borelli Donovan 12/26/2015, 10:26 PM

## 2015-12-26 NOTE — MAU Note (Signed)
Pt c/o vomiting that started yesterday. States that she has vomited continuously since yesterday and cannot keep anything down. Denies pain or bleeding.

## 2015-12-27 DIAGNOSIS — O219 Vomiting of pregnancy, unspecified: Secondary | ICD-10-CM

## 2015-12-27 MED ORDER — PROMETHAZINE HCL 25 MG PO TABS: 12.5000 mg | ORAL_TABLET | Freq: Four times a day (QID) | ORAL | Status: DC | PRN

## 2015-12-27 MED ORDER — LACTATED RINGERS IV BOLUS (SEPSIS)
1000.0000 mL | Freq: Once | INTRAVENOUS | Status: AC
  Administered 2015-12-27: 1000 mL via INTRAVENOUS

## 2015-12-27 NOTE — Discharge Instructions (Signed)
Hyperemesis Gravidarum  Hyperemesis gravidarum is a severe form of nausea and vomiting that happens during pregnancy. Hyperemesis is worse than morning sickness. It may cause you to have nausea or vomiting all day for many days. It may keep you from eating and drinking enough food and liquids. Hyperemesis usually occurs during the first half (the first 20 weeks) of pregnancy. It often goes away once a woman is in her second half of pregnancy. However, sometimes hyperemesis continues through an entire pregnancy.   CAUSES   The cause of this condition is not completely known but is thought to be related to changes in the body's hormones when pregnant. It could be from the high level of the pregnancy hormone or an increase in estrogen in the body.   SIGNS AND SYMPTOMS    Severe nausea and vomiting.   Nausea that does not go away.   Vomiting that does not allow you to keep any food down.   Weight loss and body fluid loss (dehydration).   Having no desire to eat or not liking food you have previously enjoyed.  DIAGNOSIS   Your health care provider will do a physical exam and ask you about your symptoms. He or she may also order blood tests and urine tests to make sure something else is not causing the problem.   TREATMENT   You may only need medicine to control the problem. If medicines do not control the nausea and vomiting, you will be treated in the hospital to prevent dehydration, increased acid in the blood (acidosis), weight loss, and changes in the electrolytes in your body that may harm the unborn baby (fetus). You may need IV fluids.   HOME CARE INSTRUCTIONS    Only take over-the-counter or prescription medicines as directed by your health care provider.   Try eating a couple of dry crackers or toast in the morning before getting out of bed.   Avoid foods and smells that upset your stomach.   Avoid fatty and spicy foods.   Eat 5-6 small meals a day.   Do not drink when eating meals. Drink between  meals.   For snacks, eat high-protein foods, such as cheese.   Eat or suck on things that have ginger in them. Ginger helps nausea.   Avoid food preparation. The smell of food can spoil your appetite.   Avoid iron pills and iron in your multivitamins until after 3-4 months of being pregnant. However, consult with your health care provider before stopping any prescribed iron pills.  SEEK MEDICAL CARE IF:    Your abdominal pain increases.   You have a severe headache.   You have vision problems.   You are losing weight.  SEEK IMMEDIATE MEDICAL CARE IF:    You are unable to keep fluids down.   You vomit blood.   You have constant nausea and vomiting.   You have excessive weakness.   You have extreme thirst.   You have dizziness or fainting.   You have a fever or persistent symptoms for more than 2-3 days.   You have a fever and your symptoms suddenly get worse.  MAKE SURE YOU:    Understand these instructions.   Will watch your condition.   Will get help right away if you are not doing well or get worse.     This information is not intended to replace advice given to you by your health care provider. Make sure you discuss any questions you have with   your health care provider.     Document Released: 08/21/2005 Document Revised: 06/11/2013 Document Reviewed: 04/02/2013  Elsevier Interactive Patient Education 2016 [employer] Inc[employer]

## 2015-12-31 ENCOUNTER — Inpatient Hospital Stay (HOSPITAL_COMMUNITY)
Admission: AD | Admit: 2015-12-31 | Discharge: 2015-12-31 | Disposition: A | Discharge status: Home / Self Care | Payer: BLUE CROSS/BLUE SHIELD | Source: Ambulatory Visit | Attending: Obstetrics & Gynecology | Admitting: Obstetrics & Gynecology

## 2015-12-31 ENCOUNTER — Encounter: Payer: BLUE CROSS/BLUE SHIELD | Admitting: Family Medicine

## 2015-12-31 ENCOUNTER — Encounter (HOSPITAL_COMMUNITY): Payer: Self-pay | Admitting: *Deleted

## 2015-12-31 DIAGNOSIS — Z3A10 10 weeks gestation of pregnancy: Secondary | ICD-10-CM | POA: Insufficient documentation

## 2015-12-31 DIAGNOSIS — O211 Hyperemesis gravidarum with metabolic disturbance: Secondary | ICD-10-CM | POA: Diagnosis not present

## 2015-12-31 DIAGNOSIS — E860 Dehydration: Secondary | ICD-10-CM

## 2015-12-31 DIAGNOSIS — O219 Vomiting of pregnancy, unspecified: Secondary | ICD-10-CM | POA: Diagnosis not present

## 2015-12-31 DIAGNOSIS — O210 Mild hyperemesis gravidarum: Secondary | ICD-10-CM | POA: Diagnosis present

## 2015-12-31 LAB — URINALYSIS, ROUTINE W REFLEX MICROSCOPIC
GLUCOSE, UA: NEGATIVE mg/dL
HGB URINE DIPSTICK: NEGATIVE
KETONES UR: 40 mg/dL — AB
LEUKOCYTES UA: NEGATIVE
Nitrite: NEGATIVE
PH: 6 (ref 5.0–8.0)
Protein, ur: 100 mg/dL — AB
Specific Gravity, Urine: >1.03 — ABNORMAL HIGH (ref 1.005–1.030)

## 2015-12-31 LAB — CBC
HEMATOCRIT: 35 % — AB (ref 36.0–46.0)
HEMOGLOBIN: 11.8 g/dL — AB (ref 12.0–15.0)
MCH: 29.4 pg (ref 26.0–34.0)
MCHC: 33.7 g/dL (ref 30.0–36.0)
MCV: 87.3 fL (ref 78.0–100.0)
Platelets: 337 10*3/uL (ref 150–400)
RBC: 4.01 MIL/uL (ref 3.87–5.11)
RDW: 13.1 % (ref 11.5–15.5)
WBC: 7.3 10*3/uL (ref 4.0–10.5)

## 2015-12-31 LAB — COMPREHENSIVE METABOLIC PANEL
ALK PHOS: 43 U/L (ref 38–126)
ALT: 42 U/L (ref 14–54)
ANION GAP: 9 (ref 5–15)
AST: 23 U/L (ref 15–41)
Albumin: 3.7 g/dL (ref 3.5–5.0)
BILIRUBIN TOTAL: 0.5 mg/dL (ref 0.3–1.2)
BUN: 11 mg/dL (ref 6–20)
CALCIUM: 9.4 mg/dL (ref 8.9–10.3)
CO2: 23 mmol/L (ref 22–32)
CREATININE: 0.62 mg/dL (ref 0.44–1.00)
Chloride: 102 mmol/L (ref 101–111)
GFR calc non Af Amer: >60 mL/min (ref >60)
Glucose, Bld: 136 mg/dL — ABNORMAL HIGH (ref 65–99)
Potassium: 3.9 mmol/L (ref 3.5–5.1)
Sodium: 134 mmol/L — ABNORMAL LOW (ref 135–145)
Total Protein: 7.9 g/dL (ref 6.5–8.1)

## 2015-12-31 LAB — URINE MICROSCOPIC-ADD ON: RBC / HPF: NONE SEEN RBC/hpf (ref 0–5)

## 2015-12-31 MED ORDER — LACTATED RINGERS IV BOLUS (SEPSIS)
1000.0000 mL | Freq: Once | INTRAVENOUS | Status: AC
  Administered 2015-12-31: 1000 mL via INTRAVENOUS

## 2015-12-31 MED ORDER — METHYLPREDNISOLONE 4 MG PO TBPK: ORAL_TABLET | ORAL | Status: DC

## 2015-12-31 MED ORDER — METHYLPREDNISOLONE SODIUM SUCC 125 MG IJ SOLR
50.0000 mg | Freq: Once | INTRAMUSCULAR | Status: AC
Start: 2015-12-31 — End: 2015-12-31
  Administered 2015-12-31: 50 mg via INTRAVENOUS
  Filled 2015-12-31: qty 2

## 2015-12-31 MED ORDER — DEXTROSE IN LACTATED RINGERS 5 % IV SOLN
25.0000 mg | Freq: Once | INTRAVENOUS | Status: AC
  Administered 2015-12-31: 25 mg via INTRAVENOUS
  Filled 2015-12-31: qty 1

## 2015-12-31 MED ORDER — ONDANSETRON HCL 4 MG/2ML IJ SOLN
4.0000 mg | Freq: Once | INTRAMUSCULAR | Status: AC
  Administered 2015-12-31: 4 mg via INTRAVENOUS
  Filled 2015-12-31: qty 2

## 2015-12-31 MED ORDER — PROMETHAZINE HCL 25 MG RE SUPP: 25.0000 mg | Freq: Four times a day (QID) | RECTAL | Status: DC | PRN

## 2015-12-31 NOTE — MAU Note (Signed)
Urine in lab 

## 2015-12-31 NOTE — MAU Note (Signed)
Pt vomiting and unable to eat for two days, spitting and vomiting bile. Pt weight is 106 today.  Last took phenergan 2000 last night.

## 2015-12-31 NOTE — MAU Provider Note (Signed)
History   [id number]   Chief Complaint  Patient presents with  . Emesis During Pregnancy    HPI Sharon Barnett is a 25 y.o. female  G1P0000 at [redacted]w[redacted]d IUP here with report of nausea and vomiting of pregnancy.  Pt has been seen before for this same complaint.  Reports unable to hold anything down for the past two days.  Last meds for vomiting was at 2000.  Denies fever, body aches or chills.  No indication that it is something other than emesis of pregnancy.  Weight at last visit was 112.  6 lb diff in 5 days.    Patient's last menstrual period was 10/20/2015 (approximate).  OB History  Gravida Para Term Preterm AB SAB TAB Ectopic Multiple Living     # Outcome Date GA Lbr Len/2nd Weight Sex Delivery Anes PTL Lv  1 Current               Past Medical History  Diagnosis Date  . Boils   . ADHD (attention deficit hyperactivity disorder)   . Complication of anesthesia     Hypersensitive to anesthesia (wisdom teeth)  . Vaginal Pap smear, abnormal     Family History  Problem Relation Age of Onset  . Depression Mother   . Diabetes Mother   . Hypertension Mother   . Miscarriages / [place] Mother   . Asthma Sister   . Depression Sister   . Hearing loss Sister   . Birth defects Sister   . Hyperlipidemia Sister   . Hypertension Sister   . Miscarriages / Stillbirths Sister   . Asthma Maternal Grandmother   . Cancer Maternal Grandmother   . Diabetes Maternal Grandmother   . Hypertension Maternal Grandmother   . Stroke Maternal Grandmother   . Vision loss Maternal Grandmother   . Cancer Paternal Grandmother     Social History   Social History  . Marital Status: Single    Spouse Name: N/A  . Number of Children: N/A  . Years of Education: N/A   Social History Main Topics  . Smoking status: Never Smoker   . Smokeless tobacco: Never Used  . Alcohol Use: No  . Drug Use: No  . Sexual Activity: Yes    Birth Control/ Protection: Condom   Other  Topics Concern  . None   Social History Narrative    No Known Allergies  No current facility-administered medications on file prior to encounter.   Current Outpatient Prescriptions on File Prior to Encounter  Medication Sig Dispense Refill  . glycopyrrolate (ROBINUL) 1 MG tablet Take 2 tablets (2 mg total) by mouth every 8 (eight) hours. 60 tablet 1  . ondansetron (ZOFRAN) 8 MG tablet Take 1 tablet (8 mg total) by mouth every 6 (six) hours. 30 tablet 2  . promethazine (PHENERGAN) 25 MG tablet Take 1 tablet (25 mg total) by mouth every 6 (six) hours as needed for nausea or vomiting. 30 tablet 0  . pantoprazole (PROTONIX) 40 MG tablet Take 1 tablet (40 mg total) by mouth at bedtime. (Patient not taking: Reported on 12/31/2015) 30 tablet 1  . Prenatal Multivit-Min-Fe-FA (PRENATAL VITAMINS) 0.8 MG tablet Take 1 tablet by mouth daily. (Patient not taking: Reported on 12/31/2015) 30 tablet 11     Review of Systems  Constitutional: Positive for fatigue. Negative for fever.  HENT: Negative for sore throat.   Gastrointestinal: Positive for nausea and vomiting. Negative for diarrhea and  constipation.  Genitourinary: Negative for dysuria, hematuria, vaginal bleeding and pelvic pain.     Physical Exam   Filed Vitals:   12/31/15 1110 12/31/15 1120  BP:  114/74  Pulse:  103  Temp:  98.9 F (37.2 C)  TempSrc:  Oral  Resp:  16  Height: 5\' 8"  (1.727 m)   Weight: 106 lb (48.081 kg)     Physical Exam  Constitutional: She is oriented to person, place, and time. She appears well-developed and well-nourished.  Ill appearing; thin  HENT:  Head: Normocephalic.  Mouth/Throat: Mucous membranes are dry.  Neck: Normal range of motion. Neck supple.  Cardiovascular: Normal rate, regular rhythm and normal heart sounds.   Respiratory: Effort normal and breath sounds normal.  GI: Soft. There is no tenderness.  Genitourinary: No bleeding in the vagina.  Musculoskeletal: Normal range of motion.   Neurological: She is alert and oriented to person, place, and time. She has normal reflexes.  Skin: Skin is warm and dry. She is not diaphoretic.   Results for orders placed or performed during the hospital encounter of 12/31/15 (from the past 24 hour(s))  Urinalysis, Routine w reflex microscopic (not [facility])     Status: Abnormal   Collection Time: 12/31/15 11:05 AM  Result Value Ref Range   Color, Urine YELLOW YELLOW   APPearance HAZY (A) CLEAR   Specific Gravity, Urine >1.030 (H) 1.005 - 1.030   pH 6.0 5.0 - 8.0   Glucose, UA NEGATIVE NEGATIVE mg/dL   Hgb urine dipstick NEGATIVE NEGATIVE   Bilirubin Urine SMALL (A) NEGATIVE   Ketones, ur 40 (A) NEGATIVE mg/dL   Protein, ur 045100 (A) NEGATIVE mg/dL   Nitrite NEGATIVE NEGATIVE   Leukocytes, UA NEGATIVE NEGATIVE  Urine microscopic-add on     Status: Abnormal   Collection Time: 12/31/15 11:05 AM  Result Value Ref Range   Squamous Epithelial / LPF 0-5 (A) NONE SEEN   WBC, UA 0-5 0 - 5 WBC/hpf   RBC / HPF NONE SEEN 0 - 5 RBC/hpf   Bacteria, UA FEW (A) NONE SEEN   Urine-Other MUCOUS PRESENT   CBC     Status: Abnormal   Collection Time: 12/31/15 12:15 PM  Result Value Ref Range   WBC 7.3 4.0 - 10.5 K/uL   RBC 4.01 3.87 - 5.11 MIL/uL   Hemoglobin 11.8 (L) 12.0 - 15.0 g/dL   HCT 40.935.0 (L) 81.136.0 - 91.446.0 %   MCV 87.3 78.0 - 100.0 fL   MCH 29.4 26.0 - 34.0 pg   MCHC 33.7 30.0 - 36.0 g/dL   RDW 78.213.1 95.611.5 - 21.315.5 %   Platelets 337 150 - 400 K/uL  Comprehensive metabolic panel     Status: Abnormal   Collection Time: 12/31/15 12:42 PM  Result Value Ref Range   Sodium 134 (L) 135 - 145 mmol/L   Potassium 3.9 3.5 - 5.1 mmol/L   Chloride 102 101 - 111 mmol/L   CO2 23 22 - 32 mmol/L   Glucose, Bld 136 (H) 65 - 99 mg/dL   BUN 11 6 - 20 mg/dL   Creatinine, Ser 0.860.62 0.44 - 1.00 mg/dL   Calcium 9.4 8.9 - 57.810.3 mg/dL   Total Protein 7.9 6.5 - 8.1 g/dL   Albumin 3.7 3.5 - 5.0 g/dL   AST 23 15 - 41 U/L   ALT 42 14 - 54 U/L   Alkaline  Phosphatase 43 38 - 126 U/L   Total Bilirubin 0.5 0.3 - 1.2 mg/dL  GFR calc non Af Amer >60 >60 mL/min   GFR calc Af Amer >60 >60 mL/min   Anion gap 9 5 - 15   MAU Course  Procedures  1400 Report given to L. Leftwich-Kirby who assumes care of the patient  Talmage Nap 12/31/2015 1:26 PM    MDM: Reviewed lab results.  Consult Dr Macon Large.  Pt is not taking medications at home as prescribed. Plan for discharge with steroid taper per pharmacy consult and to take Phenergan and Zofran as prescribed.  Add Rx for Phenergan suppositories as pt is not using Phenergan tablets vaginally or rectally, although this has been discussed with her.  Discussed importance of taking medications as scheduled with pt and her female partner.  Return to MAU if unable to keep down food or fluids for 24 hours.  Pt stable at time of discharge.  Assessment and Plan  25 y.o. G1P0000 at [redacted]w[redacted]d IUP  1. Severe hyperemesis gravidarum   2. Mild dehydration     D/C home Steroid taper x 14 days per pharmacy consult Take Phenergan and Zofran as previously prescribed, add suppository Phenergan as needed F/U in clinic as scheduled Return to MAU as needed for emergencies  LEFTWICH-KIRBY, Caetano Oberhaus, CNM 6:55 PM

## 2015-12-31 NOTE — Discharge Instructions (Signed)
Pharmacy Consult:  MEDROL (METHYLPREDNISOLONE) TAPER  FOR HYPEREMESIS GRAVIDARUM PATIENTS  The following is a 14 day taper of methylprednisolone for hyperemesis. Doses on day 1 will be given IV. All doses starting on day 2 will be given PO. (If patient cannot tolerate oral medications, contact the pharmacy to change route to IV.)   Date  Day  Morning  Midday  Bedtime    1  --  --  48 mg    2  16 mg  16 mg  16 mg    3  16 mg  16 mg  16 mg    4  16 mg  8 mg  16 mg    5  16 mg  8 mg  8 mg    6  8 mg  8 mg  8 mg    7  8 mg  4 mg  8 mg    8  8 mg  4 mg  4 mg    9  8 mg  4 mg     10  8 mg  4 mg     11  8 mg      12  8 mg      13  4 mg      14  4 mg      Check fasting blood sugars daily while on the taper. Notify MD if fasting blood sugar>95.    Eating Plan for Hyperemesis Gravidarum Severe cases of hyperemesis gravidarum can lead to dehydration and malnutrition. The hyperemesis eating plan is one way to lessen the symptoms of nausea and vomiting. It is often used with prescribed medicines to control your symptoms.  WHAT CAN I DO TO RELIEVE MY SYMPTOMS? Listen to your body. Everyone is different and has different preferences. Find what works best for you. Some of the following things may help:  Eat and drink slowly.  Eat 5-6 small meals daily instead of 3 large meals.   Eat crackers before you get out of bed in the morning.   Starchy foods are usually well tolerated (such as cereal, toast, bread, potatoes, pasta, rice, and pretzels).   Ginger may help with nausea. Add  tsp ground ginger to hot tea or choose ginger tea.   Try drinking 100% fruit juice or an electrolyte drink.  Continue to take your prenatal vitamins as directed by your health care provider. If you are having trouble taking your prenatal vitamins, talk with your health care provider about different options.  Include at least 1 serving of protein with your meals and snacks (such as meats or poultry, beans, nuts,  eggs, or yogurt). Try eating a protein-rich snack before bed (such as cheese and crackers or a half [place] or peanut butter sandwich). WHAT THINGS SHOULD I AVOID TO REDUCE MY SYMPTOMS? The following things may help reduce your symptoms:  Avoid foods with strong smells. Try eating meals in well-ventilated areas that are free of odors.  Avoid drinking water or other beverages with meals. Try not to drink anything less than 30 minutes before and after meals.  Avoid drinking more than 1 cup of fluid at a time.  Avoid fried or high-fat foods, such as butter and cream sauces.  Avoid spicy foods.  Avoid skipping meals the best you can. Nausea can be more intense on an empty stomach. If you cannot tolerate food at that time, do not force it. Try sucking on ice chips or other frozen items and make up  the calories later.  Avoid lying down within 2 hours after eating.   This information is not intended to replace advice given to you by your health care provider. Make sure you discuss any questions you have with your health care provider.   Document Released: 06/18/2007 Document Revised: 08/26/2013 Document Reviewed: 06/25/2013 Elsevier Interactive Patient Education [employer]

## 2016-01-07 ENCOUNTER — Other Ambulatory Visit (HOSPITAL_COMMUNITY)
Admission: RE | Admit: 2016-01-07 | Discharge: 2016-01-07 | Disposition: A | Discharge status: Home / Self Care | Payer: BLUE CROSS/BLUE SHIELD | Source: Ambulatory Visit | Attending: Family Medicine | Admitting: Family Medicine

## 2016-01-07 ENCOUNTER — Ambulatory Visit (INDEPENDENT_AMBULATORY_CARE_PROVIDER_SITE_OTHER): Payer: BLUE CROSS/BLUE SHIELD | Admitting: Family Medicine

## 2016-01-07 VITALS — BP 116/80 | HR 122 | Wt 122.0 lb

## 2016-01-07 DIAGNOSIS — Z113 Encounter for screening for infections with a predominantly sexual mode of transmission: Secondary | ICD-10-CM | POA: Diagnosis present

## 2016-01-07 DIAGNOSIS — Z3401 Encounter for supervision of normal first pregnancy, first trimester: Secondary | ICD-10-CM

## 2016-01-07 MED ORDER — PANTOPRAZOLE SODIUM 40 MG PO TBEC: 40.0000 mg | DELAYED_RELEASE_TABLET | Freq: Every day | ORAL | Status: DC

## 2016-01-07 MED ORDER — PROMETHAZINE HCL 25 MG PO TABS: 25.0000 mg | ORAL_TABLET | Freq: Four times a day (QID) | ORAL | Status: DC | PRN

## 2016-01-07 MED ORDER — ONDANSETRON HCL 8 MG PO TABS: 8.0000 mg | ORAL_TABLET | Freq: Four times a day (QID) | ORAL | Status: DC

## 2016-01-07 NOTE — Patient Instructions (Signed)
First Trimester of Pregnancy The first trimester of pregnancy is from week 1 until the end of week 12 (months 1 through 3). A week after a sperm fertilizes an egg, the egg will implant on the wall of the uterus. This embryo will begin to develop into a baby. Genes from you and your partner are forming the baby. The female genes determine whether the baby is a boy or a girl. At 6-8 weeks, the eyes and face are formed, and the heartbeat can be seen on ultrasound. At the end of 12 weeks, all the baby's organs are formed.  Now that you are pregnant, you will want to do everything you can to have a healthy baby. Two of the most important things are to get good prenatal care and to follow your health care provider's instructions. Prenatal care is all the medical care you receive before the baby's birth. This care will help prevent, find, and treat any problems during the pregnancy and childbirth. BODY CHANGES Your body goes through many changes during pregnancy. The changes vary from woman to woman.   You may gain or lose a couple of pounds at first.  You may feel sick to your stomach (nauseous) and throw up (vomit). If the vomiting is uncontrollable, call your health care provider.  You may tire easily.  You may develop headaches that can be relieved by medicines approved by your health care provider.  You may urinate more often. Painful urination may mean you have a bladder infection.  You may develop heartburn as a result of your pregnancy.  You may develop constipation because certain hormones are causing the muscles that push waste through your intestines to slow down.  You may develop hemorrhoids or swollen, bulging veins (varicose veins).  Your breasts may begin to grow larger and become tender. Your nipples may stick out more, and the tissue that surrounds them (areola) may become darker.  Your gums may bleed and may be sensitive to brushing and flossing.  Dark spots or blotches (chloasma,  mask of pregnancy) may develop on your face. This will likely fade after the baby is born.  Your menstrual periods will stop.  You may have a loss of appetite.  You may develop cravings for certain kinds of food.  You may have changes in your emotions from day to day, such as being excited to be pregnant or being concerned that something may go wrong with the pregnancy and baby.  You may have more vivid and strange dreams.  You may have changes in your hair. These can include thickening of your hair, rapid growth, and changes in texture. Some women also have hair loss during or after pregnancy, or hair that feels dry or thin. Your hair will most likely return to normal after your baby is born. WHAT TO EXPECT AT YOUR PRENATAL VISITS During a routine prenatal visit:  You will be weighed to make sure you and the baby are growing normally.  Your blood pressure will be taken.  Your abdomen will be measured to track your baby's growth.  The fetal heartbeat will be listened to starting around week 10 or 12 of your pregnancy.  Test results from any previous visits will be discussed. Your health care provider may ask you:  How you are feeling.  If you are feeling the baby move.  If you have had any abnormal symptoms, such as leaking fluid, bleeding, severe headaches, or abdominal cramping.  If you are using any tobacco products,   including cigarettes, chewing tobacco, and electronic cigarettes.  If you have any questions. Other tests that may be performed during your first trimester include:  Blood tests to find your blood type and to check for the presence of any previous infections. They will also be used to check for low iron levels (anemia) and Rh antibodies. Later in the pregnancy, blood tests for diabetes will be done along with other tests if problems develop.  Urine tests to check for infections, diabetes, or protein in the urine.  An ultrasound to confirm the proper growth  and development of the baby.  An amniocentesis to check for possible genetic problems.  Fetal screens for spina bifida and Down syndrome.  You may need other tests to make sure you and the baby are doing well.  HIV (human immunodeficiency virus) testing. Routine prenatal testing includes screening for HIV, unless you choose not to have this test. HOME CARE INSTRUCTIONS  Medicines  Follow your health care provider's instructions regarding medicine use. Specific medicines may be either safe or unsafe to take during pregnancy.  Take your prenatal vitamins as directed.  If you develop constipation, try taking a stool softener if your health care provider approves. Diet  Eat regular, well-balanced meals. Choose a variety of foods, such as meat or vegetable-based protein, fish, milk and low-fat dairy products, vegetables, fruits, and whole grain breads and cereals. Your health care provider will help you determine the amount of weight gain that is right for you.  Avoid raw meat and uncooked cheese. These carry germs that can cause birth defects in the baby.  Eating four or five small meals rather than three large meals a day may help relieve nausea and vomiting. If you start to feel nauseous, eating a few soda crackers can be helpful. Drinking liquids between meals instead of during meals also seems to help nausea and vomiting.  If you develop constipation, eat more high-fiber foods, such as fresh vegetables or fruit and whole grains. Drink enough fluids to keep your urine clear or pale yellow. Activity and Exercise  Exercise only as directed by your health care provider. Exercising will help you:  Control your weight.  Stay in shape.  Be prepared for labor and delivery.  Experiencing pain or cramping in the lower abdomen or low back is a good sign that you should stop exercising. Check with your health care provider before continuing normal exercises.  Try to avoid standing for long  periods of time. Move your legs often if you must stand in one place for a long time.  Avoid heavy lifting.  Wear low-heeled shoes, and practice good posture.  You may continue to have sex unless your health care provider directs you otherwise. Relief of Pain or Discomfort  Wear a good support bra for breast tenderness.   Take warm sitz baths to soothe any pain or discomfort caused by hemorrhoids. Use hemorrhoid cream if your health care provider approves.   Rest with your legs elevated if you have leg cramps or low back pain.  If you develop varicose veins in your legs, wear support hose. Elevate your feet for 15 minutes, 3-4 times a day. Limit salt in your diet. Prenatal Care  Schedule your prenatal visits by the twelfth week of pregnancy. They are usually scheduled monthly at first, then more often in the last 2 months before delivery.  Write down your questions. Take them to your prenatal visits.  Keep all your prenatal visits as directed by your   health care provider. Safety  Wear your seat belt at all times when driving.  Make a list of emergency phone numbers, including numbers for family, friends, the hospital, and police and fire departments. General Tips  Ask your health care provider for a referral to a local prenatal education class. Begin classes no later than at the beginning of month 6 of your pregnancy.  Ask for help if you have counseling or nutritional needs during pregnancy. Your health care provider can offer advice or refer you to specialists for help with various needs.  Do not use hot tubs, steam rooms, or saunas.  Do not douche or use tampons or scented sanitary pads.  Do not cross your legs for long periods of time.  Avoid cat litter boxes and soil used by cats. These carry germs that can cause birth defects in the baby and possibly loss of the fetus by miscarriage or stillbirth.  Avoid all smoking, herbs, alcohol, and medicines not prescribed by  your health care provider. Chemicals in these affect the formation and growth of the baby.  Do not use any tobacco products, including cigarettes, chewing tobacco, and electronic cigarettes. If you need help quitting, ask your health care provider. You may receive counseling support and other resources to help you quit.  Schedule a dentist appointment. At home, brush your teeth with a soft toothbrush and be gentle when you floss. SEEK MEDICAL CARE IF:   You have dizziness.  You have mild pelvic cramps, pelvic pressure, or nagging pain in the abdominal area.  You have persistent nausea, vomiting, or diarrhea.  You have a bad smelling vaginal discharge.  You have pain with urination.  You notice increased swelling in your face, hands, legs, or ankles. SEEK IMMEDIATE MEDICAL CARE IF:   You have a fever.  You are leaking fluid from your vagina.  You have spotting or bleeding from your vagina.  You have severe abdominal cramping or pain.  You have rapid weight gain or loss.  You vomit blood or material that looks like coffee grounds.  You are exposed to German measles and have never had them.  You are exposed to fifth disease or chickenpox.  You develop a severe headache.  You have shortness of breath.  You have any kind of trauma, such as from a fall or a car accident.   This information is not intended to replace advice given to you by your health care provider. Make sure you discuss any questions you have with your health care provider.   Document Released: 08/15/2001 Document Revised: 09/11/2014 Document Reviewed: 07/01/2013 Elsevier Interactive Patient Education 2016 Elsevier Inc.  

## 2016-01-07 NOTE — Progress Notes (Signed)
Sharon Barnett is a 25 y.o. G1P0000 at 873w2d for routine follow up.  She reports nausea, no bleeding, no contractions, no cramping, no leaking and vomiting. She has been having significant problems w/ hyperemesis gravidarum. She is currently on a steroid taper and scheduled Protonix, Phenergan, and Zofran ([facility] There was initial concern for her weight loss due to this hyperemesis gravidarum as she had lost approximately 20 pounds from her prepregnancy weight. Today her weight is up and she is feeling better. Continues to have some nausea but minimal/no vomiting.  General -- oriented x3, pleasant and cooperative. Integument -- intact. No rash, erythema, or ecchymoses.  Chest -- good expansion. Lungs clear to auscultation. Cardiac -- RRR. No murmurs noted.  Abdomen -- soft, nontender. No masses palpable. Bowel sounds present. Female genitalia: normal external genitalia, vulva, vagina, cervix, uterus and adnexa  A/P: Pregnancy at 8873w2d. Hyperemesis gravidarum which is currently under significantly better control. Doing well.   Pregnancy issues include hyperemesis gravidarum. And treated chlamydia infection.  Test for cure of patient's chlamydia infection obtained today. Will follow-up.  Anatomy ultrasound ordered to be scheduled at 18-19 weeks. Pt  is interested in genetic screening. Bleeding and pain precautions reviewed. Follow up 4 weeks.

## 2016-01-10 LAB — CERVICOVAGINAL ANCILLARY ONLY
Chlamydia: NEGATIVE
NEISSERIA GONORRHEA: NEGATIVE

## 2016-01-12 ENCOUNTER — Inpatient Hospital Stay (HOSPITAL_COMMUNITY): Payer: Medicaid Other

## 2016-01-12 ENCOUNTER — Inpatient Hospital Stay (HOSPITAL_COMMUNITY)
Admission: AD | Admit: 2016-01-12 | Discharge: 2016-01-12 | Disposition: A | Discharge status: Home / Self Care | Payer: Medicaid Other | Source: Ambulatory Visit | Attending: Obstetrics and Gynecology | Admitting: Obstetrics and Gynecology

## 2016-01-12 ENCOUNTER — Encounter (HOSPITAL_COMMUNITY): Payer: Self-pay | Admitting: *Deleted

## 2016-01-12 DIAGNOSIS — Z3A12 12 weeks gestation of pregnancy: Secondary | ICD-10-CM | POA: Diagnosis not present

## 2016-01-12 DIAGNOSIS — R748 Abnormal levels of other serum enzymes: Secondary | ICD-10-CM | POA: Insufficient documentation

## 2016-01-12 DIAGNOSIS — K117 Disturbances of salivary secretion: Secondary | ICD-10-CM | POA: Diagnosis not present

## 2016-01-12 DIAGNOSIS — O99611 Diseases of the digestive system complicating pregnancy, first trimester: Secondary | ICD-10-CM | POA: Diagnosis not present

## 2016-01-12 DIAGNOSIS — K219 Gastro-esophageal reflux disease without esophagitis: Secondary | ICD-10-CM | POA: Insufficient documentation

## 2016-01-12 DIAGNOSIS — IMO0002 Reserved for concepts with insufficient information to code with codable children: Secondary | ICD-10-CM

## 2016-01-12 DIAGNOSIS — F909 Attention-deficit hyperactivity disorder, unspecified type: Secondary | ICD-10-CM | POA: Diagnosis not present

## 2016-01-12 DIAGNOSIS — O210 Mild hyperemesis gravidarum: Secondary | ICD-10-CM | POA: Diagnosis present

## 2016-01-12 DIAGNOSIS — Z79899 Other long term (current) drug therapy: Secondary | ICD-10-CM | POA: Diagnosis not present

## 2016-01-12 DIAGNOSIS — Z3491 Encounter for supervision of normal pregnancy, unspecified, first trimester: Secondary | ICD-10-CM

## 2016-01-12 LAB — CBC
HCT: 37.7 % (ref 36.0–46.0)
HEMOGLOBIN: 12.6 g/dL (ref 12.0–15.0)
MCH: 30.9 pg (ref 26.0–34.0)
MCHC: 33.4 g/dL (ref 30.0–36.0)
MCV: 92.4 fL (ref 78.0–100.0)
PLATELETS: 307 10*3/uL (ref 150–400)
RBC: 4.08 MIL/uL (ref 3.87–5.11)
RDW: 15.2 % (ref 11.5–15.5)
WBC: 14.3 10*3/uL — ABNORMAL HIGH (ref 4.0–10.5)

## 2016-01-12 LAB — URINALYSIS, ROUTINE W REFLEX MICROSCOPIC
BILIRUBIN URINE: NEGATIVE
GLUCOSE, UA: NEGATIVE mg/dL
Hgb urine dipstick: NEGATIVE
KETONES UR: NEGATIVE mg/dL
Leukocytes, UA: NEGATIVE
Nitrite: NEGATIVE
PH: 6 (ref 5.0–8.0)
Protein, ur: NEGATIVE mg/dL
Specific Gravity, Urine: >1.03 — ABNORMAL HIGH (ref 1.005–1.030)

## 2016-01-12 LAB — COMPREHENSIVE METABOLIC PANEL
ALK PHOS: 45 U/L (ref 38–126)
ALT: 262 U/L — ABNORMAL HIGH (ref 14–54)
ANION GAP: 9 (ref 5–15)
AST: 168 U/L — ABNORMAL HIGH (ref 15–41)
Albumin: 4 g/dL (ref 3.5–5.0)
BUN: 13 mg/dL (ref 6–20)
CALCIUM: 10.2 mg/dL (ref 8.9–10.3)
CO2: 28 mmol/L (ref 22–32)
Chloride: 97 mmol/L — ABNORMAL LOW (ref 101–111)
Creatinine, Ser: 0.51 mg/dL (ref 0.44–1.00)
Glucose, Bld: 83 mg/dL (ref 65–99)
Potassium: 4.7 mmol/L (ref 3.5–5.1)
SODIUM: 134 mmol/L — AB (ref 135–145)
Total Bilirubin: 0.6 mg/dL (ref 0.3–1.2)
Total Protein: 8.2 g/dL — ABNORMAL HIGH (ref 6.5–8.1)

## 2016-01-12 LAB — LIPASE, BLOOD: LIPASE: 19 U/L (ref 11–51)

## 2016-01-12 LAB — AMYLASE: AMYLASE: 162 U/L — AB (ref 28–100)

## 2016-01-12 MED ORDER — RANITIDINE HCL 150 MG PO TABS: 150.0000 mg | ORAL_TABLET | Freq: Two times a day (BID) | ORAL | Status: DC

## 2016-01-12 MED ORDER — GLYCOPYRROLATE 1 MG PO TABS: 1.0000 mg | ORAL_TABLET | Freq: Three times a day (TID) | ORAL | Status: DC

## 2016-01-12 MED ORDER — ONDANSETRON 8 MG PO TBDP: 8.0000 mg | ORAL_TABLET | Freq: Three times a day (TID) | ORAL | Status: DC | PRN

## 2016-01-12 MED ORDER — PROMETHAZINE HCL 25 MG/ML IJ SOLN
25.0000 mg | Freq: Once | INTRAVENOUS | Status: AC
  Administered 2016-01-12: 25 mg via INTRAVENOUS
  Filled 2016-01-12: qty 1

## 2016-01-12 MED ORDER — GLYCOPYRROLATE 0.2 MG/ML IJ SOLN
0.1000 mg | Freq: Once | INTRAMUSCULAR | Status: AC
Start: 2016-01-12 — End: 2016-01-12
  Administered 2016-01-12: 0.1 mg via INTRAVENOUS
  Filled 2016-01-12: qty 0.5

## 2016-01-12 MED ORDER — PROMETHAZINE HCL 25 MG PO TABS: 25.0000 mg | ORAL_TABLET | Freq: Four times a day (QID) | ORAL | Status: DC | PRN

## 2016-01-12 MED ORDER — FAMOTIDINE IN NACL 20-0.9 MG/50ML-% IV SOLN
20.0000 mg | Freq: Once | INTRAVENOUS | Status: AC
  Administered 2016-01-12: 20 mg via INTRAVENOUS
  Filled 2016-01-12: qty 50

## 2016-01-12 MED ORDER — SODIUM CHLORIDE 0.9 % IV SOLN
8.0000 mg | Freq: Once | INTRAVENOUS | Status: AC
  Administered 2016-01-12: 8 mg via INTRAVENOUS
  Filled 2016-01-12: qty 4

## 2016-01-12 MED FILL — PROMETHAZINE 25 MG TABLET: 25 | 7 days supply | Qty: 30 | Fill #0

## 2016-01-12 MED FILL — GLYCOPYRROLATE 1 MG TABLET: 1 | 10 days supply | Qty: 30 | Fill #0

## 2016-01-12 MED FILL — raNITIdine HCL 150 MG TABS: 150 | 30 days supply | Qty: 60 | Fill #0

## 2016-01-12 MED FILL — ONDANSETRON ODT 8 MG TABLET: 8 | 10 days supply | Qty: 30 | Fill #0

## 2016-01-12 NOTE — MAU Note (Signed)
Pt states she has been vomiting since yesterday.  Out of Phenergan and unable to pay for refill.  Unable to keep anything down. Not even ice chips.

## 2016-01-12 NOTE — MAU Provider Note (Signed)
History     CSN: [id number]  Arrival date and time: 01/12/16 4098   First Provider Initiated Contact with Patient 01/12/16 1010        Chief Complaint  Patient presents with  . Emesis During Pregnancy   HPI Sharon Barnett is a 25 y.o. G1P0000 at [redacted]w[redacted]d who presents with n/v. Has history of hyperemesis with this pregnancy. States symptoms were well controlled until she ran out of her medications a few days ago. Has since lost her insurance coverage and couldn't afford to get them filled as she was told by the pharmacy it would cost more than $200.  Reports ptyalism, heartburn, and some epigastric pain. Denies lower abdominal pain, vaginal bleeding, diarrhea, or constipation.   OB History    Gravida Para Term Preterm AB TAB SAB Ectopic Multiple Living        Past Medical History  Diagnosis Date  . Boils   . ADHD (attention deficit hyperactivity disorder)   . Complication of anesthesia     Hypersensitive to anesthesia (wisdom teeth)  . Vaginal Pap smear, abnormal     Past Surgical History  Procedure Laterality Date  . Wisdom tooth extraction      Family History  Problem Relation Age of Onset  . Depression Mother   . Diabetes Mother   . Hypertension Mother   . Miscarriages / [place] Mother   . Asthma Sister   . Depression Sister   . Hearing loss Sister   . Birth defects Sister   . Hyperlipidemia Sister   . Hypertension Sister   . Miscarriages / Stillbirths Sister   . Asthma Maternal Grandmother   . Cancer Maternal Grandmother   . Diabetes Maternal Grandmother   . Hypertension Maternal Grandmother   . Stroke Maternal Grandmother   . Vision loss Maternal Grandmother   . Cancer Paternal Grandmother     Social History  Substance Use Topics  . Smoking status: Never Smoker   . Smokeless tobacco: Never Used  . Alcohol Use: No    Allergies: No Known Allergies  Prescriptions prior to admission  Medication Sig Dispense Refill Last Dose   . glycopyrrolate (ROBINUL) 1 MG tablet Take 2 tablets (2 mg total) by mouth every 8 (eight) hours. 60 tablet 1 12/29/2015  . methylPREDNISolone (MEDROL DOSEPAK) 4 MG TBPK tablet Per pharmacy taper--see attached. 70 tablet 0   . ondansetron (ZOFRAN) 8 MG tablet Take 1 tablet (8 mg total) by mouth every 6 (six) hours. 30 tablet 2   . pantoprazole (PROTONIX) 40 MG tablet Take 1 tablet (40 mg total) by mouth at bedtime. 30 tablet 1   . promethazine (PHENERGAN) 25 MG suppository Place 1 suppository (25 mg total) rectally every 6 (six) hours as needed for nausea or vomiting. 12 each 2   . promethazine (PHENERGAN) 25 MG tablet Take 1 tablet (25 mg total) by mouth every 6 (six) hours as needed for nausea or vomiting. 30 tablet 0     Review of Systems  Constitutional: Negative.   Gastrointestinal: Positive for heartburn, nausea, vomiting and abdominal pain. Negative for diarrhea and constipation.  Genitourinary: Negative.   Neurological: Negative for headaches.   Physical Exam   Blood pressure 124/88, pulse 113, temperature 98.3 F (36.8 C), temperature source Oral, resp. rate 16, height 5' 8.75" (1.746 m), weight 114 lb 12.8 oz (52.073 kg), last menstrual period 10/20/2015, SpO2 98 %.  Physical Exam  Nursing note and  vitals reviewed. Constitutional: She is oriented to person, place, and time. She appears well-developed and well-nourished. No distress.  HENT:  Head: Normocephalic and atraumatic.  Eyes: Conjunctivae are normal. Right eye exhibits no discharge. Left eye exhibits no discharge. No scleral icterus.  Neck: Normal range of motion.  Cardiovascular: Normal rate and normal heart sounds.   No murmur heard. Respiratory: Effort normal and breath sounds normal. No respiratory distress. She has no wheezes.  GI: Soft. Bowel sounds are normal. She exhibits no distension. There is no tenderness. There is no rebound, no guarding and negative Murphy's sign.  Neurological: She is alert and oriented  to person, place, and time.  Skin: Skin is warm and dry. She is not diaphoretic.  Psychiatric: She has a normal mood and affect. Her behavior is normal. Judgment and thought content normal.    MAU Course  Procedures Results for orders placed or performed during the hospital encounter of 01/12/16 (from the past 24 hour(s))  Urinalysis, Routine w reflex microscopic (not at [facility])     Status: Abnormal   Collection Time: 01/12/16  8:55 AM  Result Value Ref Range   Color, Urine YELLOW YELLOW   APPearance CLEAR CLEAR   Specific Gravity, Urine >1.030 (H) 1.005 - 1.030   pH 6.0 5.0 - 8.0   Glucose, UA NEGATIVE NEGATIVE mg/dL   Hgb urine dipstick NEGATIVE NEGATIVE   Bilirubin Urine NEGATIVE NEGATIVE   Ketones, ur NEGATIVE NEGATIVE mg/dL   Protein, ur NEGATIVE NEGATIVE mg/dL   Nitrite NEGATIVE NEGATIVE   Leukocytes, UA NEGATIVE NEGATIVE  CBC     Status: Abnormal   Collection Time: 01/12/16  9:45 AM  Result Value Ref Range   WBC 14.3 (H) 4.0 - 10.5 K/uL   RBC 4.08 3.87 - 5.11 MIL/uL   Hemoglobin 12.6 12.0 - 15.0 g/dL   HCT 95.637.7 21.336.0 - 08.646.0 %   MCV 92.4 78.0 - 100.0 fL   MCH 30.9 26.0 - 34.0 pg   MCHC 33.4 30.0 - 36.0 g/dL   RDW 57.815.2 46.911.5 - 62.915.5 %   Platelets 307 150 - 400 K/uL  Comprehensive metabolic panel     Status: Abnormal   Collection Time: 01/12/16  9:45 AM  Result Value Ref Range   Sodium 134 (L) 135 - 145 mmol/L   Potassium 4.7 3.5 - 5.1 mmol/L   Chloride 97 (L) 101 - 111 mmol/L   CO2 28 22 - 32 mmol/L   Glucose, Bld 83 65 - 99 mg/dL   BUN 13 6 - 20 mg/dL   Creatinine, Ser 5.280.51 0.44 - 1.00 mg/dL   Calcium 41.310.2 8.9 - 24.410.3 mg/dL   Total Protein 8.2 (H) 6.5 - 8.1 g/dL   Albumin 4.0 3.5 - 5.0 g/dL   AST 010168 (H) 15 - 41 U/L   ALT 262 (H) 14 - 54 U/L   Alkaline Phosphatase 45 38 - 126 U/L   Total Bilirubin 0.6 0.3 - 1.2 mg/dL   GFR calc non Af Amer >60 >60 mL/min   GFR calc Af Amer >60 >60 mL/min   Anion gap 9 5 - 15  Amylase     Status: Abnormal   Collection Time:  01/12/16  9:45 AM  Result Value Ref Range   Amylase 162 (H) 28 - 100 U/L  Lipase, blood     Status: None   Collection Time: 01/12/16  9:45 AM  Result Value Ref Range   Lipase 19 11 - 51 U/L   [place] Abdomen Complete  01/12/2016  CLINICAL DATA:  25 year old female with vomiting, abnormal liver enzymes and leukocytosis. Currently pregnant. Initial encounter. EXAM: ABDOMEN ULTRASOUND COMPLETE COMPARISON:  Ob ultrasound today reported separately. FINDINGS: Gallbladder: No gallstones or wall thickening visualized. No sonographic Murphy sign noted by sonographer. Common bile duct: Diameter: 4 mm, normal Liver: No focal lesion identified. Within normal limits in parenchymal echogenicity. IVC: No abnormality visualized. Pancreas: Visualized portion unremarkable. Spleen: Size and appearance within normal limits. Right Kidney: Length: 11.7 cm. Echogenicity within normal limits. No mass. Borderline to mild right hydronephrosis (image 23). Left Kidney: Length: 11.5 cm. Echogenicity within normal limits. No mass or hydronephrosis visualized. Abdominal aorta: No aneurysm visualized. Other findings: None. IMPRESSION: 1. Borderline to mild right hydronephrosis. In the absence of hematuria maternal hydronephrosis of pregnancy would be favored. 2. Normal sonographic appearance of the liver, gallbladder, and biliary tree. Electronically Signed   By: Odessa Fleming M.D.   On: 01/12/2016 12:03   US Ob Comp Less 14 Wks  01/12/2016  CLINICAL DATA:  Pregnant, negative fetal heart tones EXAM: OBSTETRIC <14 WK ULTRASOUND TECHNIQUE: Transabdominal ultrasound was performed for evaluation of the gestation as well as the maternal uterus and adnexal regions. COMPARISON:  12/01/2015 FINDINGS: Intrauterine gestational sac: Single Yolk sac:  Present Embryo:  Present Cardiac Activity: Present Heart Rate: 153 bpm CRL:   66.3  mm   12 w 6 d                  [place] EDC: 07/20/2016 Subchorionic hemorrhage:  None visualized. Maternal uterus/adnexae:  Bilateral ovaries are within normal limits, noting a left corpus luteal cyst. No pelvic fluid. IMPRESSION: Single live intrauterine gestation with estimated gestational age [redacted] weeks 6 days by crown-rump length. Fetal heart rate 153 beats per minute. Electronically Signed   By: Charline Bills M.D.   On: 01/12/2016 11:56    MDM Unable to doppler FHT by RN or myself --- Ob ultrasound shows IUP with cardiac activity Phenergan 25 mg in bag of D5LR Pepcid 20 mg IV Robinul 0.1 mg IV LFTs & amylase elevated ----abdominal u/s ordered & hepatitis panel added on to labs. Negative murphy's sign Abdominal ultrasound normal Pt reports continue nausea, although she is no longer vomiting -- zofran 8 mg IV & 2nd bag of fluids given (LR) Pt states ready to go home Assessment and Plan  A: 1. Hyperemesis affecting pregnancy, antepartum   2. Fetal heart tones not heard   3. [redacted] weeks gestation of pregnancy   4. Elevated liver enzymes   5. Normal IUP (intrauterine pregnancy) on prenatal ultrasound, first trimester   6. Ptyalism   7. Gastroesophageal reflux disease without esophagitis     P: Discharge home Refilled rx & sent to [facility] outpatient pharmacy d/t lower cost Rx promethazine, zantac, robinul, zofran Hep panel pending Hyperemesis diet info given Discussed reasons to return Keep f/u with ob  Judeth Horn 01/12/2016, 9:40 AM

## 2016-01-12 NOTE — Discharge Instructions (Signed)
Eating Plan for Hyperemesis Gravidarum °Severe cases of hyperemesis gravidarum can lead to dehydration and malnutrition. The hyperemesis eating plan is one way to lessen the symptoms of nausea and vomiting. It is often used with prescribed medicines to control your symptoms.  °WHAT CAN I DO TO RELIEVE MY SYMPTOMS? °Listen to your body. Everyone is different and has different preferences. Find what works best for you. Some of the following things may help: °· Eat and drink slowly. °· Eat 5-6 small meals daily instead of 3 large meals.   °· Eat crackers before you get out of bed in the morning.   °· Starchy foods are usually well tolerated (such as cereal, toast, bread, potatoes, pasta, rice, and pretzels).   °· Ginger may help with nausea. Add ¼ tsp ground ginger to hot tea or choose ginger tea.   °· Try drinking 100% fruit juice or an electrolyte drink. °· Continue to take your prenatal vitamins as directed by your health care provider. If you are having trouble taking your prenatal vitamins, talk with your health care provider about different options. °· Include at least 1 serving of protein with your meals and snacks (such as meats or poultry, beans, nuts, eggs, or yogurt). Try eating a protein-rich snack before bed (such as cheese and crackers or a half turkey or peanut butter sandwich). °WHAT THINGS SHOULD I AVOID TO REDUCE MY SYMPTOMS? °The following things may help reduce your symptoms: °· Avoid foods with strong smells. Try eating meals in well-ventilated areas that are free of odors. °· Avoid drinking water or other beverages with meals. Try not to drink anything less than 30 minutes before and after meals. °· Avoid drinking more than 1 cup of fluid at a time. °· Avoid fried or high-fat foods, such as butter and cream sauces. °· Avoid spicy foods. °· Avoid skipping meals the best you can. Nausea can be more intense on an empty stomach. If you cannot tolerate food at that time, do not force it. Try sucking on  ice chips or other frozen items and make up the calories later. °· Avoid lying down within 2 hours after eating. °  °This information is not intended to replace advice given to you by your health care provider. Make sure you discuss any questions you have with your health care provider. °  °Document Released: 06/18/2007 Document Revised: 08/26/2013 Document Reviewed: 06/25/2013 °Elsevier Interactive Patient Education ©2016 [employer] ° °

## 2016-01-13 LAB — HEPATITIS PANEL, ACUTE
HCV Ab: <0.1 s/co ratio (ref 0.0–0.9)
HEP A IGM: NEGATIVE
HEP B C IGM: NEGATIVE
Hepatitis B Surface Ag: NEGATIVE

## 2016-01-14 ENCOUNTER — Inpatient Hospital Stay (HOSPITAL_COMMUNITY)
Admission: AD | Admit: 2016-01-14 | Discharge: 2016-01-14 | Disposition: A | Discharge status: Home / Self Care | Payer: Medicaid Other | Source: Ambulatory Visit | Attending: Obstetrics & Gynecology | Admitting: Obstetrics & Gynecology

## 2016-01-14 DIAGNOSIS — Z3401 Encounter for supervision of normal first pregnancy, first trimester: Secondary | ICD-10-CM

## 2016-01-14 DIAGNOSIS — Z3A12 12 weeks gestation of pregnancy: Secondary | ICD-10-CM

## 2016-01-14 DIAGNOSIS — O211 Hyperemesis gravidarum with metabolic disturbance: Secondary | ICD-10-CM | POA: Diagnosis not present

## 2016-01-14 DIAGNOSIS — L259 Unspecified contact dermatitis, unspecified cause: Secondary | ICD-10-CM

## 2016-01-14 LAB — URINALYSIS, ROUTINE W REFLEX MICROSCOPIC
Bilirubin Urine: NEGATIVE
Glucose, UA: NEGATIVE mg/dL
Hgb urine dipstick: NEGATIVE
KETONES UR: 15 mg/dL — AB
LEUKOCYTES UA: NEGATIVE
NITRITE: NEGATIVE
PH: 6 (ref 5.0–8.0)
Protein, ur: NEGATIVE mg/dL
Specific Gravity, Urine: 1.025 (ref 1.005–1.030)

## 2016-01-14 LAB — COMPREHENSIVE METABOLIC PANEL
ALBUMIN: 3.7 g/dL (ref 3.5–5.0)
ALK PHOS: 42 U/L (ref 38–126)
ALT: 136 U/L — AB (ref 14–54)
AST: 45 U/L — AB (ref 15–41)
Anion gap: 12 (ref 5–15)
BUN: 11 mg/dL (ref 6–20)
CO2: 23 mmol/L (ref 22–32)
CREATININE: 0.45 mg/dL (ref 0.44–1.00)
Calcium: 9.4 mg/dL (ref 8.9–10.3)
Chloride: 98 mmol/L — ABNORMAL LOW (ref 101–111)
GFR calc Af Amer: >60 mL/min (ref >60)
GFR calc non Af Amer: >60 mL/min (ref >60)
GLUCOSE: 74 mg/dL (ref 65–99)
Potassium: 3.6 mmol/L (ref 3.5–5.1)
SODIUM: 133 mmol/L — AB (ref 135–145)
Total Bilirubin: 0.9 mg/dL (ref 0.3–1.2)
Total Protein: 7.4 g/dL (ref 6.5–8.1)

## 2016-01-14 LAB — CBC
HCT: 33.3 % — ABNORMAL LOW (ref 36.0–46.0)
HEMOGLOBIN: 11.2 g/dL — AB (ref 12.0–15.0)
MCH: 30.5 pg (ref 26.0–34.0)
MCHC: 33.6 g/dL (ref 30.0–36.0)
MCV: 90.7 fL (ref 78.0–100.0)
Platelets: 263 10*3/uL (ref 150–400)
RBC: 3.67 MIL/uL — AB (ref 3.87–5.11)
RDW: 14.8 % (ref 11.5–15.5)
WBC: 11.4 10*3/uL — AB (ref 4.0–10.5)

## 2016-01-14 LAB — AMYLASE: Amylase: 149 U/L — ABNORMAL HIGH (ref 28–100)

## 2016-01-14 LAB — LIPASE, BLOOD: Lipase: 19 U/L (ref 11–51)

## 2016-01-14 MED ORDER — HYDROCORTISONE 1 % EX CREA: TOPICAL_CREAM | CUTANEOUS | Status: DC

## 2016-01-14 MED ORDER — PROMETHAZINE HCL 25 MG RE SUPP
25.0000 mg | Freq: Once | RECTAL | Status: AC
  Administered 2016-01-14: 25 mg via RECTAL
  Filled 2016-01-14: qty 1

## 2016-01-14 NOTE — MAU Provider Note (Signed)
Chief Complaint: Emesis   None     SUBJECTIVE HPI: Sharon Barnett is a 25 y.o. G1P0000 at [redacted]w[redacted]d by LMP who presents to maternity admissions reporting nausea/vomiting and inability to keep down food fluids or nausea medication for >24 hours.  She reports vomiting 10+ times in 24 hours. She has tried to take Zofran and Robinul tablets today but has vomited. She has not taken any Phenergan, PO or suppositories.  Her nausea improves for a few days, then worsens again.   She denies abdominal pain, vaginal bleeding, vaginal itching/burning, urinary symptoms, h/a, dizziness, n/v, or fever/chills.     HPI  Past Medical History  Diagnosis Date  . Boils   . ADHD (attention deficit hyperactivity disorder)   . Complication of anesthesia     Hypersensitive to anesthesia (wisdom teeth)  . Vaginal Pap smear, abnormal    Past Surgical History  Procedure Laterality Date  . Wisdom tooth extraction     Social History   Social History  . Marital Status: Single    Spouse Name: N/A  . Number of Children: N/A  . Years of Education: N/A   Occupational History  . Not on file.   Social History Main Topics  . Smoking status: Never Smoker   . Smokeless tobacco: Never Used  . Alcohol Use: No  . Drug Use: No  . Sexual Activity: Yes    Birth Control/ Protection: Condom   Other Topics Concern  . Not on file   Social History Narrative   No current facility-administered medications on file prior to encounter.   Current Outpatient Prescriptions on File Prior to Encounter  Medication Sig Dispense Refill  . glycopyrrolate (ROBINUL) 1 MG tablet Take 1 tablet (1 mg total) by mouth 3 (three) times daily. 30 tablet 0  . ondansetron (ZOFRAN ODT) 8 MG disintegrating tablet Take 1 tablet (8 mg total) by mouth every 8 (eight) hours as needed for nausea or vomiting. 30 tablet 0  . promethazine (PHENERGAN) 25 MG tablet Take 1 tablet (25 mg total) by mouth every 6 (six) hours as needed for nausea or  vomiting. 30 tablet 0  . ranitidine (ZANTAC) 150 MG tablet Take 1 tablet (150 mg total) by mouth 2 (two) times daily. 60 tablet 0  . promethazine (PHENERGAN) 25 MG suppository Place 1 suppository (25 mg total) rectally every 6 (six) hours as needed for nausea or vomiting. (Patient not taking: Reported on 01/14/2016) 12 each 2   No Known Allergies  ROS:  Review of Systems  Constitutional: Positive for appetite change and fatigue. Negative for fever and chills.  Respiratory: Negative for shortness of breath.   Cardiovascular: Negative for chest pain.  Gastrointestinal: Positive for nausea and vomiting.  Genitourinary: Negative for dysuria, flank pain, vaginal bleeding, vaginal discharge, difficulty urinating, vaginal pain and pelvic pain.  Neurological: Negative for dizziness and headaches.  Psychiatric/Behavioral: Negative.      I have reviewed patient's Past Medical Hx, Surgical Hx, Family Hx, Social Hx, medications and allergies.   Physical Exam   Patient Vitals for the past 24 hrs:  BP Temp Temp src Pulse Resp Weight  01/14/16 1552 125/85 mmHg 98.4 F (36.9 C) Oral 101 16 120 lb (54.432 kg)   Documented 13 lb weight gain since MAU visit on 12/31/15 when pt weight was 106.   Constitutional: Well-developed, well-nourished female in no acute distress.  Cardiovascular: normal rate Respiratory: normal effort GI: Abd soft, non-tender. Pos BS x 4 MS: Extremities nontender, no edema,  normal ROM Neurologic: Alert and oriented x 4.  GU: Neg CVAT.  FHT present, audible by doppler  LAB RESULTS Results for orders placed or performed during the hospital encounter of 01/14/16 (from the past 24 hour(s))  Urinalysis, Routine w reflex microscopic (not at [facility])     Status: Abnormal   Collection Time: 01/14/16  3:55 PM  Result Value Ref Range   Color, Urine YELLOW YELLOW   APPearance CLEAR CLEAR   Specific Gravity, Urine 1.025 1.005 - 1.030   pH 6.0 5.0 - 8.0   Glucose, UA NEGATIVE  NEGATIVE mg/dL   Hgb urine dipstick NEGATIVE NEGATIVE   Bilirubin Urine NEGATIVE NEGATIVE   Ketones, ur 15 (A) NEGATIVE mg/dL   Protein, ur NEGATIVE NEGATIVE mg/dL   Nitrite NEGATIVE NEGATIVE   Leukocytes, UA NEGATIVE NEGATIVE  CBC     Status: Abnormal   Collection Time: 01/14/16  4:42 PM  Result Value Ref Range   WBC 11.4 (H) 4.0 - 10.5 K/uL   RBC 3.67 (L) 3.87 - 5.11 MIL/uL   Hemoglobin 11.2 (L) 12.0 - 15.0 g/dL   HCT 16.1 (L) 09.6 - 04.5 %   MCV 90.7 78.0 - 100.0 fL   MCH 30.5 26.0 - 34.0 pg   MCHC 33.6 30.0 - 36.0 g/dL   RDW 40.9 81.1 - 91.4 %   Platelets 263 150 - 400 K/uL    A/POS/-- (03/22 1007)  IMAGING US Abdomen Complete  01/12/2016  CLINICAL DATA:  25 year old female with vomiting, abnormal liver enzymes and leukocytosis. Currently pregnant. Initial encounter. EXAM: ABDOMEN ULTRASOUND COMPLETE COMPARISON:  Ob ultrasound today reported separately. FINDINGS: Gallbladder: No gallstones or wall thickening visualized. No sonographic Murphy sign noted by sonographer. Common bile duct: Diameter: 4 mm, normal Liver: No focal lesion identified. Within normal limits in parenchymal echogenicity. IVC: No abnormality visualized. Pancreas: Visualized portion unremarkable. Spleen: Size and appearance within normal limits. Right Kidney: Length: 11.7 cm. Echogenicity within normal limits. No mass. Borderline to mild right hydronephrosis (image 23). Left Kidney: Length: 11.5 cm. Echogenicity within normal limits. No mass or hydronephrosis visualized. Abdominal aorta: No aneurysm visualized. Other findings: None. IMPRESSION: 1. Borderline to mild right hydronephrosis. In the absence of hematuria maternal hydronephrosis of pregnancy would be favored. 2. Normal sonographic appearance of the liver, gallbladder, and biliary tree. Electronically Signed   By: Odessa Fleming M.D.   On: 01/12/2016 12:03   US Ob Comp Less 14 Wks  01/12/2016  CLINICAL DATA:  Pregnant, negative fetal heart tones EXAM: OBSTETRIC  <14 WK ULTRASOUND TECHNIQUE: Transabdominal ultrasound was performed for evaluation of the gestation as well as the maternal uterus and adnexal regions. COMPARISON:  12/01/2015 FINDINGS: Intrauterine gestational sac: Single Yolk sac:  Present Embryo:  Present Cardiac Activity: Present Heart Rate: 153 bpm CRL:   66.3  mm   12 w 6 d                  [place] EDC: 07/20/2016 Subchorionic hemorrhage:  None visualized. Maternal uterus/adnexae: Bilateral ovaries are within normal limits, noting a left corpus luteal cyst. No pelvic fluid. IMPRESSION: Single live intrauterine gestation with estimated gestational age [redacted] weeks 6 days by crown-rump length. Fetal heart rate 153 beats per minute. Electronically Signed   By: Charline Bills M.D.   On: 01/12/2016 11:56    MAU Management/MDM: Ordered labs and reviewed results.  Documented weight gain of 13 lbs in last month indicates improvement in hyperemesis.  Likely pt food choices were a trigger for  n/v and she needs to take antiemetics as prescribed.  Treatments in MAU included Phenergan 25 mg suppository given vaginally. Pt spitting but no vomiting initially, but began to vomit after family members arrived.  Plan to discharge with outpatient management of n/v.  Pt stable at time of discharge.  ASSESSMENT 1. Encounter for supervision of normal first pregnancy in first trimester   2. Severe hyperemesis gravidarum     PLAN Discharge home Pt to use phenergan suppositories Q 6 hours and Zofran PO Q 8 hours as prescribed Return if no food/fluids stay down x 24 hours F/U in office as scheduled Return to MAU for emergencies    Medication List    ASK your doctor about these medications        glycopyrrolate 1 MG tablet  Commonly known as:  ROBINUL  Take 1 tablet (1 mg total) by mouth 3 (three) times daily.     ondansetron 8 MG disintegrating tablet  Commonly known as:  ZOFRAN ODT  Take 1 tablet (8 mg total) by mouth every 8 (eight) hours as needed for nausea  or vomiting.     promethazine 25 MG suppository  Commonly known as:  PHENERGAN  Place 1 suppository (25 mg total) rectally every 6 (six) hours as needed for nausea or vomiting.     promethazine 25 MG tablet  Commonly known as:  PHENERGAN  Take 1 tablet (25 mg total) by mouth every 6 (six) hours as needed for nausea or vomiting.     ranitidine 150 MG tablet  Commonly known as:  ZANTAC  Take 1 tablet (150 mg total) by mouth 2 (two) times daily.         Sharen CounterLisa Leftwich-Kirby Certified Nurse-Midwife 01/14/2016  5:27 PM

## 2016-01-14 NOTE — Discharge Instructions (Signed)
Contact Dermatitis °Dermatitis is redness, soreness, and swelling (inflammation) of the skin. Contact dermatitis is a reaction to certain substances that touch the skin. There are two types of contact dermatitis:  °· Irritant contact dermatitis. This type is caused by something that irritates your skin, such as dry hands from washing them too much. This type does not require previous exposure to the substance for a reaction to occur. This type is more common. °· Allergic contact dermatitis. This type is caused by a substance that you are allergic to, such as a nickel allergy or poison ivy. This type only occurs if you have been exposed to the substance (allergen) before. Upon a repeat exposure, your body reacts to the substance. This type is less common. °CAUSES  °Many different substances can cause contact dermatitis. Irritant contact dermatitis is most commonly caused by exposure to:  °· Makeup.   °· Soaps.   °· Detergents.   °· Bleaches.   °· Acids.   °· Metal salts, such as nickel.   °Allergic contact dermatitis is most commonly caused by exposure to:  °· Poisonous plants.   °· Chemicals.   °· Jewelry.   °· Latex.   °· Medicines.   °· Preservatives in products, such as clothing.   °RISK FACTORS °This condition is more likely to develop in:  °· People who have jobs that expose them to irritants or allergens. °· People who have certain medical conditions, such as asthma or eczema.   °SYMPTOMS  °Symptoms of this condition may occur anywhere on your body where the irritant has touched you or is touched by you. Symptoms include: °· Dryness or flaking.   °· Redness.   °· Cracks.   °· Itching.   °· Pain or a burning feeling.   °· Blisters. °· Drainage of small amounts of blood or clear fluid from skin cracks. °With allergic contact dermatitis, there may also be swelling in areas such as the eyelids, mouth, or genitals.  °DIAGNOSIS  °This condition is diagnosed with a medical history and physical exam. A patch skin test  may be performed to help determine the cause. If the condition is related to your job, you may need to see an occupational medicine specialist. °TREATMENT °Treatment for this condition includes figuring out what caused the reaction and protecting your skin from further contact. Treatment may also include:  °· Steroid creams or ointments. Oral steroid medicines may be needed in more severe cases. °· Antibiotics or antibacterial ointments, if a skin infection is present. °· Antihistamine lotion or an antihistamine taken by mouth to ease itching. °· A bandage (dressing). °HOME CARE INSTRUCTIONS °Skin Care  °· Moisturize your skin as needed.   °· Apply cool compresses to the affected areas. °· Try taking a bath with: °¨ Epsom salts. Follow the instructions on the packaging. You can get these at your local pharmacy or grocery store. °¨ Baking soda. Pour a small amount into the bath as directed by your health care provider. °¨ Colloidal oatmeal. Follow the instructions on the packaging. You can get this at your local pharmacy or grocery store. °· Try applying baking soda paste to your skin. Stir water into baking soda until it reaches a paste-like consistency. °· Do not scratch your skin. °· Bathe less frequently, such as every other day. °· Bathe in lukewarm water. Avoid using hot water. °Medicines  °· Take or apply over-the-counter and prescription medicines only as told by your health care provider.   °· If you were prescribed an antibiotic medicine, take or apply your antibiotic as told by your health care provider. Do not stop using the   antibiotic even if your condition starts to improve. General Instructions  Keep all follow-up visits as told by your health care provider. This is important.  Avoid the substance that caused your reaction. If you do not know what caused it, keep a journal to try to track what caused it. Write down:  What you eat.  What cosmetic products you use.  What you drink.  What  you wear in the affected area. This includes jewelry.  If you were given a dressing, take care of it as told by your health care provider. This includes when to change and remove it. SEEK MEDICAL CARE IF:   Your condition does not improve with treatment.  Your condition gets worse.  You have signs of infection such as swelling, tenderness, redness, soreness, or warmth in the affected area.  You have a fever.  You have new symptoms. SEEK IMMEDIATE MEDICAL CARE IF:   You have a severe headache, neck pain, or neck stiffness.  You vomit.  You feel very sleepy.  You notice red streaks coming from the affected area.  Your bone or joint underneath the affected area becomes painful after the skin has healed.  The affected area turns darker.  You have difficulty breathing.   This information is not intended to replace advice given to you by your health care provider. Make sure you discuss any questions you have with your health care provider.   Document Released: 08/18/2000 Document Revised: 05/12/2015 Document Reviewed: 01/06/2015 Elsevier Interactive Patient Education 2016 [employer]  Hyperemesis Gravidarum Hyperemesis gravidarum is a severe form of nausea and vomiting that happens during pregnancy. Hyperemesis is worse than morning sickness. It may cause you to have nausea or vomiting all day for many days. It may keep you from eating and drinking enough food and liquids. Hyperemesis usually occurs during the first half (the first 20 weeks) of pregnancy. It often goes away once a woman is in her second half of pregnancy. However, sometimes hyperemesis continues through an entire pregnancy.  CAUSES  The cause of this condition is not completely known but is thought to be related to changes in the body's hormones when pregnant. It could be from the high level of the pregnancy hormone or an increase in estrogen in the body.  SIGNS AND SYMPTOMS   Severe nausea and  vomiting.  Nausea that does not go away.  Vomiting that does not allow you to keep any food down.  Weight loss and body fluid loss (dehydration).  Having no desire to eat or not liking food you have previously enjoyed. DIAGNOSIS  Your health care provider will do a physical exam and ask you about your symptoms. He or she may also order blood tests and urine tests to make sure something else is not causing the problem.  TREATMENT  You may only need medicine to control the problem. If medicines do not control the nausea and vomiting, you will be treated in the hospital to prevent dehydration, increased acid in the blood (acidosis), weight loss, and changes in the electrolytes in your body that may harm the unborn baby (fetus). You may need IV fluids.  HOME CARE INSTRUCTIONS   Only take over-the-counter or prescription medicines as directed by your health care provider.  Try eating a couple of dry crackers or toast in the morning before getting out of bed.  Avoid foods and smells that upset your stomach.  Avoid fatty and spicy foods.  Eat 5-6 small meals a day.  Do not drink when eating meals. Drink between meals.  For snacks, eat high-protein foods, such as cheese.  Eat or suck on things that have ginger in them. Ginger helps nausea.  Avoid food preparation. The smell of food can spoil your appetite.  Avoid iron pills and iron in your multivitamins until after 3-4 months of being pregnant. However, consult with your health care provider before stopping any prescribed iron pills. SEEK MEDICAL CARE IF:   Your abdominal pain increases.  You have a severe headache.  You have vision problems.  You are losing weight. SEEK IMMEDIATE MEDICAL CARE IF:   You are unable to keep fluids down.  You vomit blood.  You have constant nausea and vomiting.  You have excessive weakness.  You have extreme thirst.  You have dizziness or fainting.  You have a fever or persistent  symptoms for more than 2-3 days.  You have a fever and your symptoms suddenly get worse. MAKE SURE YOU:   Understand these instructions.  Will watch your condition.  Will get help right away if you are not doing well or get worse.   This information is not intended to replace advice given to you by your health care provider. Make sure you discuss any questions you have with your health care provider.   Document Released: 08/21/2005 Document Revised: 06/11/2013 Document Reviewed: 04/02/2013 Elsevier Interactive Patient Education [employer]

## 2016-01-14 NOTE — MAU Note (Signed)
Vomiting, can't keep anything down.  Can't keep the meds down.  Feel weak.

## 2016-01-28 ENCOUNTER — Telehealth: Payer: Self-pay | Admitting: Family Medicine

## 2016-01-28 NOTE — Telephone Encounter (Signed)
**  After Hours/ Emergency Line Call*  Received a call to report that Sharon Barnett has been having severe nausea and vomiting for 3 days now.  Her mother reports that patient is 4 months pregnant and has been having difficulty with emesis the entire pregnancy, citing [facility].  She notes that she called [facility] and spoke to the RN, who recommended that patient call PCP prior to going to MAU.  She notes that patient is taking Promethazine suppositories per vagina, Zofran ODT 8mg , Robinul and Zantac BID as directed.  Meds help some but not lasting long enough.  She reports that patient has not tolerated PO for 3 days.  She is unable to stay hydrated, as she cannot even tolerate water without vomiting.  She reports dizziness with standing.  She endorses malaise.  No fevers, dysuria, cough, congestion, diarrhea, sick contacts, hematemesis.  No CP, SOB, palpitations.  Denying consumption of undercooked foods or foods that have been left out too long.  +FM.  Recommended that she seek evaluation in the MAU.  There is concern for dehydration in the setting of decreased PO intake x3 days and dizziness.  Red flags discussed.  Mother voices good understanding and will bring patient in for evaluation.  Could consider adding B6 to regimen, though patient seems refractory to several medications.  Will forward to PCP.  Ashly M. Nadine CountsGottschalk, DO PGY-2, [facility]

## 2016-01-29 ENCOUNTER — Inpatient Hospital Stay (HOSPITAL_COMMUNITY)
Admission: AD | Admit: 2016-01-29 | Discharge: 2016-01-29 | Disposition: A | Discharge status: Home / Self Care | Payer: Medicaid Other | Source: Ambulatory Visit | Attending: Family Medicine | Admitting: Family Medicine

## 2016-01-29 ENCOUNTER — Encounter (HOSPITAL_COMMUNITY): Payer: Self-pay | Admitting: *Deleted

## 2016-01-29 DIAGNOSIS — O210 Mild hyperemesis gravidarum: Secondary | ICD-10-CM | POA: Insufficient documentation

## 2016-01-29 DIAGNOSIS — O211 Hyperemesis gravidarum with metabolic disturbance: Secondary | ICD-10-CM

## 2016-01-29 DIAGNOSIS — Z3A14 14 weeks gestation of pregnancy: Secondary | ICD-10-CM | POA: Insufficient documentation

## 2016-01-29 LAB — CBC
HCT: 35.3 % — ABNORMAL LOW (ref 36.0–46.0)
Hemoglobin: 12.3 g/dL (ref 12.0–15.0)
MCH: 31.1 pg (ref 26.0–34.0)
MCHC: 34.8 g/dL (ref 30.0–36.0)
MCV: 89.1 fL (ref 78.0–100.0)
PLATELETS: 395 10*3/uL (ref 150–400)
RBC: 3.96 MIL/uL (ref 3.87–5.11)
RDW: 13.4 % (ref 11.5–15.5)
WBC: 6.2 10*3/uL (ref 4.0–10.5)

## 2016-01-29 LAB — COMPREHENSIVE METABOLIC PANEL
ALBUMIN: 3.6 g/dL (ref 3.5–5.0)
ALK PHOS: 51 U/L (ref 38–126)
ALT: 39 U/L (ref 14–54)
AST: 34 U/L (ref 15–41)
Anion gap: 12 (ref 5–15)
BUN: 10 mg/dL (ref 6–20)
CALCIUM: 9.4 mg/dL (ref 8.9–10.3)
CHLORIDE: 102 mmol/L (ref 101–111)
CO2: 22 mmol/L (ref 22–32)
CREATININE: 0.61 mg/dL (ref 0.44–1.00)
GFR calc non Af Amer: >60 mL/min (ref >60)
GLUCOSE: 77 mg/dL (ref 65–99)
Potassium: 3.6 mmol/L (ref 3.5–5.1)
SODIUM: 136 mmol/L (ref 135–145)
Total Bilirubin: 0.6 mg/dL (ref 0.3–1.2)
Total Protein: 7.3 g/dL (ref 6.5–8.1)

## 2016-01-29 LAB — URINE MICROSCOPIC-ADD ON

## 2016-01-29 LAB — AMYLASE: Amylase: 90 U/L (ref 28–100)

## 2016-01-29 LAB — URINALYSIS, ROUTINE W REFLEX MICROSCOPIC
Bilirubin Urine: NEGATIVE
Glucose, UA: NEGATIVE mg/dL
Hgb urine dipstick: NEGATIVE
Ketones, ur: >80 mg/dL — AB
NITRITE: NEGATIVE
PROTEIN: NEGATIVE mg/dL
pH: 5.5 (ref 5.0–8.0)

## 2016-01-29 LAB — LIPASE, BLOOD: Lipase: 15 U/L (ref 11–51)

## 2016-01-29 MED ORDER — DEXTROSE 5 % IN LACTATED RINGERS IV BOLUS: 1000.0000 mL | Freq: Once | INTRAVENOUS | Status: DC

## 2016-01-29 MED ORDER — SODIUM CHLORIDE 0.9 % IV SOLN
8.0000 mg | Freq: Once | INTRAVENOUS | Status: AC
  Administered 2016-01-29: 8 mg via INTRAVENOUS
  Filled 2016-01-29: qty 4

## 2016-01-29 MED ORDER — FAMOTIDINE IN NACL 20-0.9 MG/50ML-% IV SOLN
40.0000 mg | Freq: Once | INTRAVENOUS | Status: AC
  Administered 2016-01-29: 40 mg via INTRAVENOUS
  Filled 2016-01-29 (×2): qty 100

## 2016-01-29 MED ORDER — PROMETHAZINE HCL 25 MG/ML IJ SOLN
25.0000 mg | Freq: Once | INTRAVENOUS | Status: AC
  Administered 2016-01-29: 25 mg via INTRAVENOUS
  Filled 2016-01-29: qty 1

## 2016-01-29 MED ORDER — PROMETHAZINE HCL 25 MG RE SUPP: 25.0000 mg | Freq: Four times a day (QID) | RECTAL | Status: DC | PRN

## 2016-01-29 MED ORDER — M.V.I. ADULT IV INJ
Freq: Once | INTRAVENOUS | Status: AC
  Administered 2016-01-29: 13:00:00 via INTRAVENOUS
  Filled 2016-01-29: qty 1000

## 2016-01-29 NOTE — MAU Note (Addendum)
Patient presents with weakness and unable to keep anything down for 3 days, having intermittent lower abdominal pain when vomiting. Patient states that she cannot urinate for a sample.

## 2016-01-29 NOTE — MAU Provider Note (Signed)
History     CSN: [id number]  Arrival date and time: 01/29/16 1018   First Provider Initiated Contact with Patient 01/29/16 1102      Chief Complaint  Patient presents with  . Emesis During Pregnancy   HPI    Ms.Sharon Barnett is a 25 y.o. female G1P0000 @ 56w3dpresenting to MAU with vomiting. She has been vomiting all day every day. She is spitting as well. She reports that she is taking phenergan Q6 and Zofran Q8. She reports taking robinul, zofran, zantac, and phenergan @ 0400.  She has been diagnosed with severe hyperemesis; [facility]   She just completed a round of prednisone and the patient feels her symptoms improved with the steroids.   OB History    Gravida Para Term Preterm AB TAB SAB Ectopic Multiple Living   '1 0 0 0 0 0 0 0 0 0 '$      Past Medical History  Diagnosis Date  . Boils   . ADHD (attention deficit hyperactivity disorder)   . Complication of anesthesia     Hypersensitive to anesthesia (wisdom teeth)  . Vaginal Pap smear, abnormal     Past Surgical History  Procedure Laterality Date  . Wisdom tooth extraction      Family History  Problem Relation Age of Onset  . Depression Mother   . Diabetes Mother   . Hypertension Mother   . Miscarriages / [place]   . Asthma Sister   . Depression Sister   . Hearing loss Sister   . Birth defects Sister   . Hyperlipidemia Sister   . Hypertension Sister   . Miscarriages / Stillbirths Sister   . Asthma Maternal Grandmother   . Cancer Maternal Grandmother   . Diabetes Maternal Grandmother   . Hypertension Maternal Grandmother   . Stroke Maternal Grandmother   . Vision loss Maternal Grandmother   . Cancer Paternal Grandmother     Social History  Substance Use Topics  . Smoking status: Never Smoker   . Smokeless tobacco: Never Used  . Alcohol Use: No    Allergies: No Known Allergies  Prescriptions prior to admission  Medication Sig  Dispense Refill Last Dose  . glycopyrrolate (ROBINUL) 1 MG tablet Take 1 tablet (1 mg total) by mouth 3 (three) times daily. 30 tablet 0 01/29/2016 at Unknown time  . ondansetron (ZOFRAN ODT) 8 MG disintegrating tablet Take 1 tablet (8 mg total) by mouth every 8 (eight) hours as needed for nausea or vomiting. 30 tablet 0 01/29/2016 at Unknown time  . promethazine (PHENERGAN) 25 MG suppository Place 1 suppository (25 mg total) rectally every 6 (six) hours as needed for nausea or vomiting. 12 each 2 Past Week at Unknown time  . ranitidine (ZANTAC) 150 MG tablet Take 1 tablet (150 mg total) by mouth 2 (two) times daily. 60 tablet 0 Past Week at Unknown time  . hydrocortisone cream 1 % Apply to affected area 2 times daily 15 g 0    Results for orders placed or performed during the hospital encounter of 01/29/16 (from the past 48 hour(s))  [facility]inal[facility], [facility]     Status: Abnormal   Collection Time: 01/29/16 11:16 AM  Result Value Ref Range   Color, Urine YELLOW YELLOW   APPearance HAZY (A) CLEAR   Specific Gravity, Urine >1.030 (H) 1.005 - 1.030   pH 5.5 5.0 - 8.0   Glucose,  UA NEGATIVE NEGATIVE mg/dL   Hgb urine dipstick NEGATIVE NEGATIVE   Bilirubin Urine NEGATIVE NEGATIVE   Ketones, ur >80 (A) NEGATIVE mg/dL   Protein, ur NEGATIVE NEGATIVE mg/dL   Nitrite NEGATIVE NEGATIVE   Leukocytes, UA TRACE (A) NEGATIVE  Urine microscopic-add on     Status: Abnormal   Collection Time: 01/29/16 11:16 AM  Result Value Ref Range   Squamous Epithelial / LPF 0-5 (A) NONE SEEN   WBC, UA 6-30 0 - 5 WBC/hpf   RBC / HPF 0-5 0 - 5 RBC/hpf   Bacteria, UA MANY (A) NONE SEEN  Comprehensive metabolic panel     Status: None   Collection Time: 01/29/16 11:30 AM  Result Value Ref Range   Sodium 136 135 - 145 mmol/L   Potassium 3.6 3.5 - 5.1 mmol/L   Chloride 102 101 - 111 mmol/L   CO2 22 22 - 32 mmol/L   Glucose, Bld 77 65 - 99 mg/dL   BUN 10 6 - 20 mg/dL   Creatinine, Ser  0.61 0.44 - 1.00 mg/dL   Calcium 9.4 8.9 - 10.3 mg/dL   Total Protein 7.3 6.5 - 8.1 g/dL   Albumin 3.6 3.5 - 5.0 g/dL   AST 34 15 - 41 U/L   ALT 39 14 - 54 U/L   Alkaline Phosphatase 51 38 - 126 U/L   Total Bilirubin 0.6 0.3 - 1.2 mg/dL   GFR calc non Af Amer >60 >60 mL/min   GFR calc Af Amer >60 >60 mL/min    Comment: (NOTE) The eGFR has been calculated using the CKD EPI equation. This calculation has not been validated in all clinical situations. eGFR's persistently <60 mL/min signify possible Chronic Kidney Disease.    Anion gap 12 5 - 15  CBC     Status: Abnormal   Collection Time: 01/29/16 11:30 AM  Result Value Ref Range   WBC 6.2 4.0 - 10.5 K/uL   RBC 3.96 3.87 - 5.11 MIL/uL   Hemoglobin 12.3 12.0 - 15.0 g/dL   HCT 35.3 (L) 36.0 - 46.0 %   MCV 89.1 78.0 - 100.0 fL   MCH 31.1 26.0 - 34.0 pg   MCHC 34.8 30.0 - 36.0 g/dL   RDW 13.4 11.5 - 15.5 %   Platelets 395 150 - 400 K/uL  Amylase     Status: None   Collection Time: 01/29/16 11:30 AM  Result Value Ref Range   Amylase 90 28 - 100 U/L  Lipase, blood     Status: None   Collection Time: 01/29/16 11:30 AM  Result Value Ref Range   Lipase 15 11 - 51 U/L    Review of Systems  Constitutional: Negative for fever and chills.  Gastrointestinal: Positive for heartburn, nausea, vomiting and abdominal pain (Occasional cramping in lower abdomen. ). Negative for diarrhea and constipation.  Genitourinary: Negative for dysuria, urgency and frequency.   Physical Exam   Blood pressure 120/68, pulse 97, temperature 98.1 F (36.7 C), temperature source Oral, resp. rate 16, height 5' 8.74" (1.746 m), weight 117 lb (53.071 kg), last menstrual period 10/20/2015, SpO2 100 %.  Physical Exam  Constitutional: She is oriented to person, place, and time. She appears well-developed and well-nourished. No distress.  HENT:  Head: Normocephalic.  Eyes: Pupils are equal, round, and reactive to light.  Neck: Neck supple.  Respiratory:  Effort normal.  GI: Soft. She exhibits no distension and no mass. There is no tenderness. There is no rebound and no  guarding.  Musculoskeletal: Normal range of motion.  Neurological: She is alert and oriented to person, place, and time.  Skin: Skin is warm. She is not diaphoretic.  Psychiatric: Her behavior is normal.    MAU Course  Procedures  None  MDM  + fetal heart tones via doppler.  Urine culture pending  Phenergan Bolus 25 mg MVI X 1 Zofran 8 mg IV Pepcid 40 mg IV  Patient tolerating PO fluids, requesting multiple cups of juice.   Assessment and Plan   A:  1. Hyperemesis gravidarum before end of [redacted] week gestation, dehydration     P:  Discharge home in stable condition Continue current medication regimen Follow up with [facility]- patient may benefit from [place].  Patient to return to MAU if symptoms worsen Small, frequent meals.    Lezlie Lye, NP 01/29/2016 6:41 PM

## 2016-01-29 NOTE — Discharge Instructions (Signed)
Hyperemesis Gravidarum °Hyperemesis gravidarum is a severe form of nausea and vomiting that happens during pregnancy. Hyperemesis is worse than morning sickness. It may cause you to have nausea or vomiting all day for many days. It may keep you from eating and drinking enough food and liquids. Hyperemesis usually occurs during the first half (the first 20 weeks) of pregnancy. It often goes away once a woman is in her second half of pregnancy. However, sometimes hyperemesis continues through an entire pregnancy.  °CAUSES  °The cause of this condition is not completely known but is thought to be related to changes in the body's hormones when pregnant. It could be from the high level of the pregnancy hormone or an increase in estrogen in the body.  °SIGNS AND SYMPTOMS  °· Severe nausea and vomiting. °· Nausea that does not go away. °· Vomiting that does not allow you to keep any food down. °· Weight loss and body fluid loss (dehydration). °· Having no desire to eat or not liking food you have previously enjoyed. °DIAGNOSIS  °Your health care provider will do a physical exam and ask you about your symptoms. He or she may also order blood tests and urine tests to make sure something else is not causing the problem.  °TREATMENT  °You may only need medicine to control the problem. If medicines do not control the nausea and vomiting, you will be treated in the hospital to prevent dehydration, increased acid in the blood (acidosis), weight loss, and changes in the electrolytes in your body that may harm the unborn baby (fetus). You may need IV fluids.  °HOME CARE INSTRUCTIONS  °· Only take over-the-counter or prescription medicines as directed by your health care provider. °· Try eating a couple of dry crackers or toast in the morning before getting out of bed. °· Avoid foods and smells that upset your stomach. °· Avoid fatty and spicy foods. °· Eat 5-6 small meals a day. °· Do not drink when eating meals. Drink between  meals. °· For snacks, eat high-protein foods, such as cheese. °· Eat or suck on things that have ginger in them. Ginger helps nausea. °· Avoid food preparation. The smell of food can spoil your appetite. °· Avoid iron pills and iron in your multivitamins until after 3-4 months of being pregnant. However, consult with your health care provider before stopping any prescribed iron pills. °SEEK MEDICAL CARE IF:  °· Your abdominal pain increases. °· You have a severe headache. °· You have vision problems. °· You are losing weight. °SEEK IMMEDIATE MEDICAL CARE IF:  °· You are unable to keep fluids down. °· You vomit blood. °· You have constant nausea and vomiting. °· You have excessive weakness. °· You have extreme thirst. °· You have dizziness or fainting. °· You have a fever or persistent symptoms for more than 2-3 days. °· You have a fever and your symptoms suddenly get worse. °MAKE SURE YOU:  °· Understand these instructions. °· Will watch your condition. °· Will get help right away if you are not doing well or get worse. °  °This information is not intended to replace advice given to you by your health care provider. Make sure you discuss any questions you have with your health care provider. °  °Document Released: 08/21/2005 Document Revised: 06/11/2013 Document Reviewed: 04/02/2013 °Elsevier Interactive Patient Education ©2016 [employer] ° °Eating Plan for Hyperemesis Gravidarum °Severe cases of hyperemesis gravidarum can lead to dehydration and malnutrition. The hyperemesis eating plan is   one way to lessen the symptoms of nausea and vomiting. It is often used with prescribed medicines to control your symptoms.  °WHAT CAN I DO TO RELIEVE MY SYMPTOMS? °Listen to your body. Everyone is different and has different preferences. Find what works best for you. Some of the following things may help: °· Eat and drink slowly. °· Eat 5-6 small meals daily instead of 3 large meals.   °· Eat crackers before you get out of  bed in the morning.   °· Starchy foods are usually well tolerated (such as cereal, toast, bread, potatoes, pasta, rice, and pretzels).   °· Ginger may help with nausea. Add ¼ tsp ground ginger to hot tea or choose ginger tea.   °· Try drinking 100% fruit juice or an electrolyte drink. °· Continue to take your prenatal vitamins as directed by your health care provider. If you are having trouble taking your prenatal vitamins, talk with your health care provider about different options. °· Include at least 1 serving of protein with your meals and snacks (such as meats or poultry, beans, nuts, eggs, or yogurt). Try eating a protein-rich snack before bed (such as cheese and crackers or a half turkey or peanut butter sandwich). °WHAT THINGS SHOULD I AVOID TO REDUCE MY SYMPTOMS? °The following things may help reduce your symptoms: °· Avoid foods with strong smells. Try eating meals in well-ventilated areas that are free of odors. °· Avoid drinking water or other beverages with meals. Try not to drink anything less than 30 minutes before and after meals. °· Avoid drinking more than 1 cup of fluid at a time. °· Avoid fried or high-fat foods, such as butter and cream sauces. °· Avoid spicy foods. °· Avoid skipping meals the best you can. Nausea can be more intense on an empty stomach. If you cannot tolerate food at that time, do not force it. Try sucking on ice chips or other frozen items and make up the calories later. °· Avoid lying down within 2 hours after eating. °  °This information is not intended to replace advice given to you by your health care provider. Make sure you discuss any questions you have with your health care provider. °  °Document Released: 06/18/2007 Document Revised: 08/26/2013 Document Reviewed: 06/25/2013 °Elsevier Interactive Patient Education ©2016 Elsevier Inc. ° ° °

## 2016-01-31 LAB — URINE CULTURE: Special Requests: NORMAL

## 2016-02-04 ENCOUNTER — Ambulatory Visit (INDEPENDENT_AMBULATORY_CARE_PROVIDER_SITE_OTHER): Payer: BLUE CROSS/BLUE SHIELD | Admitting: Family Medicine

## 2016-02-04 VITALS — BP 121/71 | HR 114 | Temp 98.2°F | Wt 134.0 lb

## 2016-02-04 DIAGNOSIS — Z3402 Encounter for supervision of normal first pregnancy, second trimester: Secondary | ICD-10-CM

## 2016-02-04 MED ORDER — PROMETHAZINE HCL 25 MG PO TABS: 25.0000 mg | ORAL_TABLET | Freq: Three times a day (TID) | ORAL | Status: DC | PRN

## 2016-02-04 MED ORDER — ONDANSETRON 8 MG PO TBDP: 8.0000 mg | ORAL_TABLET | Freq: Three times a day (TID) | ORAL | Status: DC | PRN

## 2016-02-04 MED FILL — PROMETHAZINE 25 MG TABLET: 25 | 6 days supply | Qty: 20 | Fill #0

## 2016-02-04 MED FILL — ONDANSETRON ODT 8 MG TABLET: 8 | 10 days supply | Qty: 30 | Fill #0

## 2016-02-04 NOTE — Assessment & Plan Note (Signed)
See note

## 2016-02-04 NOTE — Patient Instructions (Addendum)

## 2016-02-04 NOTE — Progress Notes (Signed)
Sharon Barnett is a 25 y.o. G1P0 at 1652w2d for routine follow up.  She reports headache, nausea, no bleeding, no contractions, no cramping, no leaking and vomiting See flow sheet for details. Patient has been relatively well controlled w/ current medications, but has had significant hyperemesis gravidarum. But good weight gain today.  A/P: Pregnancy at 6852w2d.  Doing well. Gaining Weight. Pregnancy issues include hyperemesis gravidarum.  - CBC and Quad screen ordered for next week. Patient instructed to come in for lab visit. Anatomy ultrasound ordered for June 28th @1 :45pm Pt  is interested in genetic screening. Bleeding and pain precautions reviewed. Follow up 4 weeks.

## 2016-02-07 ENCOUNTER — Inpatient Hospital Stay (HOSPITAL_COMMUNITY)
Admission: AD | Admit: 2016-02-07 | Discharge: 2016-02-07 | Disposition: A | Discharge status: Home / Self Care | Payer: Medicaid Other | Source: Ambulatory Visit | Attending: Obstetrics & Gynecology | Admitting: Obstetrics & Gynecology

## 2016-02-07 DIAGNOSIS — O211 Hyperemesis gravidarum with metabolic disturbance: Secondary | ICD-10-CM | POA: Insufficient documentation

## 2016-02-07 DIAGNOSIS — Z3A15 15 weeks gestation of pregnancy: Secondary | ICD-10-CM | POA: Insufficient documentation

## 2016-02-07 DIAGNOSIS — O210 Mild hyperemesis gravidarum: Secondary | ICD-10-CM | POA: Diagnosis present

## 2016-02-07 LAB — CBC
HEMATOCRIT: 29.3 % — AB (ref 36.0–46.0)
Hemoglobin: 9.7 g/dL — ABNORMAL LOW (ref 12.0–15.0)
MCH: 30.1 pg (ref 26.0–34.0)
MCHC: 33.1 g/dL (ref 30.0–36.0)
MCV: 91 fL (ref 78.0–100.0)
Platelets: 275 10*3/uL (ref 150–400)
RBC: 3.22 MIL/uL — ABNORMAL LOW (ref 3.87–5.11)
RDW: 13.7 % (ref 11.5–15.5)
WBC: 8.9 10*3/uL (ref 4.0–10.5)

## 2016-02-07 LAB — COMPREHENSIVE METABOLIC PANEL
ALBUMIN: 2.9 g/dL — AB (ref 3.5–5.0)
ALK PHOS: 39 U/L (ref 38–126)
ALT: 99 U/L — ABNORMAL HIGH (ref 14–54)
ANION GAP: 10 (ref 5–15)
AST: 81 U/L — AB (ref 15–41)
BILIRUBIN TOTAL: 0.5 mg/dL (ref 0.3–1.2)
BUN: 8 mg/dL (ref 6–20)
CALCIUM: 8.4 mg/dL — AB (ref 8.9–10.3)
CO2: 21 mmol/L — ABNORMAL LOW (ref 22–32)
Chloride: 100 mmol/L — ABNORMAL LOW (ref 101–111)
Creatinine, Ser: 0.51 mg/dL (ref 0.44–1.00)
GFR calc Af Amer: >60 mL/min (ref >60)
GFR calc non Af Amer: >60 mL/min (ref >60)
GLUCOSE: 242 mg/dL — AB (ref 65–99)
Potassium: 3.3 mmol/L — ABNORMAL LOW (ref 3.5–5.1)
Sodium: 131 mmol/L — ABNORMAL LOW (ref 135–145)
TOTAL PROTEIN: 6 g/dL — AB (ref 6.5–8.1)

## 2016-02-07 LAB — URINALYSIS, ROUTINE W REFLEX MICROSCOPIC
Bilirubin Urine: NEGATIVE
GLUCOSE, UA: NEGATIVE mg/dL
Hgb urine dipstick: NEGATIVE
Ketones, ur: >80 mg/dL — AB
LEUKOCYTES UA: NEGATIVE
Nitrite: NEGATIVE
PROTEIN: NEGATIVE mg/dL
pH: 6 (ref 5.0–8.0)

## 2016-02-07 MED ORDER — PROMETHAZINE HCL 25 MG/ML IJ SOLN
25.0000 mg | Freq: Once | INTRAVENOUS | Status: AC
  Administered 2016-02-07: 25 mg via INTRAVENOUS
  Filled 2016-02-07: qty 1

## 2016-02-07 MED ORDER — PROMETHAZINE HCL 25 MG PO TABS: 25.0000 mg | ORAL_TABLET | Freq: Four times a day (QID) | ORAL | Status: DC | PRN

## 2016-02-07 MED ORDER — ONDANSETRON HCL 40 MG/20ML IJ SOLN
8.0000 mg | Freq: Once | INTRAMUSCULAR | Status: AC
  Administered 2016-02-07: 8 mg via INTRAVENOUS
  Filled 2016-02-07: qty 4

## 2016-02-07 MED ORDER — DEXTROSE 5 % IN LACTATED RINGERS IV BOLUS
1000.0000 mL | Freq: Once | INTRAVENOUS | Status: AC
  Administered 2016-02-07: 1000 mL via INTRAVENOUS

## 2016-02-07 MED ORDER — FAMOTIDINE IN NACL 20-0.9 MG/50ML-% IV SOLN
20.0000 mg | Freq: Once | INTRAVENOUS | Status: AC
  Administered 2016-02-07: 20 mg via INTRAVENOUS
  Filled 2016-02-07: qty 50

## 2016-02-07 NOTE — Discharge Instructions (Signed)
Hyperemesis Gravidarum °Hyperemesis gravidarum is a severe form of nausea and vomiting that happens during pregnancy. Hyperemesis is worse than morning sickness. It may cause you to have nausea or vomiting all day for many days. It may keep you from eating and drinking enough food and liquids. Hyperemesis usually occurs during the first half (the first 20 weeks) of pregnancy. It often goes away once a woman is in her second half of pregnancy. However, sometimes hyperemesis continues through an entire pregnancy.  °CAUSES  °The cause of this condition is not completely known but is thought to be related to changes in the body's hormones when pregnant. It could be from the high level of the pregnancy hormone or an increase in estrogen in the body.  °SIGNS AND SYMPTOMS  °· Severe nausea and vomiting. °· Nausea that does not go away. °· Vomiting that does not allow you to keep any food down. °· Weight loss and body fluid loss (dehydration). °· Having no desire to eat or not liking food you have previously enjoyed. °DIAGNOSIS  °Your health care provider will do a physical exam and ask you about your symptoms. He or she may also order blood tests and urine tests to make sure something else is not causing the problem.  °TREATMENT  °You may only need medicine to control the problem. If medicines do not control the nausea and vomiting, you will be treated in the hospital to prevent dehydration, increased acid in the blood (acidosis), weight loss, and changes in the electrolytes in your body that may harm the unborn baby (fetus). You may need IV fluids.  °HOME CARE INSTRUCTIONS  °· Only take over-the-counter or prescription medicines as directed by your health care provider. °· Try eating a couple of dry crackers or toast in the morning before getting out of bed. °· Avoid foods and smells that upset your stomach. °· Avoid fatty and spicy foods. °· Eat 5-6 small meals a day. °· Do not drink when eating meals. Drink between  meals. °· For snacks, eat high-protein foods, such as cheese. °· Eat or suck on things that have ginger in them. Ginger helps nausea. °· Avoid food preparation. The smell of food can spoil your appetite. °· Avoid iron pills and iron in your multivitamins until after 3-4 months of being pregnant. However, consult with your health care provider before stopping any prescribed iron pills. °SEEK MEDICAL CARE IF:  °· Your abdominal pain increases. °· You have a severe headache. °· You have vision problems. °· You are losing weight. °SEEK IMMEDIATE MEDICAL CARE IF:  °· You are unable to keep fluids down. °· You vomit blood. °· You have constant nausea and vomiting. °· You have excessive weakness. °· You have extreme thirst. °· You have dizziness or fainting. °· You have a fever or persistent symptoms for more than 2-3 days. °· You have a fever and your symptoms suddenly get worse. °MAKE SURE YOU:  °· Understand these instructions. °· Will watch your condition. °· Will get help right away if you are not doing well or get worse. °  °This information is not intended to replace advice given to you by your health care provider. Make sure you discuss any questions you have with your health care provider. °  °Document Released: 08/21/2005 Document Revised: 06/11/2013 Document Reviewed: 04/02/2013 °Elsevier Interactive Patient Education ©2016 [employer] ° °Eating Plan for Hyperemesis Gravidarum °Severe cases of hyperemesis gravidarum can lead to dehydration and malnutrition. The hyperemesis eating plan is   one way to lessen the symptoms of nausea and vomiting. It is often used with prescribed medicines to control your symptoms.  °WHAT CAN I DO TO RELIEVE MY SYMPTOMS? °Listen to your body. Everyone is different and has different preferences. Find what works best for you. Some of the following things may help: °· Eat and drink slowly. °· Eat 5-6 small meals daily instead of 3 large meals.   °· Eat crackers before you get out of  bed in the morning.   °· Starchy foods are usually well tolerated (such as cereal, toast, bread, potatoes, pasta, rice, and pretzels).   °· Ginger may help with nausea. Add ¼ tsp ground ginger to hot tea or choose ginger tea.   °· Try drinking 100% fruit juice or an electrolyte drink. °· Continue to take your prenatal vitamins as directed by your health care provider. If you are having trouble taking your prenatal vitamins, talk with your health care provider about different options. °· Include at least 1 serving of protein with your meals and snacks (such as meats or poultry, beans, nuts, eggs, or yogurt). Try eating a protein-rich snack before bed (such as cheese and crackers or a half turkey or peanut butter sandwich). °WHAT THINGS SHOULD I AVOID TO REDUCE MY SYMPTOMS? °The following things may help reduce your symptoms: °· Avoid foods with strong smells. Try eating meals in well-ventilated areas that are free of odors. °· Avoid drinking water or other beverages with meals. Try not to drink anything less than 30 minutes before and after meals. °· Avoid drinking more than 1 cup of fluid at a time. °· Avoid fried or high-fat foods, such as butter and cream sauces. °· Avoid spicy foods. °· Avoid skipping meals the best you can. Nausea can be more intense on an empty stomach. If you cannot tolerate food at that time, do not force it. Try sucking on ice chips or other frozen items and make up the calories later. °· Avoid lying down within 2 hours after eating. °  °This information is not intended to replace advice given to you by your health care provider. Make sure you discuss any questions you have with your health care provider. °  °Document Released: 06/18/2007 Document Revised: 08/26/2013 Document Reviewed: 06/25/2013 °Elsevier Interactive Patient Education ©2016 Elsevier Inc. ° ° °

## 2016-02-07 NOTE — MAU Provider Note (Signed)
History     CSN: [id number]  Arrival date and time: 02/07/16 [id number]   First Provider Initiated Contact with Patient 02/07/16 1030        Chief Complaint  Patient presents with  . Hyperemesis Gravidarum   HPI   Ms. Irish Lackynneesha C Usrey is a 25 y.o. female G1P0000 @ 2571w5d with severe hyperemesis here today with worsening nausea/vomiting.  She has multiple visits to MAU; 10 visits with this pregnancy due to severe hyperemesis.  Ran out of promethazine, requested refill from OB who sent it to the wrong pharmacy.  Denies abdominal pain, heartburn, diarrhea, vaginal bleeding.   OB History    Gravida Para Term Preterm AB TAB SAB Ectopic Multiple Living   1 0 0 0 0 0 0 0 0 0       Past Medical History  Diagnosis Date  . Boils   . ADHD (attention deficit hyperactivity disorder)   . Complication of anesthesia     Hypersensitive to anesthesia (wisdom teeth)  . Vaginal Pap smear, abnormal     Past Surgical History  Procedure Laterality Date  . Wisdom tooth extraction      Family History  Problem Relation Age of Onset  . Depression Mother   . Diabetes Mother   . Hypertension Mother   . Miscarriages / [place] Mother   . Asthma Sister   . Depression Sister   . Hearing loss Sister   . Birth defects Sister   . Hyperlipidemia Sister   . Hypertension Sister   . Miscarriages / Stillbirths Sister   . Asthma Maternal Grandmother   . Cancer Maternal Grandmother   . Diabetes Maternal Grandmother   . Hypertension Maternal Grandmother   . Stroke Maternal Grandmother   . Vision loss Maternal Grandmother   . Cancer Paternal Grandmother     Social History  Substance Use Topics  . Smoking status: Never Smoker   . Smokeless tobacco: Never Used  . Alcohol Use: No    Allergies: No Known Allergies  Prescriptions prior to admission  Medication Sig Dispense Refill Last Dose  . glycopyrrolate (ROBINUL) 1 MG tablet Take 1 tablet (1 mg total) by mouth 3 (three) times daily. 30 tablet 0  02/06/2016 at Unknown time  . hydrocortisone cream 1 % Apply to affected area 2 times daily 15 g 0 Past Month at Unknown time  . ondansetron (ZOFRAN ODT) 8 MG disintegrating tablet Take 1 tablet (8 mg total) by mouth every 8 (eight) hours as needed for nausea or vomiting. 30 tablet 0 Past Week at Unknown time  . promethazine (PHENERGAN) 25 MG tablet Take 1 tablet (25 mg total) by mouth every 8 (eight) hours as needed for nausea or vomiting. 20 tablet 0 Past Week at Unknown time  . ranitidine (ZANTAC) 150 MG tablet Take 1 tablet (150 mg total) by mouth 2 (two) times daily. 60 tablet 0 Past Week at Unknown time   Results for orders placed or performed during the hospital encounter of 02/07/16 (from the past 48 hour(s))  Urinalysis, Routine w reflex microscopic ([facility])     Status: Abnormal   Collection Time: 02/07/16  8:40 AM  Result Value Ref Range   Color, Urine YELLOW YELLOW   APPearance CLOUDY (A) CLEAR   Specific Gravity, Urine >1.030 (H) 1.005 - 1.030   pH 6.0 5.0 - 8.0   Glucose, UA NEGATIVE NEGATIVE mg/dL   Hgb urine dipstick NEGATIVE NEGATIVE   Bilirubin Urine NEGATIVE NEGATIVE   Ketones, ur >  80 (A) NEGATIVE mg/dL   Protein, ur NEGATIVE NEGATIVE mg/dL   Nitrite NEGATIVE NEGATIVE   Leukocytes, UA NEGATIVE NEGATIVE    Comment: MICROSCOPIC NOT DONE ON URINES WITH NEGATIVE PROTEIN, BLOOD, LEUKOCYTES, NITRITE, OR GLUCOSE <1000 mg/dL.  CBC     Status: Abnormal   Collection Time: 02/07/16  9:26 AM  Result Value Ref Range   WBC 8.9 4.0 - 10.5 K/uL   RBC 3.22 (L) 3.87 - 5.11 MIL/uL   Hemoglobin 9.7 (L) 12.0 - 15.0 g/dL   HCT 16.1 (L) 09.6 - 04.5 %   MCV 91.0 78.0 - 100.0 fL   MCH 30.1 26.0 - 34.0 pg   MCHC 33.1 30.0 - 36.0 g/dL   RDW 40.9 81.1 - 91.4 %   Platelets 275 150 - 400 K/uL    Review of Systems  Constitutional: Negative for fever and chills.  Cardiovascular: Negative.   Gastrointestinal: Positive for nausea and vomiting. Negative for heartburn, abdominal pain,  diarrhea and constipation.  Genitourinary: Negative.   Neurological: Negative for headaches.   Physical Exam   Blood pressure 111/66, pulse 71, temperature 97.9 F (36.6 C), resp. rate 18, weight 120 lb (54.432 kg), last menstrual period 10/20/2015, SpO2 99 %.  Physical Exam  Nursing note and vitals reviewed. Constitutional: She is oriented to person, place, and time. She appears well-developed and well-nourished. No distress.  HENT:  Head: Normocephalic and atraumatic.  Eyes: Conjunctivae are normal. Right eye exhibits no discharge. Left eye exhibits no discharge. No scleral icterus.  Neck: Normal range of motion.  Cardiovascular: Normal rate, regular rhythm and normal heart sounds.   No murmur heard. Respiratory: Effort normal and breath sounds normal. No respiratory distress. She has no wheezes.  GI: Soft. Bowel sounds are normal. There is no tenderness.  Neurological: She is alert and oriented to person, place, and time.  Skin: Skin is warm and dry. She is not diaphoretic.  Psychiatric: She has a normal mood and affect. Her behavior is normal. Judgment and thought content normal.    MAU Course  Procedures  Results for orders placed or performed during the hospital encounter of 02/07/16 (from the past 24 hour(s))  Urinalysis, Routine w reflex microscopic (not at [facility])     Status: Abnormal   Collection Time: 02/07/16  8:40 AM  Result Value Ref Range   Color, Urine YELLOW YELLOW   APPearance CLOUDY (A) CLEAR   Specific Gravity, Urine >1.030 (H) 1.005 - 1.030   pH 6.0 5.0 - 8.0   Glucose, UA NEGATIVE NEGATIVE mg/dL   Hgb urine dipstick NEGATIVE NEGATIVE   Bilirubin Urine NEGATIVE NEGATIVE   Ketones, ur >80 (A) NEGATIVE mg/dL   Protein, ur NEGATIVE NEGATIVE mg/dL   Nitrite NEGATIVE NEGATIVE   Leukocytes, UA NEGATIVE NEGATIVE  CBC     Status: Abnormal   Collection Time: 02/07/16  9:26 AM  Result Value Ref Range   WBC 8.9 4.0 - 10.5 K/uL   RBC 3.22 (L) 3.87 - 5.11 MIL/uL    Hemoglobin 9.7 (L) 12.0 - 15.0 g/dL   HCT 78.2 (L) 95.6 - 21.3 %   MCV 91.0 78.0 - 100.0 fL   MCH 30.1 26.0 - 34.0 pg   MCHC 33.1 30.0 - 36.0 g/dL   RDW 08.6 57.8 - 46.9 %   Platelets 275 150 - 400 K/uL  Comprehensive metabolic panel     Status: Abnormal   Collection Time: 02/07/16  9:26 AM  Result Value Ref Range   Sodium 131 (L)  135 - 145 mmol/L   Potassium 3.3 (L) 3.5 - 5.1 mmol/L   Chloride 100 (L) 101 - 111 mmol/L   CO2 21 (L) 22 - 32 mmol/L   Glucose, Bld 242 (H) 65 - 99 mg/dL   BUN 8 6 - 20 mg/dL   Creatinine, Ser 7.82 0.44 - 1.00 mg/dL   Calcium 8.4 (L) 8.9 - 10.3 mg/dL   Total Protein 6.0 (L) 6.5 - 8.1 g/dL   Albumin 2.9 (L) 3.5 - 5.0 g/dL   AST 81 (H) 15 - 41 U/L   ALT 99 (H) 14 - 54 U/L   Alkaline Phosphatase 39 38 - 126 U/L   Total Bilirubin 0.5 0.3 - 1.2 mg/dL   GFR calc non Af Amer >60 >60 mL/min   GFR calc Af Amer >60 >60 mL/min   Anion gap 10 5 - 15   MDM FHT 140 by doppler IV fluids - D5LR x 1 liter Pepcid 20 mg IV Zofran 8 mg IV No vomiting after meds Will send refill for promethazine to correct pharmacy  Assessment and Plan  A:  1. Hyperemesis gravidarum before end of [redacted] week gestation, dehydration     P; Discharge home Rx promethazine Continue other meds previously prescribed HEG diet info  Judeth Horn, NP

## 2016-02-07 NOTE — MAU Note (Signed)
Pt presents to MAU with complaints of nausea and vomiting. Denies any vaginal bleeding or abnormal discharge  

## 2016-02-16 ENCOUNTER — Inpatient Hospital Stay (HOSPITAL_COMMUNITY)
Admission: AD | Admit: 2016-02-16 | Discharge: 2016-02-16 | Discharge status: Against Medical Advice | Payer: Medicaid Other | Source: Ambulatory Visit | Attending: Family Medicine | Admitting: Family Medicine

## 2016-02-16 ENCOUNTER — Telehealth: Payer: Self-pay | Admitting: Family Medicine

## 2016-02-16 DIAGNOSIS — Z5321 Procedure and treatment not carried out due to patient leaving prior to being seen by health care provider: Secondary | ICD-10-CM | POA: Diagnosis not present

## 2016-02-16 DIAGNOSIS — R109 Unspecified abdominal pain: Secondary | ICD-10-CM | POA: Insufficient documentation

## 2016-02-16 LAB — URINALYSIS, ROUTINE W REFLEX MICROSCOPIC
Bilirubin Urine: NEGATIVE
Glucose, UA: NEGATIVE mg/dL
Hgb urine dipstick: NEGATIVE
KETONES UR: NEGATIVE mg/dL
NITRITE: NEGATIVE
PH: 6 (ref 5.0–8.0)
PROTEIN: NEGATIVE mg/dL
Specific Gravity, Urine: 1.025 (ref 1.005–1.030)

## 2016-02-16 LAB — URINE MICROSCOPIC-ADD ON: RBC / HPF: NONE SEEN RBC/hpf (ref 0–5)

## 2016-02-16 NOTE — MAU Note (Signed)
Having real bad cramps in lower stomach.  Started last night. Denies GI or GU  Complaints.

## 2016-02-16 NOTE — MAU Note (Signed)
#  3 not in lobby 

## 2016-02-16 NOTE — MAU Note (Signed)
Not in lobby

## 2016-02-16 NOTE — Telephone Encounter (Signed)
EMERGENCY PHONE LINE Attempted to recontact Mrs. Forbess. States she has been having intermittent cramping in lower abdomen since this morning. Still present. At one time, she was unable to move her legs due to the cramping. Denies loss of fluid, vaginal bleeding, or vaginal discharge. Has not yet noted fetal movement.   [facility] Agrees to go to MAU.  Dr. Caroleen Hammanumley, 02/16/16, 11:51 AM

## 2016-02-16 NOTE — MAU Note (Signed)
#  2  Not in lobby

## 2016-02-16 NOTE — Telephone Encounter (Signed)
EMERGENCY PHONE LINE Attempted to return page to Emergency Phone Line x3 without response. [facility]ing to [facility] and [facility] to continue attempts to contact.  Dr. Caroleen Hammanumley 02/16/16, 8:09 AM

## 2016-02-18 ENCOUNTER — Ambulatory Visit (INDEPENDENT_AMBULATORY_CARE_PROVIDER_SITE_OTHER): Payer: BLUE CROSS/BLUE SHIELD | Admitting: Family Medicine

## 2016-02-18 VITALS — BP 125/72 | HR 98 | Temp 98.4°F | Wt 142.0 lb

## 2016-02-18 DIAGNOSIS — Z3482 Encounter for supervision of other normal pregnancy, second trimester: Secondary | ICD-10-CM

## 2016-02-18 MED ORDER — ONDANSETRON 8 MG PO TBDP: 8.0000 mg | ORAL_TABLET | Freq: Three times a day (TID) | ORAL | Status: DC | PRN

## 2016-02-18 MED ORDER — PROMETHAZINE HCL 25 MG PO TABS: 25.0000 mg | ORAL_TABLET | Freq: Four times a day (QID) | ORAL | Status: DC | PRN

## 2016-02-18 MED FILL — PROMETHAZINE 25 MG TABLET: 25 | 7 days supply | Qty: 30 | Fill #0

## 2016-02-18 MED FILL — ONDANSETRON ODT 8 MG TABLET: 8 | 10 days supply | Qty: 30 | Fill #0

## 2016-02-18 NOTE — Patient Instructions (Addendum)

## 2016-02-18 NOTE — Progress Notes (Signed)
Sharon Barnett is a 25 y.o. G1P0 at 4574w2d for routine follow up.  She reports heartburn, nausea, no bleeding, no contractions, no leaking and vomiting See flow sheet for details.  A/P: Pregnancy at 7874w2d.  Doing well.   Pregnancy issues include: Hyperemesis gravidarum -- patient has been steadily gaining weight over the past few weeks. Anatomy ultrasound scheduled for 03/01/16 Pt  is interested in genetic screening -- she is been informed to follow-up in our office for lab work first thing next week (she stated her understanding of this as well as her understanding that this is time sensitive) Bleeding and pain precautions reviewed. Follow up 4 weeks.  Kathee DeltonIan D Elodia Haviland, MD,MS,  PGY2 02/18/2016 3:36 PM

## 2016-02-20 ENCOUNTER — Encounter (HOSPITAL_COMMUNITY): Payer: Self-pay

## 2016-02-20 ENCOUNTER — Inpatient Hospital Stay (HOSPITAL_COMMUNITY)
Admission: AD | Admit: 2016-02-20 | Discharge: 2016-02-21 | Disposition: A | Discharge status: Home / Self Care | Payer: Medicaid Other | Source: Ambulatory Visit | Attending: Obstetrics and Gynecology | Admitting: Obstetrics and Gynecology

## 2016-02-20 DIAGNOSIS — Z3A17 17 weeks gestation of pregnancy: Secondary | ICD-10-CM | POA: Diagnosis not present

## 2016-02-20 DIAGNOSIS — R109 Unspecified abdominal pain: Secondary | ICD-10-CM | POA: Insufficient documentation

## 2016-02-20 DIAGNOSIS — O211 Hyperemesis gravidarum with metabolic disturbance: Secondary | ICD-10-CM | POA: Diagnosis not present

## 2016-02-20 DIAGNOSIS — O99012 Anemia complicating pregnancy, second trimester: Secondary | ICD-10-CM | POA: Insufficient documentation

## 2016-02-20 DIAGNOSIS — D649 Anemia, unspecified: Secondary | ICD-10-CM | POA: Insufficient documentation

## 2016-02-20 DIAGNOSIS — O210 Mild hyperemesis gravidarum: Secondary | ICD-10-CM | POA: Insufficient documentation

## 2016-02-20 HISTORY — DX: Mild hyperemesis gravidarum: O21.0

## 2016-02-20 LAB — CBC
HEMATOCRIT: 31.1 % — AB (ref 36.0–46.0)
Hemoglobin: 10.2 g/dL — ABNORMAL LOW (ref 12.0–15.0)
MCH: 30.1 pg (ref 26.0–34.0)
MCHC: 32.8 g/dL (ref 30.0–36.0)
MCV: 91.7 fL (ref 78.0–100.0)
Platelets: 251 10*3/uL (ref 150–400)
RBC: 3.39 MIL/uL — AB (ref 3.87–5.11)
RDW: 14.6 % (ref 11.5–15.5)
WBC: 10.1 10*3/uL (ref 4.0–10.5)

## 2016-02-20 LAB — COMPREHENSIVE METABOLIC PANEL
ALT: 77 U/L — AB (ref 14–54)
AST: 37 U/L (ref 15–41)
Albumin: 3.3 g/dL — ABNORMAL LOW (ref 3.5–5.0)
Alkaline Phosphatase: 47 U/L (ref 38–126)
Anion gap: 9 (ref 5–15)
BILIRUBIN TOTAL: 0.7 mg/dL (ref 0.3–1.2)
BUN: 9 mg/dL (ref 6–20)
CALCIUM: 9 mg/dL (ref 8.9–10.3)
CO2: 23 mmol/L (ref 22–32)
CREATININE: 0.49 mg/dL (ref 0.44–1.00)
Chloride: 103 mmol/L (ref 101–111)
GFR calc Af Amer: >60 mL/min (ref >60)
Glucose, Bld: 76 mg/dL (ref 65–99)
Potassium: 3.6 mmol/L (ref 3.5–5.1)
Sodium: 135 mmol/L (ref 135–145)
TOTAL PROTEIN: 6.7 g/dL (ref 6.5–8.1)

## 2016-02-20 LAB — URINALYSIS, ROUTINE W REFLEX MICROSCOPIC
Bilirubin Urine: NEGATIVE
GLUCOSE, UA: NEGATIVE mg/dL
Hgb urine dipstick: NEGATIVE
KETONES UR: 15 mg/dL — AB
Leukocytes, UA: NEGATIVE
Nitrite: NEGATIVE
PROTEIN: NEGATIVE mg/dL
Specific Gravity, Urine: >1.03 — ABNORMAL HIGH (ref 1.005–1.030)
pH: 5.5 (ref 5.0–8.0)

## 2016-02-20 MED ORDER — PROMETHAZINE HCL 25 MG/ML IJ SOLN
12.5000 mg | Freq: Once | INTRAMUSCULAR | Status: AC
  Administered 2016-02-20: 12.5 mg via INTRAVENOUS
  Filled 2016-02-20: qty 1

## 2016-02-20 MED ORDER — SODIUM CHLORIDE 0.9 % IV BOLUS (SEPSIS)
1000.0000 mL | Freq: Once | INTRAVENOUS | Status: AC
  Administered 2016-02-20: 1000 mL via INTRAVENOUS

## 2016-02-20 NOTE — MAU Note (Signed)
Pt reports onset of vomiting yesterday, has previously had hyperemesis and meds had been helping but not now. Also reports headaches off/on.

## 2016-02-20 NOTE — MAU Provider Note (Signed)
History   [id number]   Chief Complaint  Patient presents with  . Hyperemesis Gravidarum    HPI Sharon Barnett is a 25 y.o. female  G1P0000 at [redacted]w[redacted]d IUP here with report of gradually worsening nausea and vomiting.  Also reports abdominal pain on lower abdomen that has worsened over past week. Denies fever. +chills.  No report of vaginal bleeding or leaking of fluid.  Pt has been seen in MAU for similar complaints.  Hepatitis panel and abdominal ultrasound completed on 02/12/16, both normal.      Patient's last menstrual period was 10/20/2015 (approximate).  OB History  Gravida Para Term Preterm AB SAB TAB Ectopic Multiple Living  1 0 0 0 0 0 0 0 0 0     # Outcome Date GA Lbr Len/2nd Weight Sex Delivery Anes PTL Lv  1 Current               Past Medical History  Diagnosis Date  . Boils   . ADHD (attention deficit hyperactivity disorder)   . Complication of anesthesia     Hypersensitive to anesthesia (wisdom teeth)  . Vaginal Pap smear, abnormal   . Hyperemesis gravidarum     Family History  Problem Relation Age of Onset  . Depression Mother   . Diabetes Mother   . Hypertension Mother   . Miscarriages / [place] Mother   . Asthma Sister   . Depression Sister   . Hearing loss Sister   . Birth defects Sister   . Hyperlipidemia Sister   . Hypertension Sister   . Miscarriages / Stillbirths Sister   . Asthma Maternal Grandmother   . Cancer Maternal Grandmother   . Diabetes Maternal Grandmother   . Hypertension Maternal Grandmother   . Stroke Maternal Grandmother   . Vision loss Maternal Grandmother   . Cancer Paternal Grandmother     Social History   Social History  . Marital Status: Single    Spouse Name: N/A  . Number of Children: N/A  . Years of Education: N/A   Social History Main Topics  . Smoking status: Never Smoker   . Smokeless tobacco: Never Used  . Alcohol Use: No  . Drug Use: No  . Sexual Activity: Yes    Birth Control/ Protection: None    Other Topics Concern  . None   Social History Narrative    No Known Allergies  No current facility-administered medications on file prior to encounter.   Current Outpatient Prescriptions on File Prior to Encounter  Medication Sig Dispense Refill  . glycopyrrolate (ROBINUL) 1 MG tablet Take 1 tablet (1 mg total) by mouth 3 (three) times daily. 30 tablet 0  . hydrocortisone cream 1 % Apply to affected area 2 times daily 15 g 0  . ranitidine (ZANTAC) 150 MG tablet Take 1 tablet (150 mg total) by mouth 2 (two) times daily. 60 tablet 0     Review of Systems  Constitutional: Positive for chills. Negative for fever.  Gastrointestinal: Positive for nausea, vomiting and abdominal pain. Negative for diarrhea and constipation.  Genitourinary: Positive for pelvic pain. Negative for dysuria, frequency and vaginal bleeding.  Neurological: Positive for headaches. Negative for dizziness.  All other systems reviewed and are negative.    Physical Exam   Filed Vitals:   02/20/16 2103  BP: 118/73  Pulse: 118  Temp: 98.9 F (37.2 C)  TempSrc: Oral  Resp: 20  Height: 5\' 8"  (1.727 m)  Weight: 138 lb (62.596 kg)  SpO2: 99%  Physical Exam  Constitutional: She is oriented to person, place, and time. She appears well-developed and well-nourished. No distress.  Appears  uncomfortable  HENT:  Head: Normocephalic.  Neck: Normal range of motion. Neck supple.  Cardiovascular: Normal rate, regular rhythm and normal heart sounds.   Respiratory: Effort normal and breath sounds normal. No respiratory distress.  GI: Soft. Bowel sounds are normal. She exhibits no mass. There is tenderness. There is no rebound and no guarding.  Genitourinary: No bleeding in the vagina.  Musculoskeletal: Normal range of motion. She exhibits no edema.  Neurological: She is alert and oriented to person, place, and time.  Skin: Skin is warm and dry.    MAU Course  Procedures 1000 cc NS bolus given w/ 25 mg IV  phenergan  Results for orders placed or performed during the hospital encounter of 02/20/16 (from the past 24 hour(s))  Urinalysis, Routine w reflex microscopic (not at [facility])     Status: Abnormal   Collection Time: 02/20/16  9:12 PM  Result Value Ref Range   Color, Urine YELLOW YELLOW   APPearance CLEAR CLEAR   Specific Gravity, Urine >1.030 (H) 1.005 - 1.030   pH 5.5 5.0 - 8.0   Glucose, UA NEGATIVE NEGATIVE mg/dL   Hgb urine dipstick NEGATIVE NEGATIVE   Bilirubin Urine NEGATIVE NEGATIVE   Ketones, ur 15 (A) NEGATIVE mg/dL   Protein, ur NEGATIVE NEGATIVE mg/dL   Nitrite NEGATIVE NEGATIVE   Leukocytes, UA NEGATIVE NEGATIVE  Amylase     Status: None   Collection Time: 02/20/16  9:58 PM  Result Value Ref Range   Amylase 99 28 - 100 U/L  Lipase, blood     Status: None   Collection Time: 02/20/16  9:58 PM  Result Value Ref Range   Lipase 15 11 - 51 U/L  CBC     Status: Abnormal   Collection Time: 02/20/16 10:04 PM  Result Value Ref Range   WBC 10.1 4.0 - 10.5 K/uL   RBC 3.39 (L) 3.87 - 5.11 MIL/uL   Hemoglobin 10.2 (L) 12.0 - 15.0 g/dL   HCT 82.9 (L) 56.2 - 13.0 %   MCV 91.7 78.0 - 100.0 fL   MCH 30.1 26.0 - 34.0 pg   MCHC 32.8 30.0 - 36.0 g/dL   RDW 86.5 78.4 - 69.6 %   Platelets 251 150 - 400 K/uL  Comprehensive metabolic panel     Status: Abnormal   Collection Time: 02/20/16 10:04 PM  Result Value Ref Range   Sodium 135 135 - 145 mmol/L   Potassium 3.6 3.5 - 5.1 mmol/L   Chloride 103 101 - 111 mmol/L   CO2 23 22 - 32 mmol/L   Glucose, Bld 76 65 - 99 mg/dL   BUN 9 6 - 20 mg/dL   Creatinine, Ser 2.95 0.44 - 1.00 mg/dL   Calcium 9.0 8.9 - 28.4 mg/dL   Total Protein 6.7 6.5 - 8.1 g/dL   Albumin 3.3 (L) 3.5 - 5.0 g/dL   AST 37 15 - 41 U/L   ALT 77 (H) 14 - 54 U/L   Alkaline Phosphatase 47 38 - 126 U/L   Total Bilirubin 0.7 0.3 - 1.2 mg/dL   GFR calc non Af Amer >60 >60 mL/min   GFR calc Af Amer >60 >60 mL/min   Anion gap 9 5 - 15     MDM  0020 Dr. Emelda Fear in  to assess patient > may discharge home with follow-up in the office. 0100 Pt  reports feeling better, decreased nausea and pain  Assessment and Plan  25 y.o. G1P0000 at 6366w4d IUP  Nausea and Vomiting in Pregnancy Anemia in Pregnancy Abdominal Pain  Plan: Discharge home RX Ferrous Sulfate 325 mg  Continue antiemetics Follow-up in office  Sharon Barnett Kennith Gain Karim, CNM 02/21/2016 1:16 AM

## 2016-02-21 ENCOUNTER — Encounter (HOSPITAL_COMMUNITY): Payer: Self-pay

## 2016-02-21 DIAGNOSIS — O211 Hyperemesis gravidarum with metabolic disturbance: Secondary | ICD-10-CM

## 2016-02-21 LAB — LIPASE, BLOOD: LIPASE: 15 U/L (ref 11–51)

## 2016-02-21 LAB — AMYLASE: Amylase: 99 U/L (ref 28–100)

## 2016-02-21 MED ORDER — FERROUS SULFATE 325 (65 FE) MG PO TABS: 325.0000 mg | ORAL_TABLET | Freq: Every day | ORAL | Status: DC

## 2016-02-21 NOTE — Discharge Instructions (Signed)
Hyperemesis Gravidarum  Hyperemesis gravidarum is a severe form of nausea and vomiting that happens during pregnancy. Hyperemesis is worse than morning sickness. It may cause you to have nausea or vomiting all day for many days. It may keep you from eating and drinking enough food and liquids. Hyperemesis usually occurs during the first half (the first 20 weeks) of pregnancy. It often goes away once a woman is in her second half of pregnancy. However, sometimes hyperemesis continues through an entire pregnancy.   CAUSES   The cause of this condition is not completely known but is thought to be related to changes in the body's hormones when pregnant. It could be from the high level of the pregnancy hormone or an increase in estrogen in the body.   SIGNS AND SYMPTOMS    Severe nausea and vomiting.   Nausea that does not go away.   Vomiting that does not allow you to keep any food down.   Weight loss and body fluid loss (dehydration).   Having no desire to eat or not liking food you have previously enjoyed.  DIAGNOSIS   Your health care provider will do a physical exam and ask you about your symptoms. He or she may also order blood tests and urine tests to make sure something else is not causing the problem.   TREATMENT   You may only need medicine to control the problem. If medicines do not control the nausea and vomiting, you will be treated in the hospital to prevent dehydration, increased acid in the blood (acidosis), weight loss, and changes in the electrolytes in your body that may harm the unborn baby (fetus). You may need IV fluids.   HOME CARE INSTRUCTIONS    Only take over-the-counter or prescription medicines as directed by your health care provider.   Try eating a couple of dry crackers or toast in the morning before getting out of bed.   Avoid foods and smells that upset your stomach.   Avoid fatty and spicy foods.   Eat 5-6 small meals a day.   Do not drink when eating meals. Drink between  meals.   For snacks, eat high-protein foods, such as cheese.   Eat or suck on things that have ginger in them. Ginger helps nausea.   Avoid food preparation. The smell of food can spoil your appetite.   Avoid iron pills and iron in your multivitamins until after 3-4 months of being pregnant. However, consult with your health care provider before stopping any prescribed iron pills.  SEEK MEDICAL CARE IF:    Your abdominal pain increases.   You have a severe headache.   You have vision problems.   You are losing weight.  SEEK IMMEDIATE MEDICAL CARE IF:    You are unable to keep fluids down.   You vomit blood.   You have constant nausea and vomiting.   You have excessive weakness.   You have extreme thirst.   You have dizziness or fainting.   You have a fever or persistent symptoms for more than 2-3 days.   You have a fever and your symptoms suddenly get worse.  MAKE SURE YOU:    Understand these instructions.   Will watch your condition.   Will get help right away if you are not doing well or get worse.     This information is not intended to replace advice given to you by your health care provider. Make sure you discuss any questions you have with   your health care provider.     Document Released: 08/21/2005 Document Revised: 06/11/2013 Document Reviewed: 04/02/2013  Elsevier Interactive Patient Education 2016 [employer] Inc[employer]

## 2016-02-23 ENCOUNTER — Encounter (HOSPITAL_COMMUNITY): Payer: Self-pay | Admitting: Family Medicine

## 2016-02-25 ENCOUNTER — Telehealth: Payer: Self-pay | Admitting: Family Medicine

## 2016-02-25 NOTE — Telephone Encounter (Signed)
Forms placed in PCP box. 

## 2016-02-25 NOTE — Telephone Encounter (Signed)
Pt called because she dropped off some forms at the front desk to be filled out by her doctor. They have to be in by 03/01/16. She said that there was no one at the front desk but a sign was up stating that we would be back at 2 pm. She left them on the counter with a note to be filled out. Myriam Jacobsonjw

## 2016-02-29 ENCOUNTER — Encounter (HOSPITAL_COMMUNITY): Payer: Self-pay | Admitting: *Deleted

## 2016-02-29 ENCOUNTER — Inpatient Hospital Stay (EMERGENCY_DEPARTMENT_HOSPITAL)
Admission: AD | Admit: 2016-02-29 | Discharge: 2016-02-29 | Discharge status: Against Medical Advice | Payer: BLUE CROSS/BLUE SHIELD | Source: Ambulatory Visit | Attending: Family Medicine | Admitting: Family Medicine

## 2016-02-29 ENCOUNTER — Inpatient Hospital Stay (HOSPITAL_COMMUNITY)
Admission: AD | Admit: 2016-02-29 | Discharge: 2016-03-01 | Disposition: A | Discharge status: Home / Self Care | Payer: BLUE CROSS/BLUE SHIELD | Source: Ambulatory Visit | Attending: Family Medicine | Admitting: Family Medicine

## 2016-02-29 ENCOUNTER — Encounter (HOSPITAL_COMMUNITY): Payer: Self-pay

## 2016-02-29 DIAGNOSIS — R1030 Lower abdominal pain, unspecified: Secondary | ICD-10-CM

## 2016-02-29 DIAGNOSIS — O99342 Other mental disorders complicating pregnancy, second trimester: Secondary | ICD-10-CM

## 2016-02-29 DIAGNOSIS — O219 Vomiting of pregnancy, unspecified: Secondary | ICD-10-CM

## 2016-02-29 DIAGNOSIS — F909 Attention-deficit hyperactivity disorder, unspecified type: Secondary | ICD-10-CM

## 2016-02-29 DIAGNOSIS — O99340 Other mental disorders complicating pregnancy, unspecified trimester: Secondary | ICD-10-CM | POA: Insufficient documentation

## 2016-02-29 DIAGNOSIS — Z3A18 18 weeks gestation of pregnancy: Secondary | ICD-10-CM

## 2016-02-29 DIAGNOSIS — O210 Mild hyperemesis gravidarum: Secondary | ICD-10-CM | POA: Diagnosis not present

## 2016-02-29 DIAGNOSIS — Z3402 Encounter for supervision of normal first pregnancy, second trimester: Secondary | ICD-10-CM

## 2016-02-29 DIAGNOSIS — O218 Other vomiting complicating pregnancy: Secondary | ICD-10-CM

## 2016-02-29 DIAGNOSIS — O211 Hyperemesis gravidarum with metabolic disturbance: Secondary | ICD-10-CM

## 2016-02-29 DIAGNOSIS — R112 Nausea with vomiting, unspecified: Secondary | ICD-10-CM | POA: Diagnosis present

## 2016-02-29 HISTORY — DX: Headache, unspecified: R51.9

## 2016-02-29 HISTORY — DX: Headache: R51

## 2016-02-29 LAB — URINALYSIS, ROUTINE W REFLEX MICROSCOPIC
BILIRUBIN URINE: NEGATIVE
GLUCOSE, UA: NEGATIVE mg/dL
HGB URINE DIPSTICK: NEGATIVE
Ketones, ur: 15 mg/dL — AB
Nitrite: NEGATIVE
Protein, ur: NEGATIVE mg/dL
SPECIFIC GRAVITY, URINE: 1.025 (ref 1.005–1.030)
pH: 6.5 (ref 5.0–8.0)

## 2016-02-29 LAB — URINE MICROSCOPIC-ADD ON

## 2016-02-29 MED ORDER — LACTATED RINGERS IV SOLN
Freq: Once | INTRAVENOUS | Status: DC
Start: 2016-02-29 — End: 2016-02-29
  Filled 2016-02-29: qty 1000

## 2016-02-29 MED ORDER — FAMOTIDINE IN NACL 20-0.9 MG/50ML-% IV SOLN: 20.0000 mg | Freq: Once | INTRAVENOUS | Status: DC

## 2016-02-29 NOTE — MAU Provider Note (Signed)
History     CSN: [id number]  Arrival date and time: 02/29/16 1831   None     Chief Complaint  Patient presents with  . Abdominal Pain  . Emesis   HPI  Pt is 2365w5d G1P0000 who presetns with nausea and vomiting in pregnancy and lower abdominal cramping. Pt is taking Zofran and phenergan.  Pt admits to constpation- had BM today, which was hard after 4 days of not having BM.  Pt is taking zofran every 8 hours and phenergan every 6 hours. Pt denies spotting or bleeding or UTI sx. Pt states she gets dehydrated and starts throwing up. Pt has a scheduled OB US tomorrow 03/01/2016 with MFM [facility] Pt does not feel like she can tolerate anything PO at this time. Pt has had numerous MAU visits for dehydration- pt last seen on 02/20/2016 RN note: Registered Nurse)     Expand All Collapse All   Keeps throwing up, can't keep nothing down. Having pain in lower stomach         Past Medical History  Diagnosis Date  . Boils   . ADHD (attention deficit hyperactivity disorder)   . Complication of anesthesia     Hypersensitive to anesthesia (wisdom teeth)  . Vaginal Pap smear, abnormal   . Hyperemesis gravidarum   . Headache     Past Surgical History  Procedure Laterality Date  . Wisdom tooth extraction      Family History  Problem Relation Age of Onset  . Depression Mother   . Diabetes Mother   . Hypertension Mother   . Misc[place] / [place] Mother   . Asthma Sister   . Depression Sister   . Hearing loss Sister   . Birth defects Sister   . Hyperlipidemia Sister   . Hypertension Sister   . Miscarriages / Stillbirths Sister   . Asthma Maternal Grandmother   . Cancer Maternal Grandmother   . Diabetes Maternal Grandmother   . Hypertension Maternal Grandmother   . Stroke Maternal Grandmother   . Vision loss Maternal Grandmother   . Cancer Paternal Grandmother     Social History  Substance Use Topics  . Smoking status: Never Smoker   .  Smokeless tobacco: Never Used  . Alcohol Use: No    Allergies: No Known Allergies  Prescriptions prior to admission  Medication Sig Dispense Refill Last Dose  . ferrous sulfate 325 (65 FE) MG tablet Take 1 tablet (325 mg total) by mouth daily. 30 tablet 0   . glycopyrrolate (ROBINUL) 1 MG tablet Take 1 tablet (1 mg total) by mouth 3 (three) times daily. 30 tablet 0 02/06/2016 at Unknown time  . hydrocortisone cream 1 % Apply to affected area 2 times daily 15 g 0 Past Month at Unknown time  . ondansetron (ZOFRAN ODT) 8 MG disintegrating tablet Take 1 tablet (8 mg total) by mouth every 8 (eight) hours as needed for nausea or vomiting. 30 tablet 1   . promethazine (PHENERGAN) 25 MG tablet Take 1 tablet (25 mg total) by mouth every 6 (six) hours as needed for nausea or vomiting. 30 tablet 1   . ranitidine (ZANTAC) 150 MG tablet Take 1 tablet (150 mg total) by mouth 2 (two) times daily. 60 tablet 0 Past Week at Unknown time    Review of Systems  Constitutional: Negative for fever and chills.  Gastrointestinal: Positive for nausea, vomiting, abdominal pain and constipation.  Genitourinary: Negative for dysuria.   Physical  Exam   Blood pressure 120/78, pulse 93, temperature 98.3 F (36.8 C), temperature source Oral, resp. rate 16, weight 140 lb 12 oz (63.844 kg), last menstrual period 10/20/2015. Wt 140lb 12 oz Physical Exam  Nursing note and vitals reviewed. Constitutional: She is oriented to person, place, and time. She appears well-developed and well-nourished. No distress.  HENT:  Head: Normocephalic.  Eyes: Pupils are equal, round, and reactive to light.  Neck: Normal range of motion. Neck supple.  Cardiovascular: Normal rate.   Respiratory: Effort normal.  GI: Soft. She exhibits no distension. There is tenderness. There is no rebound and no guarding.  Superficially tender to touch mid to left lower quadrant  Musculoskeletal: Normal range of motion.  Neurological: She is alert and  oriented to person, place, and time.  Skin: Skin is warm and dry.  Psychiatric: She has a normal mood and affect.    MAU Course  Procedures Results for orders placed or performed during the hospital encounter of 02/29/16 (from the past 24 hour(s))  Urinalysis, Routine w reflex microscopic ([facility])     Status: Abnormal   Collection Time: 02/29/16  6:40 PM  Result Value Ref Range   Color, Urine YELLOW YELLOW   APPearance CLOUDY (A) CLEAR   Specific Gravity, Urine 1.025 1.005 - 1.030   pH 6.5 5.0 - 8.0   Glucose, UA NEGATIVE NEGATIVE mg/dL   Hgb urine dipstick NEGATIVE NEGATIVE   Bilirubin Urine NEGATIVE NEGATIVE   Ketones, ur 15 (A) NEGATIVE mg/dL   Protein, ur NEGATIVE NEGATIVE mg/dL   Nitrite NEGATIVE NEGATIVE   Leukocytes, UA SMALL (A) NEGATIVE  Urine microscopic-add on     Status: Abnormal   Collection Time: 02/29/16  6:40 PM  Result Value Ref Range   Squamous Epithelial / LPF 6-30 (A) NONE SEEN   WBC, UA 6-30 0 - 5 WBC/hpf   RBC / HPF 6-30 0 - 5 RBC/hpf   Bacteria, UA MANY (A) NONE SEEN  IV LR with phenergan 25mg  Pepcid 20mg  IVPB Pt left AMA before treatment Assessment and Plan    Evy Lutterman 02/29/2016, 6:52 PM
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
## 2016-02-29 NOTE — MAU Note (Signed)
Keeps throwing up, can't keep nothing down.  Having pain in lower stomach

## 2016-02-29 NOTE — Telephone Encounter (Signed)
Pt calling again about paperwork. She needs it tomorrow because it is due. Please advise. Deseree Bruna PotterBlount, CMA

## 2016-03-01 ENCOUNTER — Ambulatory Visit (HOSPITAL_COMMUNITY): Payer: BLUE CROSS/BLUE SHIELD

## 2016-03-01 DIAGNOSIS — O210 Mild hyperemesis gravidarum: Secondary | ICD-10-CM

## 2016-03-01 MED ORDER — RANITIDINE HCL 150 MG PO TABS: 150.0000 mg | ORAL_TABLET | Freq: Two times a day (BID) | ORAL | Status: DC

## 2016-03-01 MED ORDER — ONDANSETRON 8 MG PO TBDP
8.0000 mg | ORAL_TABLET | Freq: Once | ORAL | Status: AC
  Administered 2016-03-01: 8 mg via ORAL
  Filled 2016-03-01: qty 1

## 2016-03-01 MED ORDER — PROMETHAZINE HCL 25 MG PO TABS: 25.0000 mg | ORAL_TABLET | Freq: Four times a day (QID) | ORAL | Status: DC | PRN

## 2016-03-01 MED ORDER — PROMETHAZINE HCL 25 MG/ML IJ SOLN
25.0000 mg | Freq: Once | INTRAMUSCULAR | Status: AC
  Administered 2016-03-01: 25 mg via INTRAMUSCULAR
  Filled 2016-03-01: qty 1

## 2016-03-01 MED ORDER — ONDANSETRON 8 MG PO TBDP: 8.0000 mg | ORAL_TABLET | Freq: Three times a day (TID) | ORAL | Status: DC | PRN

## 2016-03-01 MED ORDER — GLYCOPYRROLATE 1 MG PO TABS: 1.0000 mg | ORAL_TABLET | Freq: Three times a day (TID) | ORAL | Status: DC

## 2016-03-01 NOTE — Telephone Encounter (Signed)
Attempted to call pt today phone kept ringing, no answer. Sharon Barnett, CMA

## 2016-03-01 NOTE — Discharge Instructions (Signed)
Hyperemesis Gravidarum  Hyperemesis gravidarum is a severe form of nausea and vomiting that happens during pregnancy. Hyperemesis is worse than morning sickness. It may cause you to have nausea or vomiting all day for many days. It may keep you from eating and drinking enough food and liquids. Hyperemesis usually occurs during the first half (the first 20 weeks) of pregnancy. It often goes away once a woman is in her second half of pregnancy. However, sometimes hyperemesis continues through an entire pregnancy.   CAUSES   The cause of this condition is not completely known but is thought to be related to changes in the body's hormones when pregnant. It could be from the high level of the pregnancy hormone or an increase in estrogen in the body.   SIGNS AND SYMPTOMS    Severe nausea and vomiting.   Nausea that does not go away.   Vomiting that does not allow you to keep any food down.   Weight loss and body fluid loss (dehydration).   Having no desire to eat or not liking food you have previously enjoyed.  DIAGNOSIS   Your health care provider will do a physical exam and ask you about your symptoms. He or she may also order blood tests and urine tests to make sure something else is not causing the problem.   TREATMENT   You may only need medicine to control the problem. If medicines do not control the nausea and vomiting, you will be treated in the hospital to prevent dehydration, increased acid in the blood (acidosis), weight loss, and changes in the electrolytes in your body that may harm the unborn baby (fetus). You may need IV fluids.   HOME CARE INSTRUCTIONS    Only take over-the-counter or prescription medicines as directed by your health care provider.   Try eating a couple of dry crackers or toast in the morning before getting out of bed.   Avoid foods and smells that upset your stomach.   Avoid fatty and spicy foods.   Eat 5-6 small meals a day.   Do not drink when eating meals. Drink between  meals.   For snacks, eat high-protein foods, such as cheese.   Eat or suck on things that have ginger in them. Ginger helps nausea.   Avoid food preparation. The smell of food can spoil your appetite.   Avoid iron pills and iron in your multivitamins until after 3-4 months of being pregnant. However, consult with your health care provider before stopping any prescribed iron pills.  SEEK MEDICAL CARE IF:    Your abdominal pain increases.   You have a severe headache.   You have vision problems.   You are losing weight.  SEEK IMMEDIATE MEDICAL CARE IF:    You are unable to keep fluids down.   You vomit blood.   You have constant nausea and vomiting.   You have excessive weakness.   You have extreme thirst.   You have dizziness or fainting.   You have a fever or persistent symptoms for more than 2-3 days.   You have a fever and your symptoms suddenly get worse.  MAKE SURE YOU:    Understand these instructions.   Will watch your condition.   Will get help right away if you are not doing well or get worse.     This information is not intended to replace advice given to you by your health care provider. Make sure you discuss any questions you have with   your health care provider.     Document Released: 08/21/2005 Document Revised: 06/11/2013 Document Reviewed: 04/02/2013  Elsevier Interactive Patient Education 2016 [employer] Inc[employer]

## 2016-03-01 NOTE — Telephone Encounter (Signed)
Forms complete and left up front for pickup

## 2016-03-01 NOTE — MAU Provider Note (Signed)
History     CSN: [id number]  Arrival date and time: 02/29/16 2213   First Provider Initiated Contact with Patient 03/01/16 0110      No chief complaint on file.  HPI   Sharon Barnett is a 25 y.o. female G1P0000 here with N/V. She has had severe hyperemesis with this pregnancy and has had 14 visits to the MAU for N/V; since her last MAU she has had a 2 lb weight gain since 6/18.  The patient has not taken any of her nausea medication today. She ran out of all of her medicine and did not call them for refills.   Patient was here earlier and left due to wait times.  She denies vaginal bleeding or abdominal pain    OB History    Gravida Para Term Preterm AB TAB SAB Ectopic Multiple Living   1 0 0 0 0 0 0 0 0 0       Past Medical History  Diagnosis Date  . Boils   . ADHD (attention deficit hyperactivity disorder)   . Complication of anesthesia     Hypersensitive to anesthesia (wisdom teeth)  . Vaginal Pap smear, abnormal   . Hyperemesis gravidarum   . Headache     Past Surgical History  Procedure Laterality Date  . Wisdom tooth extraction      Family History  Problem Relation Age of Onset  . Depression Mother   . Diabetes Mother   . Hypertension Mother   . Miscarriages / [place] Mother   . Asthma Sister   . Depression Sister   . Hearing loss Sister   . Birth defects Sister   . Hyperlipidemia Sister   . Hypertension Sister   . Miscarriages / Stillbirths Sister   . Asthma Maternal Grandmother   . Cancer Maternal Grandmother   . Diabetes Maternal Grandmother   . Hypertension Maternal Grandmother   . Stroke Maternal Grandmother   . Vision loss Maternal Grandmother   . Cancer Paternal Grandmother     Social History  Substance Use Topics  . Smoking status: Never Smoker   . Smokeless tobacco: Never Used  . Alcohol Use: No    Allergies: No Known Allergies  Prescriptions prior to admission  Medication Sig Dispense Refill Last Dose  . glycopyrrolate  (ROBINUL) 1 MG tablet Take 1 tablet (1 mg total) by mouth 3 (three) times daily. 30 tablet 0 02/28/2016 at Unknown time  . hydrocortisone cream 1 % Apply to affected area 2 times daily 15 g 0 Past Month at Unknown time  . ondansetron (ZOFRAN ODT) 8 MG disintegrating tablet Take 1 tablet (8 mg total) by mouth every 8 (eight) hours as needed for nausea or vomiting. 30 tablet 1 02/28/2016 at Unknown time  . promethazine (PHENERGAN) 25 MG tablet Take 1 tablet (25 mg total) by mouth every 6 (six) hours as needed for nausea or vomiting. 30 tablet 1 02/28/2016 at Unknown time  . ranitidine (ZANTAC) 150 MG tablet Take 1 tablet (150 mg total) by mouth 2 (two) times daily. 60 tablet 0 02/28/2016 at Unknown time  . ferrous sulfate 325 (65 FE) MG tablet Take 1 tablet (325 mg total) by mouth daily. (Patient not taking: Reported on 02/29/2016) 30 tablet 0 Not Taking at Unknown time   Results for orders placed or performed during the hospital encounter of 02/29/16 (from the past 48 hour(s))  Urinalysis, Routine w reflex microscopic ([facility])     Status: Abnormal   Collection Time:  02/29/16  6:40 PM  Result Value Ref Range   Color, Urine YELLOW YELLOW   APPearance CLOUDY (A) CLEAR   Specific Gravity, Urine 1.025 1.005 - 1.030   pH 6.5 5.0 - 8.0   Glucose, UA NEGATIVE NEGATIVE mg/dL   Hgb urine dipstick NEGATIVE NEGATIVE   Bilirubin Urine NEGATIVE NEGATIVE   Ketones, ur 15 (A) NEGATIVE mg/dL   Protein, ur NEGATIVE NEGATIVE mg/dL   Nitrite NEGATIVE NEGATIVE   Leukocytes, UA SMALL (A) NEGATIVE  Urine microscopic-add on     Status: Abnormal   Collection Time: 02/29/16  6:40 PM  Result Value Ref Range   Squamous Epithelial / LPF 6-30 (A) NONE SEEN   WBC, UA 6-30 0 - 5 WBC/hpf   RBC / HPF 6-30 0 - 5 RBC/hpf   Bacteria, UA MANY (A) NONE SEEN    Review of Systems  Constitutional: Negative for fever and chills.  Gastrointestinal: Positive for nausea and vomiting. Negative for abdominal pain.  Genitourinary:  Negative for dysuria.   Physical Exam   Blood pressure 127/79, pulse 93, temperature 98.2 F (36.8 C), temperature source Oral, resp. rate 18, last menstrual period 10/20/2015, SpO2 100 %.  Physical Exam  Constitutional: She is oriented to person, place, and time. She appears well-developed and well-nourished. No distress.  HENT:  Head: Normocephalic.  Eyes: Pupils are equal, round, and reactive to light.  Respiratory: Effort normal.  Musculoskeletal: Normal range of motion.  Neurological: She is alert and oriented to person, place, and time.  Skin: Skin is warm. She is not diaphoretic.  Psychiatric: Her behavior is normal.    MAU Course  Procedures  None  MDM  + fetal heart tones via doppler.  UA shows minimal dehydration Phenergan 25 mg IM Patient tolerating PO fluids. No vomiting episodes while in MAU Patient requests zofran ODT prior to DC.  Urine culture pending   Assessment and Plan   A:  1. Hyperemesis gravidarum     P:  Discharge home in stable condition Rx: I refilled all of her nausea medications today Patient encouraged to call her OB office in the future for refills.  Return to MAU if symptoms worsen   Duane LopeJennifer I Rasch, NP 03/01/2016 1:24 AM

## 2016-03-02 LAB — URINE CULTURE: Special Requests: NORMAL

## 2016-03-03 NOTE — Telephone Encounter (Signed)
Pt called because the forms that we filled out need to be faxed to the number on the forms. Please fax and let her know when done. jw

## 2016-03-06 ENCOUNTER — Ambulatory Visit (INDEPENDENT_AMBULATORY_CARE_PROVIDER_SITE_OTHER): Payer: BLUE CROSS/BLUE SHIELD | Admitting: Family Medicine

## 2016-03-06 VITALS — BP 128/75 | HR 108 | Temp 98.4°F | Wt 142.0 lb

## 2016-03-06 DIAGNOSIS — O211 Hyperemesis gravidarum with metabolic disturbance: Secondary | ICD-10-CM

## 2016-03-06 DIAGNOSIS — Z3402 Encounter for supervision of normal first pregnancy, second trimester: Secondary | ICD-10-CM

## 2016-03-06 NOTE — Progress Notes (Signed)
Sharon Barnett is a 25 y.o. G1P0000 at 1550w5d for routine follow up.  She reports persistent vomiting (2-3x a day). Constant nausea.  See flow sheet for details.  A/P: Pregnancy at 6750w5d.  Doing well.   Pregnancy issues include hyperemesis grav. Anatomy scan UNABLE TO BE DONE due to social issues. Patient called ahead to cancel. Will be rescheduled today.  Preterm labor precautions reviewed. Follow up 4 weeks.  Kathee DeltonIan D McKeag, MD,MS,  PGY2 03/06/2016 11:47 AM

## 2016-03-06 NOTE — Patient Instructions (Signed)

## 2016-03-06 NOTE — Telephone Encounter (Signed)
Forms faxed to number provided and placed back up front for patient .

## 2016-03-08 ENCOUNTER — Other Ambulatory Visit: Payer: BLUE CROSS/BLUE SHIELD

## 2016-03-09 ENCOUNTER — Inpatient Hospital Stay (HOSPITAL_COMMUNITY)
Admission: AD | Admit: 2016-03-09 | Discharge: 2016-03-09 | Disposition: A | Discharge status: Home / Self Care | Payer: Medicaid Other | Source: Ambulatory Visit | Attending: Obstetrics and Gynecology | Admitting: Obstetrics and Gynecology

## 2016-03-09 ENCOUNTER — Ambulatory Visit (HOSPITAL_COMMUNITY): Payer: BLUE CROSS/BLUE SHIELD

## 2016-03-09 DIAGNOSIS — Z79899 Other long term (current) drug therapy: Secondary | ICD-10-CM | POA: Insufficient documentation

## 2016-03-09 DIAGNOSIS — O99342 Other mental disorders complicating pregnancy, second trimester: Secondary | ICD-10-CM | POA: Insufficient documentation

## 2016-03-09 DIAGNOSIS — F909 Attention-deficit hyperactivity disorder, unspecified type: Secondary | ICD-10-CM | POA: Insufficient documentation

## 2016-03-09 DIAGNOSIS — O219 Vomiting of pregnancy, unspecified: Secondary | ICD-10-CM | POA: Diagnosis not present

## 2016-03-09 DIAGNOSIS — O99282 Endocrine, nutritional and metabolic diseases complicating pregnancy, second trimester: Secondary | ICD-10-CM | POA: Insufficient documentation

## 2016-03-09 DIAGNOSIS — E860 Dehydration: Secondary | ICD-10-CM | POA: Insufficient documentation

## 2016-03-09 DIAGNOSIS — K529 Noninfective gastroenteritis and colitis, unspecified: Secondary | ICD-10-CM | POA: Diagnosis not present

## 2016-03-09 DIAGNOSIS — Z3A20 20 weeks gestation of pregnancy: Secondary | ICD-10-CM | POA: Insufficient documentation

## 2016-03-09 DIAGNOSIS — O210 Mild hyperemesis gravidarum: Secondary | ICD-10-CM

## 2016-03-09 LAB — CBC
HEMATOCRIT: 29.9 % — AB (ref 36.0–46.0)
Hemoglobin: 10 g/dL — ABNORMAL LOW (ref 12.0–15.0)
MCH: 30 pg (ref 26.0–34.0)
MCHC: 33.4 g/dL (ref 30.0–36.0)
MCV: 89.8 fL (ref 78.0–100.0)
Platelets: 283 10*3/uL (ref 150–400)
RBC: 3.33 MIL/uL — ABNORMAL LOW (ref 3.87–5.11)
RDW: 13.6 % (ref 11.5–15.5)
WBC: 9.1 10*3/uL (ref 4.0–10.5)

## 2016-03-09 LAB — BASIC METABOLIC PANEL
Anion gap: 7 (ref 5–15)
BUN: 7 mg/dL (ref 6–20)
CHLORIDE: 105 mmol/L (ref 101–111)
CO2: 23 mmol/L (ref 22–32)
Calcium: 8.6 mg/dL — ABNORMAL LOW (ref 8.9–10.3)
Creatinine, Ser: 0.49 mg/dL (ref 0.44–1.00)
GFR calc Af Amer: >60 mL/min (ref >60)
GFR calc non Af Amer: >60 mL/min (ref >60)
GLUCOSE: 74 mg/dL (ref 65–99)
POTASSIUM: 3.4 mmol/L — AB (ref 3.5–5.1)
Sodium: 135 mmol/L (ref 135–145)

## 2016-03-09 MED ORDER — GLYCOPYRROLATE 1 MG PO TABS: 1.0000 mg | ORAL_TABLET | Freq: Three times a day (TID) | ORAL | Status: DC

## 2016-03-09 MED ORDER — PROMETHAZINE HCL 25 MG PO TABS: 25.0000 mg | ORAL_TABLET | Freq: Four times a day (QID) | ORAL | Status: DC | PRN

## 2016-03-09 MED ORDER — RANITIDINE HCL 150 MG PO TABS: 150.0000 mg | ORAL_TABLET | Freq: Two times a day (BID) | ORAL | Status: DC

## 2016-03-09 MED ORDER — SODIUM CHLORIDE 0.9 % IV SOLN
INTRAVENOUS | Status: DC
  Administered 2016-03-09: 15:00:00 via INTRAVENOUS

## 2016-03-09 MED ORDER — PROMETHAZINE HCL 25 MG/ML IJ SOLN
25.0000 mg | Freq: Once | INTRAMUSCULAR | Status: AC
  Administered 2016-03-09: 25 mg via INTRAVENOUS
  Filled 2016-03-09: qty 1

## 2016-03-09 MED ORDER — DEXTROSE IN LACTATED RINGERS 5 % IV SOLN
Freq: Once | INTRAVENOUS | Status: AC
  Administered 2016-03-09: 12:00:00 via INTRAVENOUS

## 2016-03-09 MED ORDER — ONDANSETRON 8 MG PO TBDP: 8.0000 mg | ORAL_TABLET | Freq: Three times a day (TID) | ORAL | Status: DC | PRN

## 2016-03-09 NOTE — Progress Notes (Signed)
Pt requesting apple juice and graham crackers. Pt states that she is still unable to urinate. 1000cc NS up running wide open per order. Will continue to monitor. [name] L Dre Gamino

## 2016-03-09 NOTE — MAU Note (Addendum)
Pt reports she has been having n/v/d since Tuesday. Not eaten or drank anything since. Tried to take some phenergan but unable to keep it down.

## 2016-03-09 NOTE — MAU Provider Note (Signed)
Chief Complaint:  Emesis   First Provider Initiated Contact with Patient 03/09/16 1151      HPI: Sharon Barnett is a 25 y.o. G1P0000 at 13w1dho presents to maternity admissions reporting nausea and diarrhea since 03/07/16.  Thinks it may be related to food as other family members are sick also.  No fever. She reports good fetal movement, denies LOF, vaginal bleeding, vaginal itching/burning, urinary symptoms, h/a, dizziness,  constipation or fever/chills.  She denies headache, visual changes Everyone that was at function has been sick with vomiting and diarrhea. Was [place] [facility] [place] but I had them reschedule it due to gastroenteritis.   Emesis  This is a recurrent problem. The current episode started yesterday. The problem occurs 5 to 10 times per day. The problem has been unchanged. There has been no fever. Associated symptoms include abdominal pain and diarrhea. Pertinent negatives include no chills, coughing, dizziness, fever, headaches or myalgias. Risk factors include suspect food intake. She has tried nothing for the symptoms.  RN Note: Pt states that she has had nausea and diarrhea since 7/4. Pt states that other family members have also been sick with the same symptoms. Pt is unsure if it is food related from the holiday. TToya Smothers RN          Past Medical History: Past Medical History  Diagnosis Date  . Boils   . ADHD (attention deficit hyperactivity disorder)   . Complication of anesthesia     Hypersensitive to anesthesia (wisdom teeth)  . Vaginal Pap smear, abnormal   . Hyperemesis gravidarum   . Headache     Past obstetric history: OB History  Gravida Para Term Preterm AB SAB TAB Ectopic Multiple Living  '1 0 0 0 0 0 0 0 0 0 '$    # Outcome Date GA Lbr Len/2nd Weight Sex Delivery Anes PTL Lv  1 Current               Past Surgical History: Past Surgical History  Procedure Laterality Date  . Wisdom tooth extraction      Family  History: Family History  Problem Relation Age of Onset  . Depression Mother   . Diabetes Mother   . Hypertension Mother   . Miscarriages / [place]   . Asthma Sister   . Depression Sister   . Hearing loss Sister   . Birth defects Sister   . Hyperlipidemia Sister   . Hypertension Sister   . Miscarriages / Stillbirths Sister   . Asthma Maternal Grandmother   . Cancer Maternal Grandmother   . Diabetes Maternal Grandmother   . Hypertension Maternal Grandmother   . Stroke Maternal Grandmother   . Vision loss Maternal Grandmother   . Cancer Paternal Grandmother     Social History: Social History  Substance Use Topics  . Smoking status: Never Smoker   . Smokeless tobacco: Never Used  . Alcohol Use: No    Allergies: No Known Allergies  Meds:  Prescriptions prior to admission  Medication Sig Dispense Refill Last Dose  . glycopyrrolate (ROBINUL) 1 MG tablet Take 1 tablet (1 mg total) by mouth 3 (three) times daily. 30 tablet 1 03/08/2016 at Unknown time  . ondansetron (ZOFRAN ODT) 8 MG disintegrating tablet Take 1 tablet (8 mg total) by mouth every 8 (eight) hours as needed for nausea or vomiting. 30 tablet 1 Past Week at Unknown time  . Prenatal Vit-Fe Fumarate-FA (PRENATAL MULTIVITAMIN) TABS tablet Take 1 tablet by mouth  daily at 12 noon.   Past Week at Unknown time  . promethazine (PHENERGAN) 25 MG tablet Take 1 tablet (25 mg total) by mouth every 6 (six) hours as needed for nausea or vomiting. 30 tablet 1 03/08/2016 at Unknown time  . ranitidine (ZANTAC) 150 MG tablet Take 1 tablet (150 mg total) by mouth 2 (two) times daily. 60 tablet 1 03/08/2016 at Unknown time  . ferrous sulfate 325 (65 FE) MG tablet Take 1 tablet (325 mg total) by mouth daily. (Patient not taking: Reported on 02/29/2016) 30 tablet 0 Not Taking at Unknown time  . hydrocortisone cream 1 % Apply to affected area 2 times daily (Patient not taking: Reported on 03/09/2016) 15 g 0 Not Taking at Unknown time    I  have reviewed patient's Past Medical Hx, Surgical Hx, Family Hx, Social Hx, medications and allergies.   ROS:  Review of Systems  Constitutional: Negative for fever and chills.  Respiratory: Negative for cough.   Gastrointestinal: Positive for vomiting, abdominal pain and diarrhea.  Musculoskeletal: Negative for myalgias.  Neurological: Negative for dizziness and headaches.   Other systems negative  Physical Exam  Patient Vitals for the past 24 hrs:  BP Temp Pulse Resp Height Weight  03/09/16 0955 113/64 mmHg 98.2 F (36.8 C) 96 18 '5\' 8"'$  (1.727 m) 140 lb 1.9 oz (63.558 kg)   Constitutional: Well-developed, female in no acute distress. Appears to have malaise Cardiovascular: Normal  Rhythm, mild tachycardia Respiratory: normal effort, clear to auscultation bilaterally GI: Abd soft, non-tender, gravid appropriate for gestational age.   No rebound or guarding. MS: Extremities nontender, no edema, normal ROM Neurologic: Alert and oriented x 4.  GU: Neg CVAT.  FHR 150  Labs: Results for JNYAH, BRAZEE (MRN [id number]) as of 03/12/2016 17:48  Ref. Range 03/09/2016 11:20  Sodium Latest Ref Range: 135-145 mmol/L 135  Potassium Latest Ref Range: 3.5-5.1 mmol/L 3.4 (L)  Chloride Latest Ref Range: 101-111 mmol/L 105  CO2 Latest Ref Range: 22-32 mmol/L 23  BUN Latest Ref Range: 6-20 mg/dL 7  Creatinine Latest Ref Range: 0.44-1.00 mg/dL 0.49  Calcium Latest Ref Range: 8.9-10.3 mg/dL 8.6 (L)  EGFR (Non-African Amer.) Latest Ref Range: >60 mL/min >60  EGFR (African American) Latest Ref Range: >60 mL/min >60  Glucose Latest Ref Range: 65-99 mg/dL 74  Anion gap Latest Ref Range: 5-15  7  Results for SAYA, MCCOLL (MRN [id number]) as of 03/12/2016 17:48  Ref. Range 03/09/2016 11:20  WBC Latest Ref Range: 4.0-10.5 K/uL 9.1  RBC Latest Ref Range: 3.87-5.11 MIL/uL 3.33 (L)  Hemoglobin Latest Ref Range: 12.0-15.0 g/dL 10.0 (L)  HCT Latest Ref Range: 36.0-46.0 % 29.9 (L)  MCV Latest Ref Range:  78.0-100.0 fL 89.8  MCH Latest Ref Range: 26.0-34.0 pg 30.0  MCHC Latest Ref Range: 30.0-36.0 g/dL 33.4  RDW Latest Ref Range: 11.5-15.5 % 13.6  Platelets Latest Ref Range: 150-400 K/uL 283  Results for TIERRE, GERARD (MRN [id number]) as of 03/12/2016 17:48  Ref. Range 02/29/2016 18:40  Appearance Latest Ref Range: CLEAR  CLOUDY (A)  Bacteria, UA Latest Ref Range: NONE SEEN  MANY (A)  Bilirubin Urine Latest Ref Range: NEGATIVE  NEGATIVE  Color, Urine Latest Ref Range: YELLOW  YELLOW  Glucose Latest Ref Range: NEGATIVE mg/dL NEGATIVE  Hgb urine dipstick Latest Ref Range: NEGATIVE  NEGATIVE  Ketones, ur Latest Ref Range: NEGATIVE mg/dL 15 (A)  Leukocytes, UA Latest Ref Range: NEGATIVE  SMALL (A)  Nitrite Latest Ref Range: NEGATIVE  NEGATIVE  pH Latest Ref Range: 5.0-8.0  6.5  Protein Latest Ref Range: NEGATIVE mg/dL NEGATIVE  RBC / HPF Latest Ref Range: 0-5 RBC/hpf 6-30  Specific Gravity, Urine Latest Ref Range: 1.005-1.030  1.025  Squamous Epithelial / LPF Latest Ref Range: NONE SEEN  6-30 (A)  WBC, UA Latest Ref Range: 0-5 WBC/hpf 6-30   A/POS/-- (03/22 1007)  Imaging:  No results found.  MAU Course/MDM: I have ordered labs and reviewed results.  NST reviewed Consult Dr Elly Modena with presentation, exam findings and test results.  Treatments in MAU included IV hydration x several liters with antiemetic.  Was able to void afterward and stated she felt better.    Assessment: SIUP @ 21w1dGastroenteritis with mild dehydration  Plan: Discharge home Has Zofran and Phenergan at home Advance diet as tolerated Follow up in Office for prenatal visits and recheck    Medication List    ASK your doctor about these medications        ferrous sulfate 325 (65 FE) MG tablet  Take 1 tablet (325 mg total) by mouth daily.     glycopyrrolate 1 MG tablet  Commonly known as:  ROBINUL  Take 1 tablet (1 mg total) by mouth 3 (three) times daily.     hydrocortisone cream 1 %  Apply to  affected area 2 times daily     ondansetron 8 MG disintegrating tablet  Commonly known as:  ZOFRAN ODT  Take 1 tablet (8 mg total) by mouth every 8 (eight) hours as needed for nausea or vomiting.     prenatal multivitamin Tabs tablet  Take 1 tablet by mouth daily at 12 noon.     promethazine 25 MG tablet  Commonly known as:  PHENERGAN  Take 1 tablet (25 mg total) by mouth every 6 (six) hours as needed for nausea or vomiting.     ranitidine 150 MG tablet  Commonly known as:  ZANTAC  Take 1 tablet (150 mg total) by mouth 2 (two) times daily.       Pt stable at time of discharge. Encouraged to return here or to other Urgent Care/ED if she develops worsening of symptoms, increase in pain, fever, or other concerning symptoms.     MHansel FeinsteinCNM, MSN Certified Nurse-Midwife 03/09/2016 11:51 AM

## 2016-03-09 NOTE — Discharge Instructions (Signed)
Eating Plan for Hyperemesis Gravidarum Severe cases of hyperemesis gravidarum can lead to dehydration and malnutrition. The hyperemesis eating plan is one way to lessen the symptoms of nausea and vomiting. It is often used with prescribed medicines to control your symptoms.  WHAT CAN I DO TO RELIEVE MY SYMPTOMS? Listen to your body. Everyone is different and has different preferences. Find what works best for you. Some of the following things may help:  Eat and drink slowly.  Eat 5-6 small meals daily instead of 3 large meals.   Eat crackers before you get out of bed in the morning.   Starchy foods are usually well tolerated (such as cereal, toast, bread, potatoes, pasta, rice, and pretzels).   Ginger may help with nausea. Add  tsp ground ginger to hot tea or choose ginger tea.   Try drinking 100% fruit juice or an electrolyte drink.  Continue to take your prenatal vitamins as directed by your health care provider. If you are having trouble taking your prenatal vitamins, talk with your health care provider about different options.  Include at least 1 serving of protein with your meals and snacks (such as meats or poultry, beans, nuts, eggs, or yogurt). Try eating a protein-rich snack before bed (such as cheese and crackers or a half [place]urkey or peanut butter sandwich). WHAT THINGS SHOULD I AVOID TO REDUCE MY SYMPTOMS? The following things may help reduce your symptoms:  Avoid foods with strong smells. Try eating meals in well-ventilated areas that are free of odors.  Avoid drinking water or other beverages with meals. Try not to drink anything less than 30 minutes before and after meals.  Avoid drinking more than 1 cup of fluid at a time.  Avoid fried or high-fat foods, such as butter and cream sauces.  Avoid spicy foods.  Avoid skipping meals the best you can. Nausea can be more intense on an empty stomach. If you cannot tolerate food at that time, do not force it. Try sucking on  ice chips or other frozen items and make up the calories later.  Avoid lying down within 2 hours after eating.   This information is not intended to replace advice given to you by your health care provider. Make sure you discuss any questions you have with your health care provider.   Document Released: 06/18/2007 Document Revised: 08/26/2013 Document Reviewed: 06/25/2013 Elsevier Interactive Patient Education 2016 Elsevier Inc. Diarrhea Diarrhea is frequent loose and watery bowel movements. It can cause you to feel weak and dehydrated. Dehydration can cause you to become tired and thirsty, have a dry mouth, and have decreased urination that often is dark yellow. Diarrhea is a sign of another problem, most often an infection that will not last long. In most cases, diarrhea typically lasts 2-3 days. However, it can last longer if it is a sign of something more serious. It is important to treat your diarrhea as directed by your caregiver to lessen or prevent future episodes of diarrhea. CAUSES  Some common causes include:  Gastrointestinal infections caused by viruses, bacteria, or parasites.  Food poisoning or food allergies.  Certain medicines, such as antibiotics, chemotherapy, and laxatives.  Artificial sweeteners and fructose.  Digestive disorders. HOME CARE INSTRUCTIONS  Ensure adequate fluid intake (hydration): Have 1 cup (8 oz) of fluid for each diarrhea episode. Avoid fluids that contain simple sugars or sports drinks, fruit juices, whole milk products, and sodas. Your urine should be clear or pale yellow if you are drinking enough fluids.  Hydrate with an oral rehydration solution that you can purchase at pharmacies, retail stores, and online. You can prepare an oral rehydration solution at home by mixing the following ingredients together:   - tsp table salt.   tsp baking soda.   tsp salt substitute containing potassium chloride.  1  tablespoons sugar.  1 L (34 oz) of  water.  Certain foods and beverages may increase the speed at which food moves through the gastrointestinal (GI) tract. These foods and beverages should be avoided and include:  Caffeinated and alcoholic beverages.  High-fiber foods, such as raw fruits and vegetables, nuts, seeds, and whole grain breads and cereals.  Foods and beverages sweetened with sugar alcohols, such as xylitol, sorbitol, and mannitol.  Some foods may be well tolerated and may help thicken stool including:  Starchy foods, such as rice, toast, pasta, low-sugar cereal, oatmeal, grits, baked potatoes, crackers, and bagels.  Bananas.  Applesauce.  Add probiotic-rich foods to help increase healthy bacteria in the GI tract, such as yogurt and fermented milk products.  Wash your hands well after each diarrhea episode.  Only take over-the-counter or prescription medicines as directed by your caregiver.  Take a warm bath to relieve any burning or pain from frequent diarrhea episodes. SEEK IMMEDIATE MEDICAL CARE IF:   You are unable to keep fluids down.  You have persistent vomiting.  You have blood in your stool, or your stools are black and tarry.  You do not urinate in 6-8 hours, or there is only a small amount of very dark urine.  You have abdominal pain that increases or localizes.  You have weakness, dizziness, confusion, or light-headedness.  You have a severe headache.  Your diarrhea gets worse or does not get better.  You have a fever or persistent symptoms for more than 2-3 days.  You have a fever and your symptoms suddenly get worse. MAKE SURE YOU:   Understand these instructions.  Will watch your condition.  Will get help right away if you are not doing well or get worse.   This information is not intended to replace advice given to you by your health care provider. Make sure you discuss any questions you have with your health care provider.   Document Released: 08/11/2002 Document  Revised: 09/11/2014 Document Reviewed: 04/28/2012 Elsevier Interactive Patient Education [employer]2016 Elsevier Inc.

## 2016-03-09 NOTE — MAU Note (Signed)
Pt states that she has had nausea and diarrhea since 7/4. Pt states that other family members have also been sick with the same symptoms. Pt is unsure if it is food related from the holiday. Carmelina DaneERRI L Milt Coye, RN

## 2016-03-10 NOTE — Telephone Encounter (Signed)
Vernona RiegerLaura with [employer] stated question #5 needs beginning and ending dates and an initial for the correction.  This is for the form that was faced on 03-06-16

## 2016-03-14 ENCOUNTER — Ambulatory Visit (HOSPITAL_COMMUNITY)
Admit: 2016-03-14 | Discharge: 2016-03-14 | Disposition: A | Discharge status: Home / Self Care | Payer: Medicaid Other | Attending: Family Medicine | Admitting: Family Medicine

## 2016-03-14 ENCOUNTER — Telehealth: Payer: Self-pay | Admitting: Family Medicine

## 2016-03-14 DIAGNOSIS — Z3402 Encounter for supervision of normal first pregnancy, second trimester: Secondary | ICD-10-CM

## 2016-03-14 DIAGNOSIS — O210 Mild hyperemesis gravidarum: Secondary | ICD-10-CM | POA: Insufficient documentation

## 2016-03-14 DIAGNOSIS — Z3A20 20 weeks gestation of pregnancy: Secondary | ICD-10-CM | POA: Diagnosis not present

## 2016-03-14 DIAGNOSIS — Z36 Encounter for antenatal screening of mother: Secondary | ICD-10-CM | POA: Diagnosis not present

## 2016-03-14 NOTE — Telephone Encounter (Signed)
Patient states the question # 5 on leave paperwork was not completed. It needs to have a Start and End date and Dr. Wende MottMcKeag must initial the correction. Please fax back asap.

## 2016-03-14 NOTE — Telephone Encounter (Signed)
Forms placed back in MD box for correction.

## 2016-03-15 NOTE — Telephone Encounter (Signed)
Form was placed back in MD box yesterday (see previous phone note)

## 2016-03-15 NOTE — Telephone Encounter (Signed)
Pt is calling to check the status of her FMLA paperwork that needs to be corrected. Please let patient know what the status is . Sharon Jacobsonjw

## 2016-03-15 NOTE — Telephone Encounter (Signed)
Ah, Now I remember. Can someone please ask when patient plans on going on maternity leave?? I need to know when she plans on stopping work prior to delivery, and how long she is taking off after delivery.  Once I have this information the forms will be completed and faxed. Thank you!

## 2016-03-16 NOTE — Telephone Encounter (Signed)
Patient informed that FMLA forms completed and faxed as requested.  Forms copied for scanning in patient's record.  Clovis PuMartin, Tamika L, RN

## 2016-03-16 NOTE — Telephone Encounter (Signed)
Spoke with pt and she is starting maternity leave on April 4th and she is taking off 6 weeks. Cayson Kalb Bruna PotterBlount, CMA

## 2016-03-16 NOTE — Telephone Encounter (Signed)
Forms completed and given to staff (Tamika) to help properly send out.

## 2016-03-17 ENCOUNTER — Telehealth: Payer: Self-pay | Admitting: *Deleted

## 2016-03-17 NOTE — Telephone Encounter (Signed)
Pt was in [place] and she went to the hospital because she had abdominal cramping. They gave her 4 different medications for bacterial and vaginal infection. Was told to let her dr know because it could make her go into early labor. Pt wants to speak to dr. Please advise. Sharon Barnett Sharon Barnett, CMA

## 2016-03-18 NOTE — Telephone Encounter (Signed)
Patient should make an appointment to discuss these medications. Next time this happens please ask what these medications are, as they are not listed in her chart.

## 2016-03-21 NOTE — Telephone Encounter (Signed)
Pt informed. Shaune Westfall, CMA  

## 2016-04-05 ENCOUNTER — Encounter: Payer: BLUE CROSS/BLUE SHIELD | Admitting: Family Medicine

## 2016-06-07 ENCOUNTER — Inpatient Hospital Stay (HOSPITAL_COMMUNITY)
Admission: AD | Admit: 2016-06-07 | Discharge: 2016-06-07 | Disposition: A | Discharge status: Home / Self Care | Payer: Medicaid Other | Source: Ambulatory Visit | Attending: Obstetrics and Gynecology | Admitting: Obstetrics and Gynecology

## 2016-06-07 ENCOUNTER — Encounter (HOSPITAL_COMMUNITY): Payer: Self-pay | Admitting: Student

## 2016-06-07 DIAGNOSIS — O212 Late vomiting of pregnancy: Secondary | ICD-10-CM | POA: Diagnosis present

## 2016-06-07 DIAGNOSIS — O99613 Diseases of the digestive system complicating pregnancy, third trimester: Secondary | ICD-10-CM | POA: Diagnosis not present

## 2016-06-07 DIAGNOSIS — K529 Noninfective gastroenteritis and colitis, unspecified: Secondary | ICD-10-CM

## 2016-06-07 DIAGNOSIS — O4703 False labor before 37 completed weeks of gestation, third trimester: Secondary | ICD-10-CM | POA: Diagnosis not present

## 2016-06-07 DIAGNOSIS — Z3A33 33 weeks gestation of pregnancy: Secondary | ICD-10-CM

## 2016-06-07 LAB — COMPREHENSIVE METABOLIC PANEL
ALT: 10 U/L — AB (ref 14–54)
AST: 14 U/L — ABNORMAL LOW (ref 15–41)
Albumin: 2.9 g/dL — ABNORMAL LOW (ref 3.5–5.0)
Alkaline Phosphatase: 110 U/L (ref 38–126)
Anion gap: 9 (ref 5–15)
BUN: 7 mg/dL (ref 6–20)
CHLORIDE: 106 mmol/L (ref 101–111)
CO2: 22 mmol/L (ref 22–32)
CREATININE: 0.53 mg/dL (ref 0.44–1.00)
Calcium: 8.8 mg/dL — ABNORMAL LOW (ref 8.9–10.3)
Glucose, Bld: 71 mg/dL (ref 65–99)
POTASSIUM: 3.5 mmol/L (ref 3.5–5.1)
SODIUM: 137 mmol/L (ref 135–145)
Total Bilirubin: 0.7 mg/dL (ref 0.3–1.2)
Total Protein: 6.6 g/dL (ref 6.5–8.1)

## 2016-06-07 LAB — CBC
HCT: 31.7 % — ABNORMAL LOW (ref 36.0–46.0)
Hemoglobin: 10.4 g/dL — ABNORMAL LOW (ref 12.0–15.0)
MCH: 28.8 pg (ref 26.0–34.0)
MCHC: 32.8 g/dL (ref 30.0–36.0)
MCV: 87.8 fL (ref 78.0–100.0)
PLATELETS: 263 10*3/uL (ref 150–400)
RBC: 3.61 MIL/uL — ABNORMAL LOW (ref 3.87–5.11)
RDW: 13.2 % (ref 11.5–15.5)
WBC: 9.3 10*3/uL (ref 4.0–10.5)

## 2016-06-07 LAB — URINE MICROSCOPIC-ADD ON: RBC / HPF: NONE SEEN RBC/hpf (ref 0–5)

## 2016-06-07 LAB — URINALYSIS, ROUTINE W REFLEX MICROSCOPIC
BILIRUBIN URINE: NEGATIVE
GLUCOSE, UA: NEGATIVE mg/dL
HGB URINE DIPSTICK: NEGATIVE
KETONES UR: 40 mg/dL — AB
Nitrite: NEGATIVE
PH: 8 (ref 5.0–8.0)
PROTEIN: 30 mg/dL — AB
Specific Gravity, Urine: 1.02 (ref 1.005–1.030)

## 2016-06-07 LAB — FETAL FIBRONECTIN: FETAL FIBRONECTIN: NEGATIVE

## 2016-06-07 MED ORDER — LACTATED RINGERS IV BOLUS (SEPSIS)
1000.0000 mL | Freq: Once | INTRAVENOUS | Status: AC
  Administered 2016-06-07: 1000 mL via INTRAVENOUS

## 2016-06-07 MED ORDER — ACETAMINOPHEN 500 MG PO TABS
1000.0000 mg | ORAL_TABLET | Freq: Once | ORAL | Status: AC
  Administered 2016-06-07: 1000 mg via ORAL
  Filled 2016-06-07: qty 2

## 2016-06-07 MED ORDER — FAMOTIDINE IN NACL 20-0.9 MG/50ML-% IV SOLN
20.0000 mg | Freq: Once | INTRAVENOUS | Status: AC
  Administered 2016-06-07: 20 mg via INTRAVENOUS
  Filled 2016-06-07: qty 50

## 2016-06-07 MED ORDER — ONDANSETRON HCL 4 MG/2ML IJ SOLN
4.0000 mg | Freq: Once | INTRAMUSCULAR | Status: AC
  Administered 2016-06-07: 4 mg via INTRAVENOUS
  Filled 2016-06-07: qty 2

## 2016-06-07 MED ORDER — NIFEDIPINE ER OSMOTIC RELEASE 30 MG PO TB24: 30.0000 mg | ORAL_TABLET | Freq: Every day | ORAL | 0 refills | Status: DC

## 2016-06-07 MED ORDER — ONDANSETRON 4 MG PO TBDP: 4.0000 mg | ORAL_TABLET | Freq: Three times a day (TID) | ORAL | 0 refills | Status: DC | PRN

## 2016-06-07 MED ORDER — NIFEDIPINE 10 MG PO CAPS
10.0000 mg | ORAL_CAPSULE | ORAL | Status: DC | PRN
  Administered 2016-06-07 (×3): 10 mg via ORAL
  Filled 2016-06-07 (×3): qty 1

## 2016-06-07 MED ORDER — PROMETHAZINE HCL 12.5 MG PO TABS: 12.5000 mg | ORAL_TABLET | Freq: Four times a day (QID) | ORAL | 0 refills | Status: DC | PRN

## 2016-06-07 NOTE — MAU Provider Note (Signed)
History     CSN: [id number]  Arrival date and time: 06/07/16 1546   First Provider Initiated Contact with Patient 06/07/16 1811      Chief Complaint  Patient presents with  . Emesis During Pregnancy   HPI  Sharon Barnett is a 25 y.o. G1P0000 at [redacted]w[redacted]d who presents with n/v & abdominal pain. Symptoms began 2 days ago. Pt initially was receiving prenatal care at [facility] but transferred cared when she moved to [place]. Pt visiting family in [place] this week and left antiemetics at home in [place]. Has vomited 4 times today and been unable to keep down food or fluids. Also reports 2 episode of watery stool. Denies sick contacts or fever. N/v associated with heartburn.  Abdominal pain started earlier today. Pain comes & goes and occurs with abdominal tightening. Rates pain 9/10. Has not treated. Denies vaginal bleeding or LOF. Positive fetal movement.   OB History    Gravida Para Term Preterm AB Living   1 0 0 0 0 0   SAB TAB Ectopic Multiple Live Births   0 0 0 0        Past Medical History:  Diagnosis Date  . ADHD (attention deficit hyperactivity disorder)   . Boils   . Complication of anesthesia    Hypersensitive to anesthesia (wisdom teeth)  . Headache   . Hyperemesis gravidarum   . Vaginal Pap smear, abnormal     Past Surgical History:  Procedure Laterality Date  . WISDOM TOOTH EXTRACTION      Family History  Problem Relation Age of Onset  . Depression Mother   . Diabetes Mother   . Hypertension Mother   . Miscarriages / [place] Mother   . Asthma Sister   . Depression Sister   . Hearing loss Sister   . Birth defects Sister   . Hyperlipidemia Sister   . Hypertension Sister   . Miscarriages / Stillbirths Sister   . Asthma Maternal Grandmother   . Cancer Maternal Grandmother   . Diabetes Maternal Grandmother   . Hypertension Maternal Grandmother   . Stroke Maternal Grandmother   . Vision loss Maternal Grandmother   . Cancer Paternal Grandmother      Social History  Substance Use Topics  . Smoking status: Never Smoker  . Smokeless tobacco: Never Used  . Alcohol use No    Allergies: No Known Allergies  Prescriptions Prior to Admission  Medication Sig Dispense Refill Last Dose  . acetaminophen (TYLENOL) 325 MG tablet Take 325 mg by mouth every 6 (six) hours as needed for moderate pain.   Past Week at Unknown time  . Prenatal Vit-Fe Fumarate-FA (PRENATAL MULTIVITAMIN) TABS tablet Take 1 tablet by mouth daily at 12 noon.   Past Month at Unknown time  . promethazine (PHENERGAN) 25 MG tablet Take 1 tablet (25 mg total) by mouth every 6 (six) hours as needed for nausea or vomiting. 30 tablet 1 Past Week at Unknown time  . ferrous sulfate 325 (65 FE) MG tablet Take 1 tablet (325 mg total) by mouth daily. (Patient not taking: Reported on 02/29/2016) 30 tablet 0 Not Taking at Unknown time  . glycopyrrolate (ROBINUL) 1 MG tablet Take 1 tablet (1 mg total) by mouth 3 (three) times daily. (Patient not taking: Reported on 06/07/2016) 30 tablet 1 Not Taking at Unknown time  . hydrocortisone cream 1 % Apply to affected area 2 times daily (Patient not taking: Reported on 03/09/2016) 15 g 0 Not Taking at Unknown time  .  ondansetron (ZOFRAN ODT) 8 MG disintegrating tablet Take 1 tablet (8 mg total) by mouth every 8 (eight) hours as needed for nausea or vomiting. (Patient not taking: Reported on 06/07/2016) 30 tablet 1 Not Taking at Unknown time  . ranitidine (ZANTAC) 150 MG tablet Take 1 tablet (150 mg total) by mouth 2 (two) times daily. (Patient not taking: Reported on 06/07/2016) 60 tablet 1 Not Taking at Unknown time    Review of Systems  Constitutional: Negative for chills and fever.  Gastrointestinal: Positive for abdominal pain, diarrhea, heartburn, nausea and vomiting. Negative for blood in stool and constipation.  Genitourinary: Negative.   Musculoskeletal: Positive for back pain.   Physical Exam   Blood pressure 119/67, pulse 70, temperature  98.3 F (36.8 C), temperature source Oral, resp. rate 18, height 5\' 8"  (1.727 m), weight 165 lb (74.8 kg), last menstrual period 10/20/2015.  Physical Exam  Nursing note and vitals reviewed. Constitutional: She is oriented to person, place, and time. She appears well-developed and well-nourished. No distress.  HENT:  Head: Normocephalic and atraumatic.  Eyes: Conjunctivae are normal. Right eye exhibits no discharge. Left eye exhibits no discharge. No scleral icterus.  Neck: Normal range of motion.  Cardiovascular: Normal rate, regular rhythm and normal heart sounds.   No murmur heard. Respiratory: Effort normal and breath sounds normal. No respiratory distress. She has no wheezes.  GI: Soft. There is no tenderness.  Ctx palpate moderate with adequate rest between  Neurological: She is alert and oriented to person, place, and time.  Skin: Skin is warm and dry. She is not diaphoretic.  Psychiatric: She has a normal mood and affect. Her behavior is normal. Judgment and thought content normal.   Fetal Tracing:  Baseline: 135 Variability: moderate Accelerations: 15x15 Decelerations: none  Toco: every 1.5-3.5 mins, 30-100 sec  Dilation: 1 Effacement (%): Thick Cervical Position: Anterior Station: -3 Exam by:: Estanislado Spire NP  MAU Course  Procedures Results for orders placed or performed during the hospital encounter of 06/07/16 (from the past 24 hour(s))  Urinalysis, Routine w reflex microscopic (not at [facility])     Status: Abnormal   Collection Time: 06/07/16  5:20 PM  Result Value Ref Range   Color, Urine YELLOW YELLOW   APPearance HAZY (A) CLEAR   Specific Gravity, Urine 1.020 1.005 - 1.030   pH 8.0 5.0 - 8.0   Glucose, UA NEGATIVE NEGATIVE mg/dL   Hgb urine dipstick NEGATIVE NEGATIVE   Bilirubin Urine NEGATIVE NEGATIVE   Ketones, ur 40 (A) NEGATIVE mg/dL   Protein, ur 30 (A) NEGATIVE mg/dL   Nitrite NEGATIVE NEGATIVE   Leukocytes, UA TRACE (A) NEGATIVE  Urine  microscopic-add on     Status: Abnormal   Collection Time: 06/07/16  5:20 PM  Result Value Ref Range   Squamous Epithelial / LPF 6-30 (A) NONE SEEN   WBC, UA 0-5 0 - 5 WBC/hpf   RBC / HPF NONE SEEN 0 - 5 RBC/hpf   Bacteria, UA FEW (A) NONE SEEN   Urine-Other MUCOUS PRESENT   CBC     Status: Abnormal   Collection Time: 06/07/16  6:00 PM  Result Value Ref Range   WBC 9.3 4.0 - 10.5 K/uL   RBC 3.61 (L) 3.87 - 5.11 MIL/uL   Hemoglobin 10.4 (L) 12.0 - 15.0 g/dL   HCT 16.1 (L) 09.6 - 04.5 %   MCV 87.8 78.0 - 100.0 fL   MCH 28.8 26.0 - 34.0 pg   MCHC 32.8 30.0 - 36.0  g/dL   RDW 16.113.2 09.611.5 - 04.515.5 %   Platelets 263 150 - 400 K/uL  Comprehensive metabolic panel     Status: Abnormal   Collection Time: 06/07/16  6:00 PM  Result Value Ref Range   Sodium 137 135 - 145 mmol/L   Potassium 3.5 3.5 - 5.1 mmol/L   Chloride 106 101 - 111 mmol/L   CO2 22 22 - 32 mmol/L   Glucose, Bld 71 65 - 99 mg/dL   BUN 7 6 - 20 mg/dL   Creatinine, Ser 4.090.53 0.44 - 1.00 mg/dL   Calcium 8.8 (L) 8.9 - 10.3 mg/dL   Total Protein 6.6 6.5 - 8.1 g/dL   Albumin 2.9 (L) 3.5 - 5.0 g/dL   AST 14 (L) 15 - 41 U/L   ALT 10 (L) 14 - 54 U/L   Alkaline Phosphatase 110 38 - 126 U/L   Total Bilirubin 0.7 0.3 - 1.2 mg/dL   GFR calc non Af Amer >60 >60 mL/min   GFR calc Af Amer >60 >60 mL/min   Anion gap 9 5 - 15  Fetal fibronectin     Status: None   Collection Time: 06/07/16  6:10 PM  Result Value Ref Range   Fetal Fibronectin NEGATIVE NEGATIVE    MDM Reactive fetal tracing IV LR bolus, zofran 4 mg IV, pepcid 20 mg IV Procardia 10 mg every 20 mins x 3 doses Cervix unchanged while in MAU Pt reports improvement in symptoms although continues to contract on TOCO FFN negative S/w Dr. Emelda FearFerguson -- as cervix is unchanged & FFN is negative, ok to discharge home w/rx for procardia and pt to f/u with [facility] on Friday Assessment and Plan  A: 1. Preterm uterine contractions in third trimester, antepartum   2.  Gastroenteritis    P: Discharge home Rx phenergan, zofran, & procardia xl 30 mg Call ob tomorrow to discuss follow up appt Preterm labor precautions  Judeth Hornrin Kate Larock 06/07/2016, 6:13 PM

## 2016-06-07 NOTE — MAU Note (Signed)
Pt states she had a nose bleed on Saturday with blood clots. Then on Sunday she had some blood in the toilet. Pt states she was unsure if the blood came from her vagina or rectum. Pt states she started vomiting two days ago non-stop and hasn't been able to keep anything down. Pt states she hasn't had any energy and can't get out of the bed. Pt states she has been loosing her mucous plug. Pt states the baby is moving normally.

## 2016-06-07 NOTE — Discharge Instructions (Signed)

## 2016-06-08 LAB — CULTURE, OB URINE

## 2016-06-17 IMAGING — US US OB COMP LESS 14 WK
1 series · 15 of 28 positions shown · non-contrast
Comparison: 12/01/2015

CLINICAL DATA: Pregnant, negative fetal heart tones

EXAM:
OBSTETRIC <14 WK ULTRASOUND
TECHNIQUE: Transabdominal ultrasound was performed for evaluation of the
gestation as well as the maternal uterus and adnexal regions.

[Series 1: us ob comp less 14 wk · 15 of 38 slices shown]
[im 1/38]
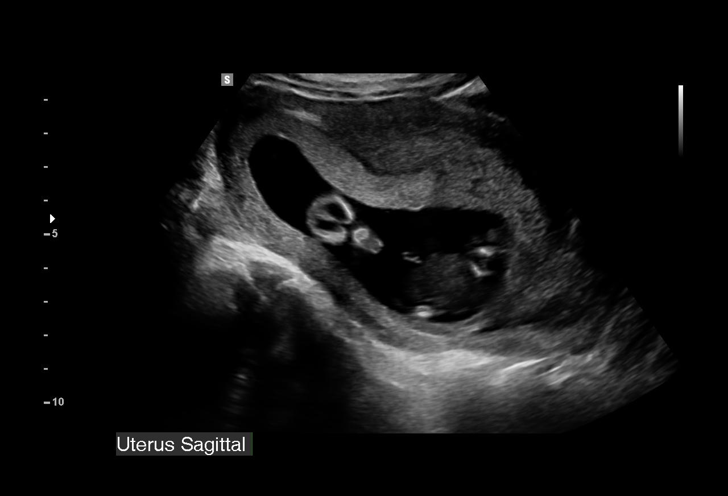
[im 3/38]
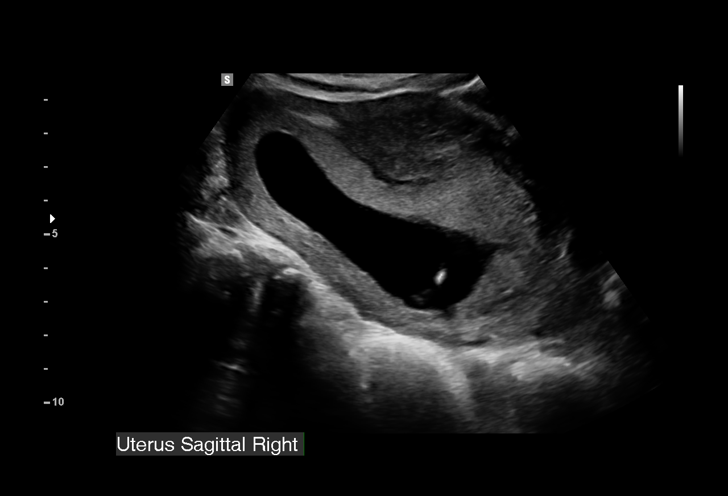
[im 6/38]
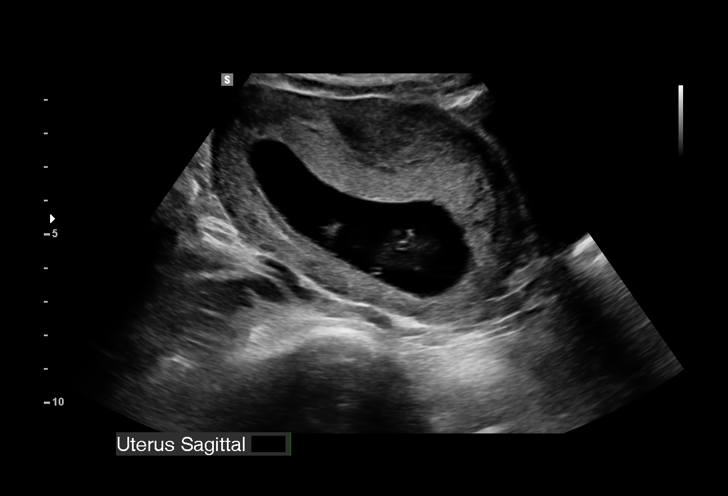
[im 9/38]
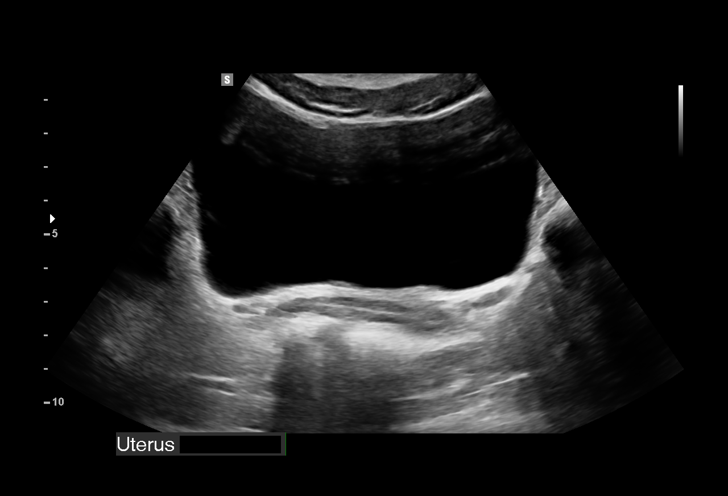
[im 11/38]
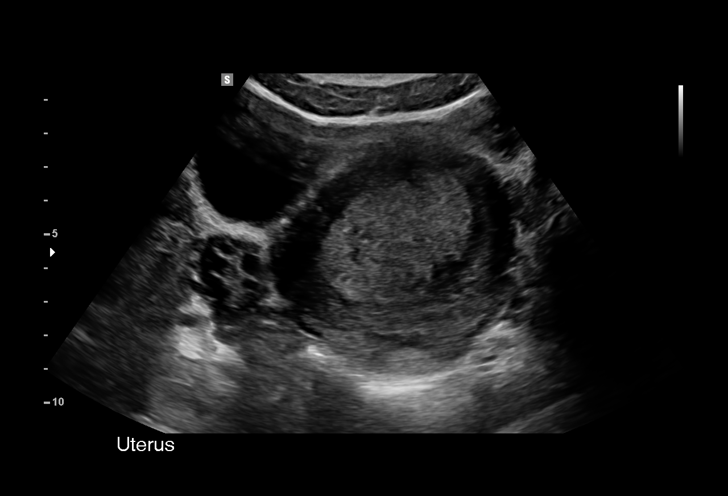
[im 14/38]
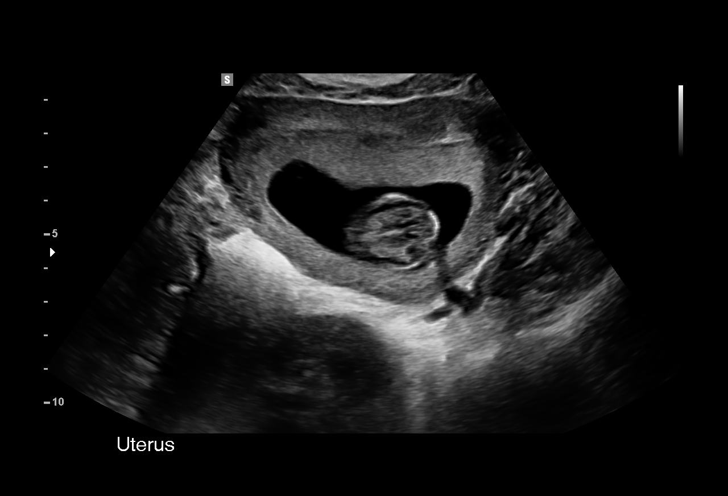
[im 17/38]
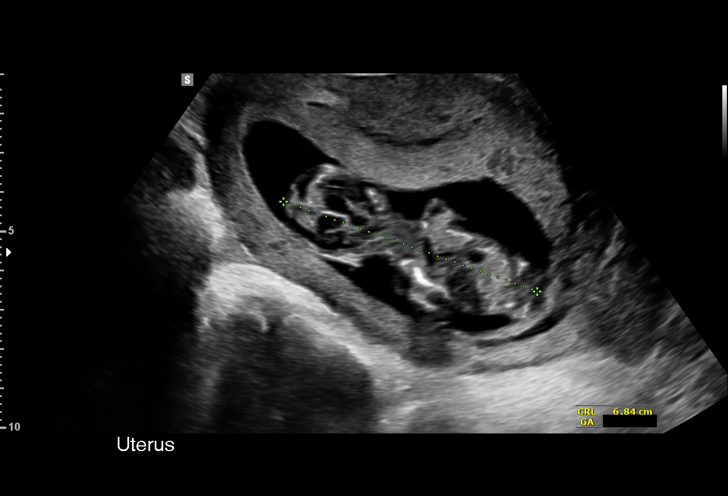
[im 20/38]
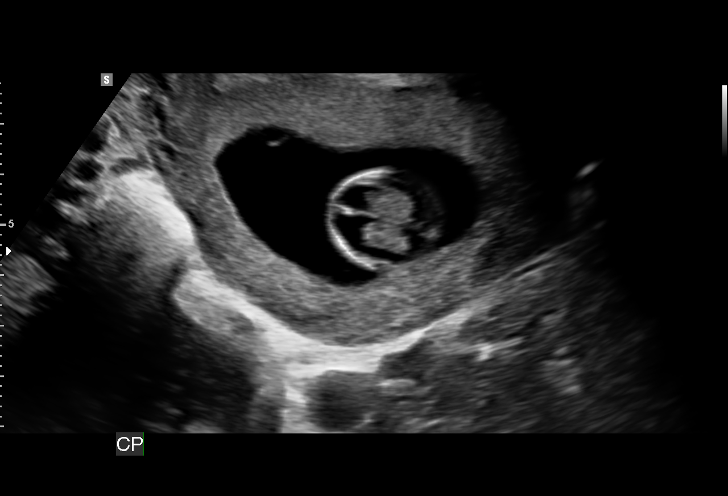
[im 21/38]
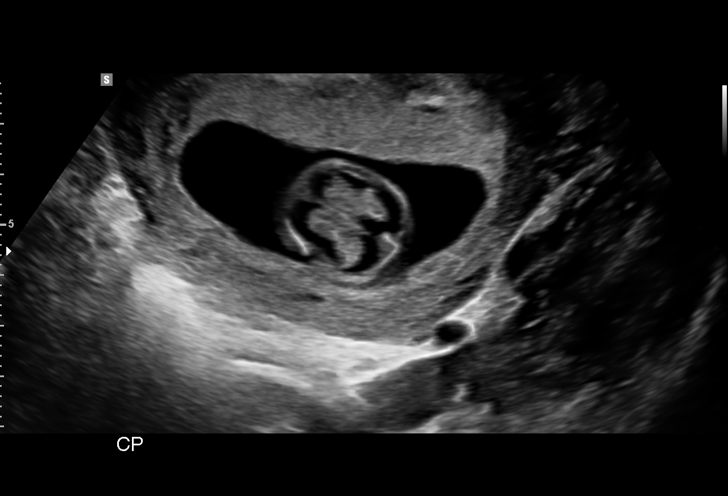
[im 24/38]
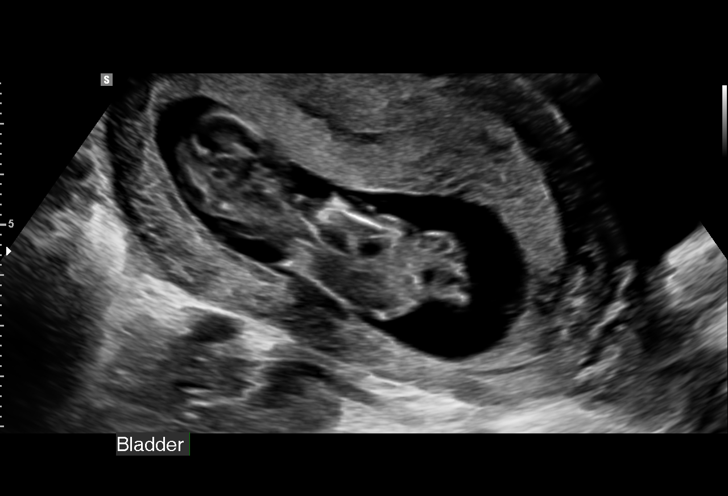
[im 27/38]
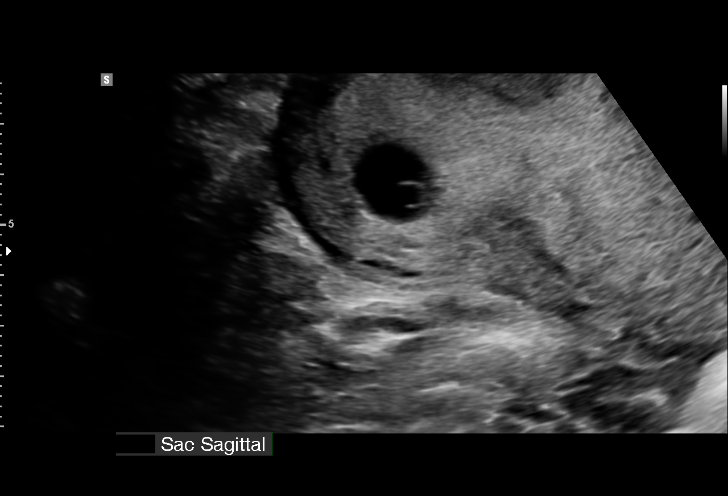
[im 29/38]
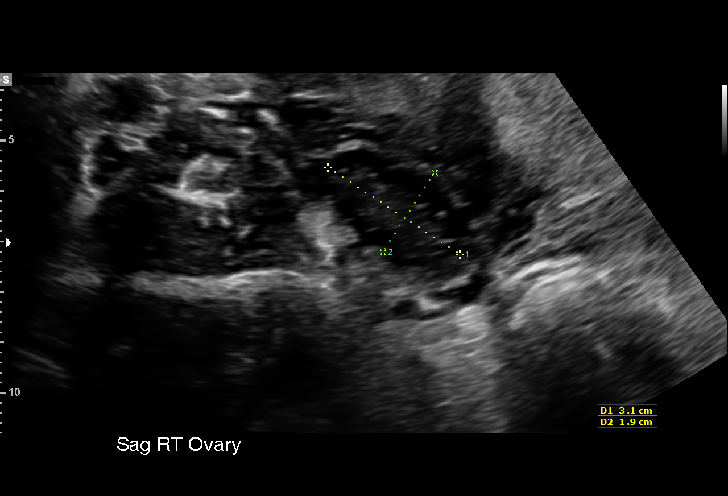
[im 32/38]
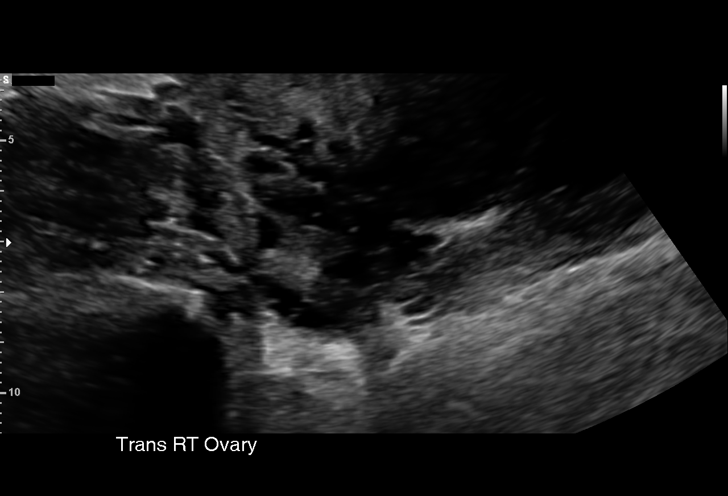
[im 35/38]
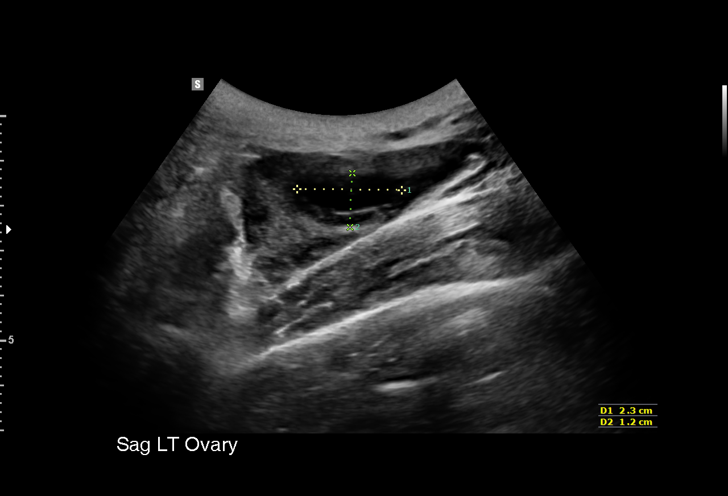
[im 38/38]
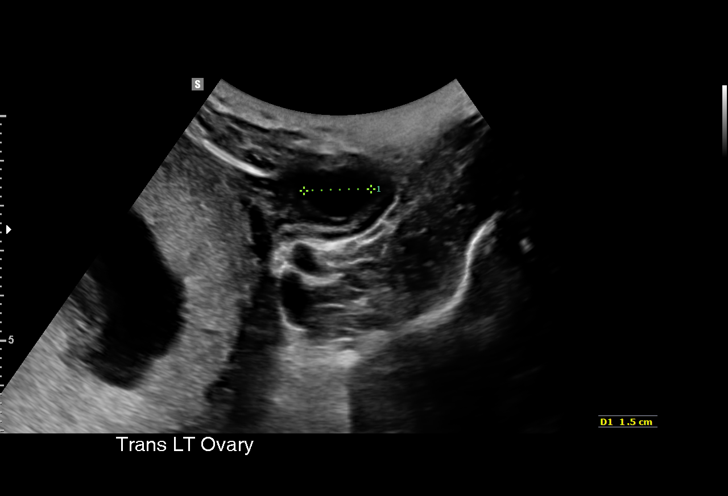

[15 of 28 positions shown; findings below may reference images not displayed]

FINDINGS: Intrauterine gestational sac: Single

Yolk sac:  Present

Embryo:  Present

Cardiac Activity: Present

Heart Rate: 153 bpm

CRL:   66.3  mm   12 w 6 d                  US EDC: 07/20/2016

Subchorionic hemorrhage:  None visualized.

Maternal uterus/adnexae: Bilateral ovaries are within normal limits,
noting a left corpus luteal cyst.

No pelvic fluid.
IMPRESSION: Single live intrauterine gestation with estimated gestational age 12
weeks 6 days by crown-rump length.

Fetal heart rate 153 beats per minute.

## 2016-06-17 IMAGING — US US ABDOMEN COMPLETE
1 series · 15 of 25 positions shown · non-contrast
Comparison: Ob ultrasound today reported separately.

CLINICAL DATA: 24-year-old female with vomiting, abnormal liver
enzymes and leukocytosis. Currently pregnant. Initial encounter.

EXAM:
ABDOMEN ULTRASOUND COMPLETE

[Series 1: us abdomen complete · 15 of 86 slices shown]
[im 1/86]
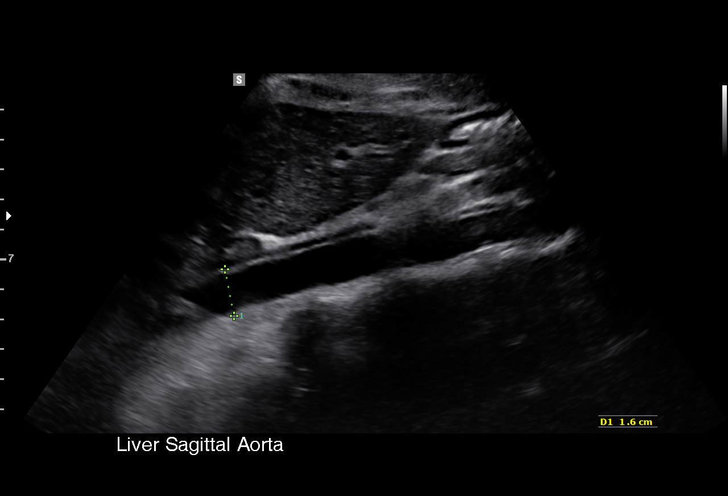
[im 8/86]
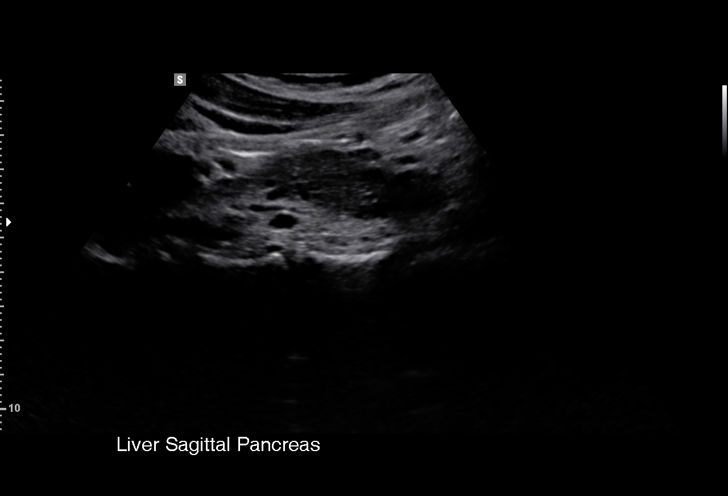
[im 15/86]
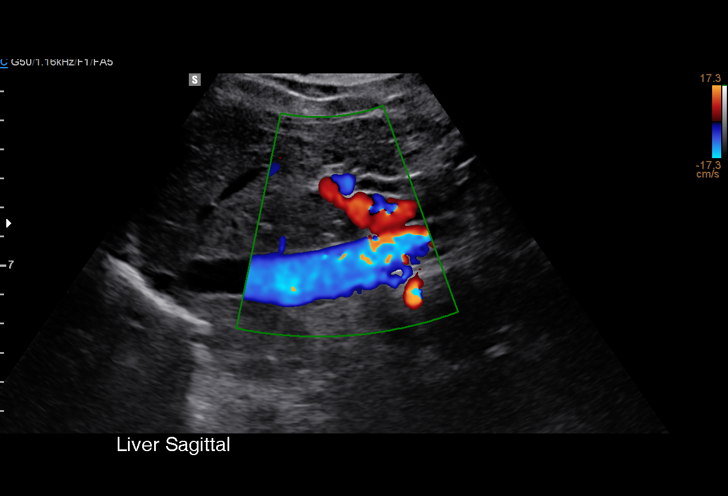
[im 18/86]
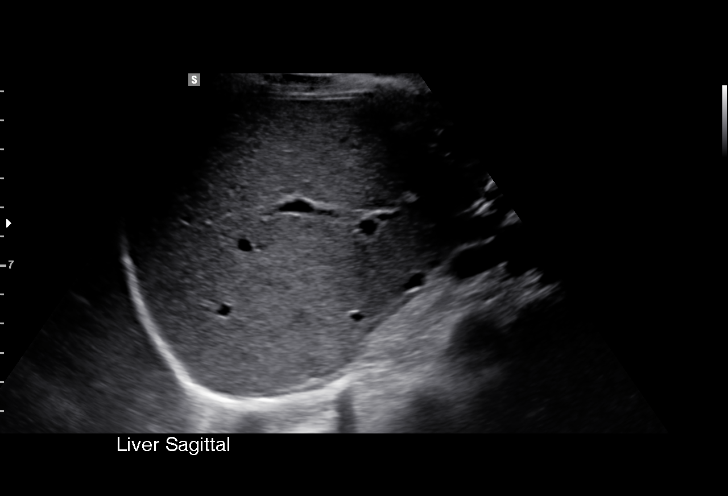
[im 25/86]
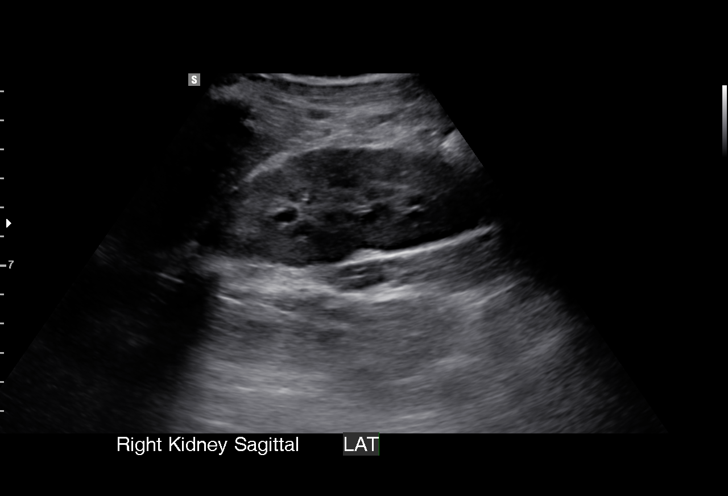
[im 32/86]
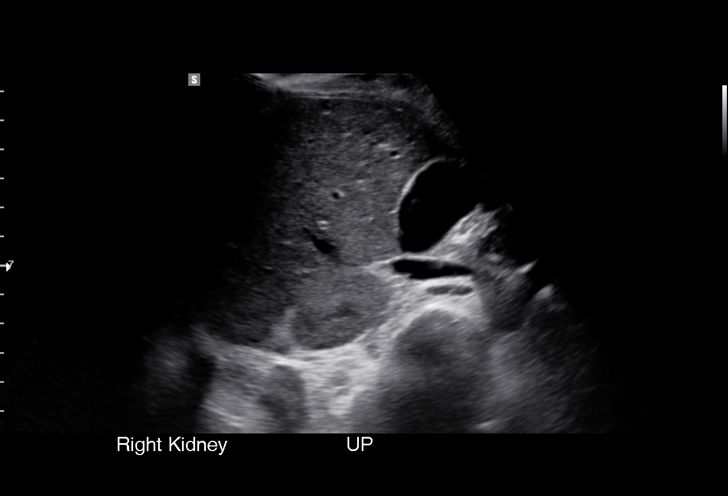
[im 36/86]
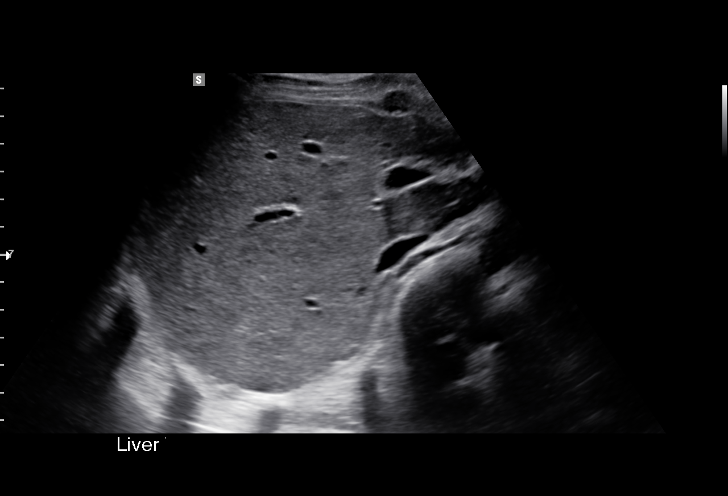
[im 43/86]
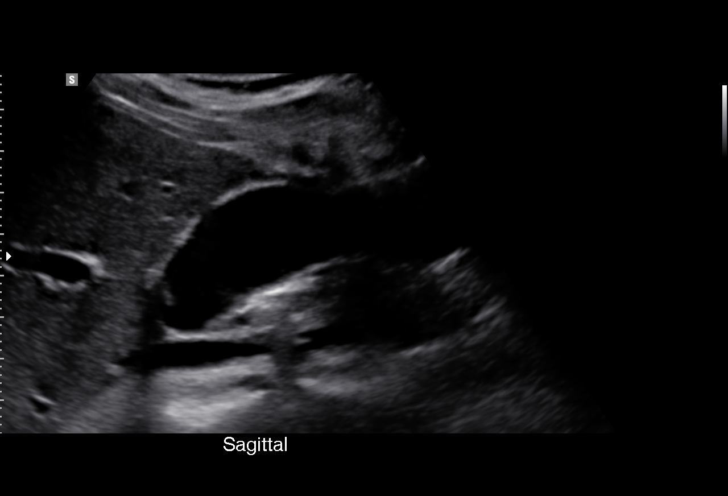
[im 50/86]
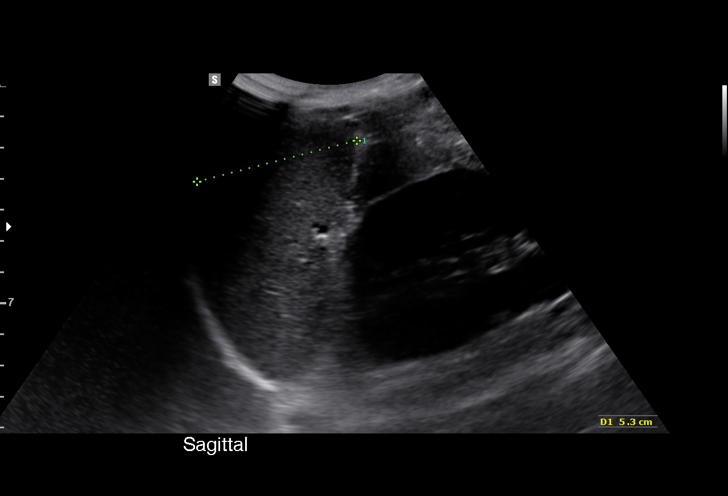
[im 54/86]
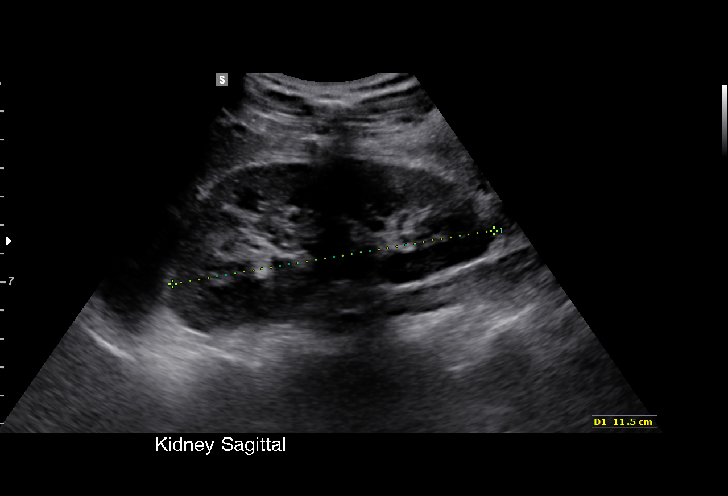
[im 61/86]
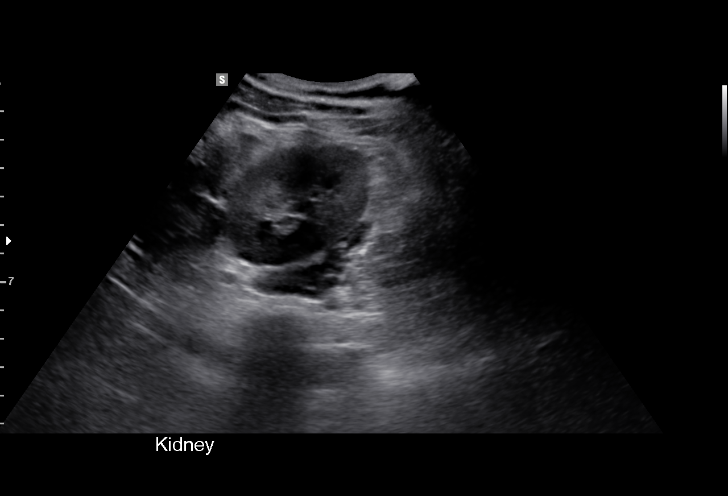
[im 68/86]
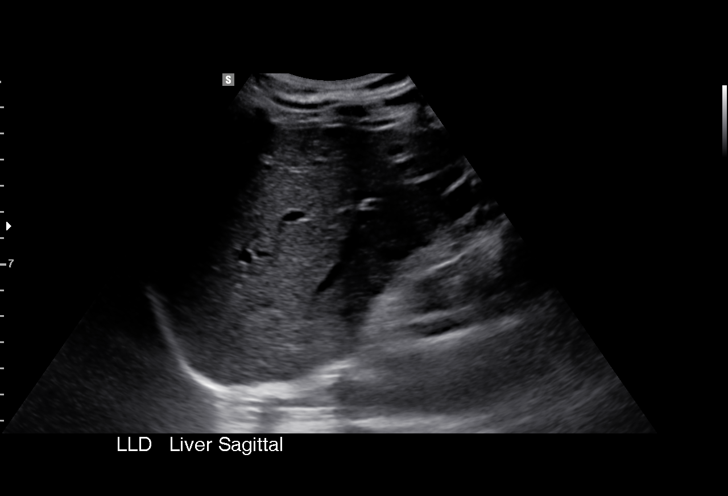
[im 71/86]
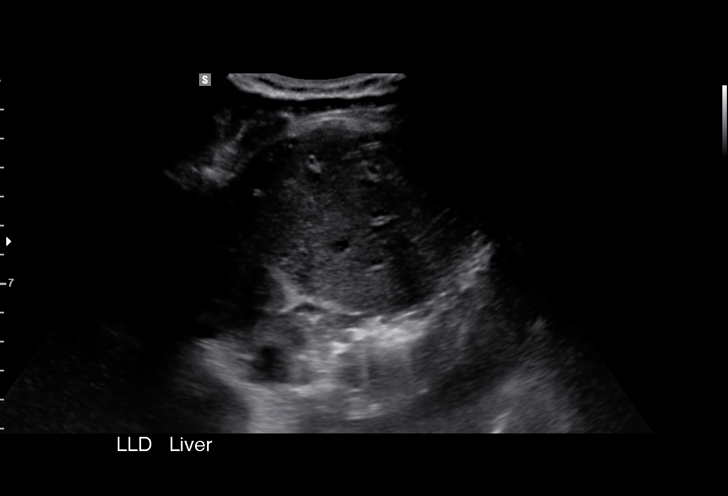
[im 78/86]
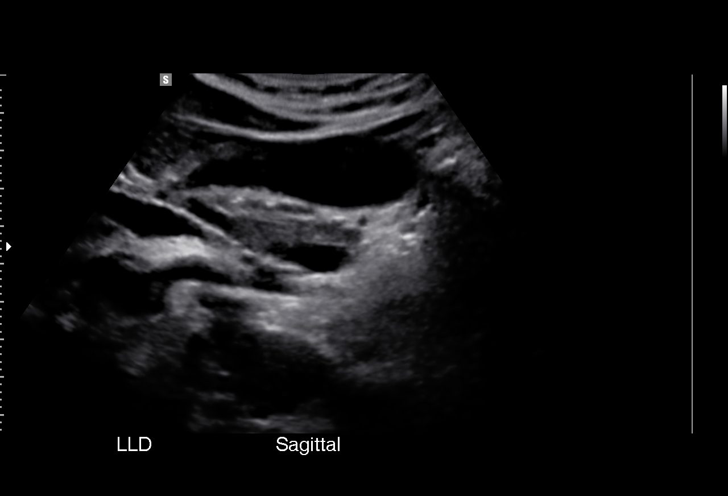
[im 86/86]
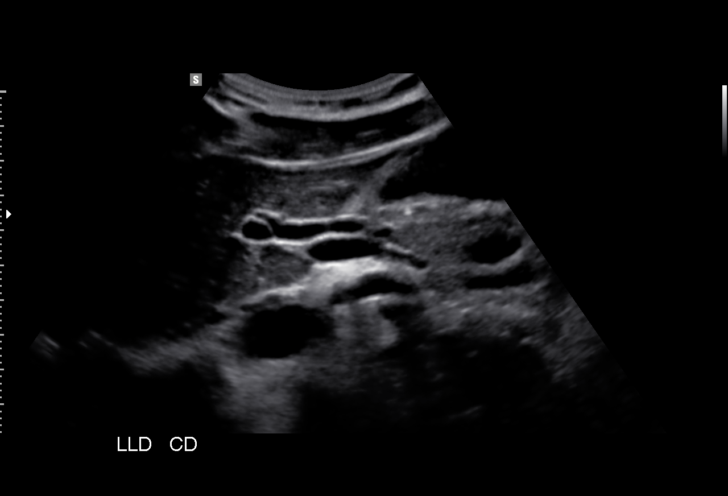

[15 of 25 positions shown; findings below may reference images not displayed]

FINDINGS: Gallbladder: No gallstones or wall thickening visualized. No
sonographic Murphy sign noted by sonographer.

Common bile duct: Diameter: 4 mm, normal

Liver: No focal lesion identified. Within normal limits in
parenchymal echogenicity.

IVC: No abnormality visualized.

Pancreas: Visualized portion unremarkable.

Spleen: Size and appearance within normal limits.

Right Kidney: Length: 11.7 cm. Echogenicity within normal limits. No
mass. Borderline to mild right hydronephrosis (image 23).

Left Kidney: Length: 11.5 cm. Echogenicity within normal limits. No
mass or hydronephrosis visualized.

Abdominal aorta: No aneurysm visualized.

Other findings: None.
IMPRESSION: 1. Borderline to mild right hydronephrosis. In the absence of
hematuria maternal hydronephrosis of pregnancy would be favored.
2. Normal sonographic appearance of the liver, gallbladder, and
biliary tree.

## 2016-07-05 ENCOUNTER — Inpatient Hospital Stay (HOSPITAL_COMMUNITY)
Admission: AD | Admit: 2016-07-05 | Discharge: 2016-07-05 | Disposition: A | Discharge status: Home / Self Care | Payer: Medicaid Other | Source: Ambulatory Visit | Attending: Obstetrics & Gynecology | Admitting: Obstetrics & Gynecology

## 2016-07-05 ENCOUNTER — Encounter (HOSPITAL_COMMUNITY): Payer: Self-pay

## 2016-07-05 DIAGNOSIS — R109 Unspecified abdominal pain: Secondary | ICD-10-CM | POA: Diagnosis not present

## 2016-07-05 DIAGNOSIS — O219 Vomiting of pregnancy, unspecified: Secondary | ICD-10-CM | POA: Diagnosis not present

## 2016-07-05 DIAGNOSIS — O212 Late vomiting of pregnancy: Secondary | ICD-10-CM | POA: Diagnosis not present

## 2016-07-05 DIAGNOSIS — R102 Pelvic and perineal pain: Secondary | ICD-10-CM | POA: Diagnosis not present

## 2016-07-05 DIAGNOSIS — K5900 Constipation, unspecified: Secondary | ICD-10-CM | POA: Insufficient documentation

## 2016-07-05 DIAGNOSIS — O26893 Other specified pregnancy related conditions, third trimester: Secondary | ICD-10-CM | POA: Insufficient documentation

## 2016-07-05 DIAGNOSIS — O368130 Decreased fetal movements, third trimester, not applicable or unspecified: Secondary | ICD-10-CM | POA: Diagnosis present

## 2016-07-05 DIAGNOSIS — Z3A38 38 weeks gestation of pregnancy: Secondary | ICD-10-CM | POA: Diagnosis not present

## 2016-07-05 DIAGNOSIS — O26899 Other specified pregnancy related conditions, unspecified trimester: Secondary | ICD-10-CM

## 2016-07-05 LAB — URINE MICROSCOPIC-ADD ON: RBC / HPF: NONE SEEN RBC/hpf (ref 0–5)

## 2016-07-05 LAB — URINALYSIS, ROUTINE W REFLEX MICROSCOPIC
BILIRUBIN URINE: NEGATIVE
Glucose, UA: NEGATIVE mg/dL
HGB URINE DIPSTICK: NEGATIVE
KETONES UR: NEGATIVE mg/dL
NITRITE: NEGATIVE
Protein, ur: NEGATIVE mg/dL
Specific Gravity, Urine: >1.03 — ABNORMAL HIGH (ref 1.005–1.030)
pH: 6 (ref 5.0–8.0)

## 2016-07-05 MED ORDER — PROMETHAZINE HCL 25 MG PO TABS: 25.0000 mg | ORAL_TABLET | Freq: Four times a day (QID) | ORAL | 0 refills | Status: DC | PRN

## 2016-07-05 MED ORDER — FLEET ENEMA 7-19 GM/118ML RE ENEM
1.0000 | ENEMA | Freq: Once | RECTAL | Status: AC
  Administered 2016-07-05: 1 via RECTAL

## 2016-07-05 MED ORDER — CYCLOBENZAPRINE HCL 10 MG PO TABS: 10.0000 mg | ORAL_TABLET | Freq: Two times a day (BID) | ORAL | 0 refills | Status: DC | PRN

## 2016-07-05 NOTE — Discharge Instructions (Signed)

## 2016-07-05 NOTE — MAU Provider Note (Signed)
History     CSN: [id number]  Arrival date and time: 07/05/16 1247   First Provider Initiated Contact with Patient 07/05/16 1356      Chief Complaint  Patient presents with  . Emesis  . Insomnia   HPI   Ms.Sharon Barnett is a 25 y.o. female G1P0000 @ 302w1d here in MAU with complaints of insomnia, constipation and decreased fetal movement. When she lays down at night she cannot get comfortable. During the day she is uncomfortable with movement, however at night it worsens. She denies vaginal bleeding or leaking of fluid. + fetal movements.   OB History    Gravida Para Term Preterm AB Living   1 0 0 0 0 0   SAB TAB Ectopic Multiple Live Births   0 0 0 0        Past Medical History:  Diagnosis Date  . ADHD (attention deficit hyperactivity disorder)   . Boils   . Complication of anesthesia    Hypersensitive to anesthesia (wisdom teeth)  . Headache   . Hyperemesis gravidarum   . Vaginal Pap smear, abnormal     Past Surgical History:  Procedure Laterality Date  . WISDOM TOOTH EXTRACTION      Family History  Problem Relation Age of Onset  . Depression Mother   . Diabetes Mother   . Hypertension Mother   . Miscarriages / [place] Mother   . Asthma Sister   . Depression Sister   . Hearing loss Sister   . Birth defects Sister   . Hyperlipidemia Sister   . Hypertension Sister   . Miscarriages / Stillbirths Sister   . Asthma Maternal Grandmother   . Cancer Maternal Grandmother   . Diabetes Maternal Grandmother   . Hypertension Maternal Grandmother   . Stroke Maternal Grandmother   . Vision loss Maternal Grandmother   . Cancer Paternal Grandmother     Social History  Substance Use Topics  . Smoking status: Never Smoker  . Smokeless tobacco: Never Used  . Alcohol use No    Allergies: No Known Allergies  Prescriptions Prior to Admission  Medication Sig Dispense Refill Last Dose  . acetaminophen (TYLENOL) 325 MG tablet Take 325 mg by mouth every 6 (six)  hours as needed for moderate pain.   07/04/2016 at Unknown time  . ondansetron (ZOFRAN ODT) 4 MG disintegrating tablet Take 1 tablet (4 mg total) by mouth every 8 (eight) hours as needed for nausea or vomiting. 15 tablet 0 07/04/2016 at Unknown time  . promethazine (PHENERGAN) 12.5 MG tablet Take 1 tablet (12.5 mg total) by mouth every 6 (six) hours as needed for nausea or vomiting. 30 tablet 0 Past Week at Unknown time  . NIFEdipine (PROCARDIA-XL/ADALAT-CC/NIFEDICAL-XL) 30 MG 24 hr tablet Take 1 tablet (30 mg total) by mouth daily. (Patient not taking: Reported on 07/05/2016) 7 tablet 0 Not Taking at Unknown time   Results for orders placed or performed during the hospital encounter of 07/05/16 (from the past 48 hour(s))  Urinalysis, Routine w reflex microscopic (not at [facility])     Status: Abnormal   Collection Time: 07/05/16  1:15 PM  Result Value Ref Range   Color, Urine YELLOW YELLOW   APPearance HAZY (A) CLEAR   Specific Gravity, Urine >1.030 (H) 1.005 - 1.030   pH 6.0 5.0 - 8.0   Glucose, UA NEGATIVE NEGATIVE mg/dL   Hgb urine dipstick NEGATIVE NEGATIVE   Bilirubin Urine NEGATIVE NEGATIVE   Ketones, ur NEGATIVE NEGATIVE mg/dL  Protein, ur NEGATIVE NEGATIVE mg/dL   Nitrite NEGATIVE NEGATIVE   Leukocytes, UA SMALL (A) NEGATIVE  Urine microscopic-add on     Status: Abnormal   Collection Time: 07/05/16  1:15 PM  Result Value Ref Range   Squamous Epithelial / LPF 6-30 (A) NONE SEEN   WBC, UA 0-5 0 - 5 WBC/hpf   RBC / HPF NONE SEEN 0 - 5 RBC/hpf   Bacteria, UA FEW (A) NONE SEEN    Review of Systems  Constitutional: Negative for chills and fever.  Gastrointestinal: Positive for abdominal pain (Contraction pain) and constipation.  Genitourinary: Negative for dysuria, frequency and urgency.   Physical Exam   Blood pressure 136/77, pulse 100, temperature 98.2 F (36.8 C), resp. rate 18, height 5\' 4"  (1.626 m), weight 172 lb 12.8 oz (78.4 kg), last menstrual period  10/20/2015.  Physical Exam  Constitutional: She is oriented to person, place, and time. She appears well-developed and well-nourished. No distress.  HENT:  Head: Normocephalic.  Eyes: Pupils are equal, round, and reactive to light.  GI: Soft. She exhibits no distension. There is no tenderness. There is no rebound.  Genitourinary:  Genitourinary Comments: Dilation: 2 Effacement (%): 50 Station: -1 Presentation: Vertex Exam by:: Venia CarbonJennifer Rasch NP  Musculoskeletal: Normal range of motion.  Neurological: She is alert and oriented to person, place, and time.  Skin: Skin is warm. She is not diaphoretic.  Psychiatric: Her behavior is normal.   Fetal Tracing: Baseline: 125 bpm Variability: Moderate  Accelerations: 15x15 Decelerations: none Toco: Irregular pattern   MAU Course  Procedures  None  MDM UA Fleets enema; patient desires to take home to use.   Assessment and Plan    A:  1. Constipation, unspecified constipation type   2. Nausea and vomiting in pregnancy   3. Pelvic pain during pregnancy     P:  Discharge home in stable condition  Rx Phenergan, flexeril Labor precautions Return precautions discussed.   Duane LopeJennifer I Rasch, NP 07/05/2016 8:34 PM

## 2016-07-05 NOTE — MAU Note (Signed)
Pt presents to MAU with complaints of nausea, vomiting with her whole pregnancy. States she has not been able to have a bowel movement for 2 or 3 days. Also states that she has a decrease in fetal movement today. Denies any vaginal bleeding , reports an increase in vaginal discharge

## 2016-07-06 ENCOUNTER — Inpatient Hospital Stay (HOSPITAL_COMMUNITY)
Admission: AD | Admit: 2016-07-06 | Discharge: 2016-07-06 | Disposition: A | Discharge status: Home / Self Care | Payer: Medicaid Other | Source: Ambulatory Visit | Attending: Obstetrics & Gynecology | Admitting: Obstetrics & Gynecology

## 2016-07-06 DIAGNOSIS — O26893 Other specified pregnancy related conditions, third trimester: Secondary | ICD-10-CM | POA: Diagnosis not present

## 2016-07-06 DIAGNOSIS — K5900 Constipation, unspecified: Secondary | ICD-10-CM | POA: Insufficient documentation

## 2016-07-06 DIAGNOSIS — R109 Unspecified abdominal pain: Secondary | ICD-10-CM | POA: Diagnosis not present

## 2016-07-06 DIAGNOSIS — F909 Attention-deficit hyperactivity disorder, unspecified type: Secondary | ICD-10-CM | POA: Insufficient documentation

## 2016-07-06 DIAGNOSIS — Z3A38 38 weeks gestation of pregnancy: Secondary | ICD-10-CM | POA: Diagnosis not present

## 2016-07-06 DIAGNOSIS — O99613 Diseases of the digestive system complicating pregnancy, third trimester: Secondary | ICD-10-CM | POA: Diagnosis present

## 2016-07-06 DIAGNOSIS — O99343 Other mental disorders complicating pregnancy, third trimester: Secondary | ICD-10-CM | POA: Insufficient documentation

## 2016-07-06 DIAGNOSIS — O211 Hyperemesis gravidarum with metabolic disturbance: Secondary | ICD-10-CM

## 2016-07-06 DIAGNOSIS — O26899 Other specified pregnancy related conditions, unspecified trimester: Secondary | ICD-10-CM

## 2016-07-06 NOTE — MAU Provider Note (Signed)
History     CSN: [id number]  Arrival date and time: 07/06/16 1931   First Provider Initiated Contact with Patient 07/06/16 2038       Is a 25 year old female G1 P0 at 38 weeks and 2 days. She presents for lower abdominal cramping was present from yesterday. She was seen in the MA U diagnosed with constipation given something to help move her bowels. She has had 4 loose watery bowel movements today. She reports that contracting has gotten worse this afternoon. She reports she is only drinking 8 ounces of water in the past 24 hours. She denies any nausea and reports this is just because she has no appetite. She reports regular fetal movement and denies any vaginal bleeding.    OB History    Gravida Para Term Preterm AB Living   1 0 0 0 0 0   SAB TAB Ectopic Multiple Live Births   0 0 0 0        Past Medical History:  Diagnosis Date  . ADHD (attention deficit hyperactivity disorder)   . Boils   . Complication of anesthesia    Hypersensitive to anesthesia (wisdom teeth)  . Headache   . Hyperemesis gravidarum   . Vaginal Pap smear, abnormal     Past Surgical History:  Procedure Laterality Date  . WISDOM TOOTH EXTRACTION      Family History  Problem Relation Age of Onset  . Depression Mother   . Diabetes Mother   . Hypertension Mother   . Miscarriages / [place] Mother   . Asthma Sister   . Depression Sister   . Hearing loss Sister   . Birth defects Sister   . Hyperlipidemia Sister   . Hypertension Sister   . Miscarriages / Stillbirths Sister   . Asthma Maternal Grandmother   . Cancer Maternal Grandmother   . Diabetes Maternal Grandmother   . Hypertension Maternal Grandmother   . Stroke Maternal Grandmother   . Vision loss Maternal Grandmother   . Cancer Paternal Grandmother     Social History  Substance Use Topics  . Smoking status: Never Smoker  . Smokeless tobacco: Never Used  . Alcohol use No    Allergies: No Known Allergies  Prescriptions Prior  to Admission  Medication Sig Dispense Refill Last Dose  . acetaminophen (TYLENOL) 325 MG tablet Take 325 mg by mouth every 6 (six) hours as needed for moderate pain.   07/04/2016 at Unknown time  . cyclobenzaprine (FLEXERIL) 10 MG tablet Take 1 tablet (10 mg total) by mouth 2 (two) times daily as needed for muscle spasms. 20 tablet 0   . promethazine (PHENERGAN) 25 MG tablet Take 1 tablet (25 mg total) by mouth every 6 (six) hours as needed for nausea or vomiting. 30 tablet 0     Review of Systems  Constitutional: Negative for chills and fever.  HENT: Negative for congestion.   Eyes: Negative for blurred vision and double vision.  Respiratory: Negative for cough and shortness of breath.   Cardiovascular: Negative for chest pain and palpitations.  Gastrointestinal: Positive for abdominal pain and diarrhea. Negative for heartburn, nausea and vomiting.  Genitourinary: Negative for dysuria and urgency.  Skin: Negative for itching and rash.  Neurological: Negative for dizziness and tingling.   Physical Exam   Blood pressure 126/77, pulse 113, temperature 98.3 F (36.8 C), temperature source Oral, height 5\' 8"  (1.727 m), weight 174 lb (78.9 kg), last menstrual period 10/20/2015, SpO2 98 %.  Physical Exam  Constitutional:  She is oriented to person, place, and time. She appears well-developed and well-nourished.  HENT:  Head: Normocephalic and atraumatic.  Cardiovascular: Intact distal pulses.   Mildly tachycardic  Respiratory: Effort normal. No respiratory distress.  GI: Soft. Bowel sounds are normal. She exhibits no distension. There is no tenderness. There is no rebound.  Gravid  Neurological: She is alert and oriented to person, place, and time. No cranial nerve deficit.  Skin: Skin is warm and dry.  Psychiatric: She has a normal mood and affect. Her behavior is normal.   The vertex is 2 cm dilated unchanged from evaluation yesterday. MAU Course  Procedures  MDM In MAU patient  was placed on continuous fetal monitoring with tocometry. She was showing contractions every 3-5 minutes. Her NST was reactive. She was offered pain medication to take before she went home to help her sleep tonight. Patient declined this offer. Additionally patient was encouraged to drink oral hydration. She was able to drink some fluids but refused to drink more than a few sips due to no appetite. She declined any nausea medicine to help her drink.  Assessment and Plan  #1: Abdominal cramping likely combination of early labor and abdominal cramping from laxative use. Recommended aggressive by mouth hydration. Declined any medication to help with the pain at this time. Patient discharged home in stable condition. Cervix was unchanged from yesterday.  Ernestina Pennaicholas Schenk 07/06/2016, 8:50 PM

## 2016-07-06 NOTE — Discharge Instructions (Signed)
Braxton Hicks Contractions °Contractions of the uterus can occur throughout pregnancy. Contractions are not always a sign that you are in labor.  °WHAT ARE BRAXTON HICKS CONTRACTIONS?  °Contractions that occur before labor are called Braxton Hicks contractions, or false labor. Toward the end of pregnancy (32-34 weeks), these contractions can develop more often and may become more forceful. This is not true labor because these contractions do not result in opening (dilatation) and thinning of the cervix. They are sometimes difficult to tell apart from true labor because these contractions can be forceful and people have different pain tolerances. You should not feel embarrassed if you go to the hospital with false labor. Sometimes, the only way to tell if you are in true labor is for your health care provider to look for changes in the cervix. °If there are no prenatal problems or other health problems associated with the pregnancy, it is completely safe to be sent home with false labor and await the onset of true labor. °HOW CAN YOU TELL THE DIFFERENCE BETWEEN TRUE AND FALSE LABOR? °False Labor °· The contractions of false labor are usually shorter and not as hard as those of true labor.   °· The contractions are usually irregular.   °· The contractions are often felt in the front of the lower abdomen and in the groin.   °· The contractions may go away when you walk around or change positions while lying down.   °· The contractions get weaker and are shorter lasting as time goes on.   °· The contractions do not usually become progressively stronger, regular, and closer together as with true labor.   °True Labor °· Contractions in true labor last 30-70 seconds, become very regular, usually become more intense, and increase in frequency.   °· The contractions do not go away with walking.   °· The discomfort is usually felt in the top of the uterus and spreads to the lower abdomen and low back.   °· True labor can be  determined by your health care provider with an exam. This will show that the cervix is dilating and getting thinner.   °WHAT TO REMEMBER °· Keep up with your usual exercises and follow other instructions given by your health care provider.   °· Take medicines as directed by your health care provider.   °· Keep your regular prenatal appointments.   °· Eat and drink lightly if you think you are going into labor.   °· If Braxton Hicks contractions are making you uncomfortable:   °¨ Change your position from lying down or resting to walking, or from walking to resting.   °¨ Sit and rest in a tub of warm water.   °¨ Drink 2-3 glasses of water. Dehydration may cause these contractions.   °¨ Do slow and deep breathing several times an hour.   °WHEN SHOULD I SEEK IMMEDIATE MEDICAL CARE? °Seek immediate medical care if: °· Your contractions become stronger, more regular, and closer together.   °· You have fluid leaking or gushing from your vagina.   °· You have a fever.   °· You pass blood-tinged mucus.   °· You have vaginal bleeding.   °· You have continuous abdominal pain.   °· You have low back pain that you never had before.   °· You feel your baby's head pushing down and causing pelvic pressure.   °· Your baby is not moving as much as it used to.   °  °This information is not intended to replace advice given to you by your health care provider. Make sure you discuss any questions you have with your health care   provider. °  °Document Released: 08/21/2005 Document Revised: 08/26/2013 Document Reviewed: 06/02/2013 °Elsevier Interactive Patient Education ©2016 [employer] ° °

## 2016-07-06 NOTE — MAU Note (Signed)
Pt reports she was seen here yesterday for cramping and decreased fetal movement. States when she was discharged she was still cramping really bad and still was not able to feel movement. States today she is still cramping really bad and still is not feeling a lot of movement.

## 2016-07-08 ENCOUNTER — Inpatient Hospital Stay (HOSPITAL_COMMUNITY)
Admission: AD | Admit: 2016-07-08 | Discharge: 2016-07-09 | Disposition: A | Discharge status: Home / Self Care | Payer: Medicaid Other | Source: Ambulatory Visit | Attending: Obstetrics and Gynecology | Admitting: Obstetrics and Gynecology

## 2016-07-08 ENCOUNTER — Encounter (HOSPITAL_COMMUNITY): Payer: Self-pay

## 2016-07-08 DIAGNOSIS — O26893 Other specified pregnancy related conditions, third trimester: Secondary | ICD-10-CM | POA: Insufficient documentation

## 2016-07-08 DIAGNOSIS — O211 Hyperemesis gravidarum with metabolic disturbance: Secondary | ICD-10-CM

## 2016-07-08 DIAGNOSIS — R51 Headache: Secondary | ICD-10-CM | POA: Diagnosis not present

## 2016-07-08 DIAGNOSIS — Z3A38 38 weeks gestation of pregnancy: Secondary | ICD-10-CM | POA: Insufficient documentation

## 2016-07-08 DIAGNOSIS — O9989 Other specified diseases and conditions complicating pregnancy, childbirth and the puerperium: Secondary | ICD-10-CM | POA: Insufficient documentation

## 2016-07-08 DIAGNOSIS — O479 False labor, unspecified: Secondary | ICD-10-CM

## 2016-07-08 DIAGNOSIS — O471 False labor at or after 37 completed weeks of gestation: Secondary | ICD-10-CM | POA: Insufficient documentation

## 2016-07-08 DIAGNOSIS — O99613 Diseases of the digestive system complicating pregnancy, third trimester: Secondary | ICD-10-CM | POA: Diagnosis present

## 2016-07-08 DIAGNOSIS — O99343 Other mental disorders complicating pregnancy, third trimester: Secondary | ICD-10-CM | POA: Insufficient documentation

## 2016-07-08 NOTE — MAU Provider Note (Signed)
History     CSN: [id number]  Arrival date and time: 07/08/16 2218   First Provider Initiated Contact with Patient 07/08/16 2302      Chief Complaint  Patient presents with  . vaginal pressure  . Dizziness  . rectal pressure   25 year-old G1P0 female at 5998w4d who presents with lower abdominal pressure/pain. Her pressure has been going on for over a week.  Patient claims the pressure is only present when she bears down and tries to have a bowel movement.  Her last BM was yesterday but it was watery diarrhea.  She has been seen several times this week in the MAU for similar complaints and was given phenergan and flexeril for leg pain.  She stopped taking these medications after one dose because they haven't helped her.  Patient reports having roughly 8 contractions an hour at home.  Additionally, she had hot flashes while standing in line earlier today.  She also endorses green/yellow vaginal discharge that began today.  No recent intercourse.  No vaginal itching or burning.  No vaginal bleeding or leakage of fluid.      Past Medical History:  Diagnosis Date  . ADHD (attention deficit hyperactivity disorder)   . Boils   . Complication of anesthesia    Hypersensitive to anesthesia (wisdom teeth)  . Headache   . Hyperemesis gravidarum   . Vaginal Pap smear, abnormal     Past Surgical History:  Procedure Laterality Date  . WISDOM TOOTH EXTRACTION      Family History  Problem Relation Age of Onset  . Depression Mother   . Diabetes Mother   . Hypertension Mother   . Miscarriages / [place] Mother   . Asthma Sister   . Depression Sister   . Hearing loss Sister   . Birth defects Sister   . Hyperlipidemia Sister   . Hypertension Sister   . Miscarriages / Stillbirths Sister   . Asthma Maternal Grandmother   . Cancer Maternal Grandmother   . Diabetes Maternal Grandmother   . Hypertension Maternal Grandmother   . Stroke Maternal Grandmother   . Vision loss Maternal  Grandmother   . Cancer Paternal Grandmother     Social History  Substance Use Topics  . Smoking status: Never Smoker  . Smokeless tobacco: Never Used  . Alcohol use No    Allergies: No Known Allergies  Prescriptions Prior to Admission  Medication Sig Dispense Refill Last Dose  . acetaminophen (TYLENOL) 325 MG tablet Take 325 mg by mouth every 6 (six) hours as needed for moderate pain.   07/04/2016 at Unknown time  . cyclobenzaprine (FLEXERIL) 10 MG tablet Take 1 tablet (10 mg total) by mouth 2 (two) times daily as needed for muscle spasms. 20 tablet 0   . promethazine (PHENERGAN) 25 MG tablet Take 1 tablet (25 mg total) by mouth every 6 (six) hours as needed for nausea or vomiting. 30 tablet 0     Review of Systems  Constitutional: Positive for diaphoresis. Negative for chills and fever.  Eyes: Negative for blurred vision.  Respiratory: Negative for shortness of breath.   Cardiovascular: Negative for chest pain.  Gastrointestinal: Positive for constipation. Negative for nausea.  Genitourinary: Negative for dysuria, flank pain, hematuria and urgency.  Musculoskeletal: Positive for myalgias.  Neurological: Positive for dizziness, weakness and headaches.  Psychiatric/Behavioral: Negative for depression.   Physical Exam   Blood pressure 132/85, pulse 95, temperature 98.3 F (36.8 C), temperature source Oral, resp. rate 18, height 5\' 8"  (  1.727 m), weight 174 lb (78.9 kg), last menstrual period 10/20/2015, SpO2 97 %.  Physical Exam  MAU Course  Procedures  MDM Patient presents to the MAU for labor check secondary to frequent contractions, constipation, and lower extremity paresthesias  Assessment and Plan  Patient is to be discharged home on labor precautions  Josue D Santos 07/08/2016, 11:32 PM   CNM attestation:  Irish Lackynneesha C Mcgeehan is a 25 y.o. G1P0000 at 677w6d reporting contractions, rectal pressure which she attributes to constipation, but actually had loose stools.    PE: Gen: calm comfortable, NAD Resp: normal effort, no distress Heart: Regular rate Abd: Soft, NT, gravid, S=D Pelvic: No cervical change. No LOF. Scant old bloody show.  NST reactive   MDM Braxton Hicks contractions w/out cervical change. Normal discharge (maybe slight old bloody show). Normal discomforts of pregnancy  Plan: - Discharge home in stable condition. - Labor precautions and fetal kick counts. - Follow-up as scheduled at your doctor's office for next prenatal visit or sooner as needed if symptoms worsen. - Return to maternity admissions symptoms worsen  [place] Ahnesti Townsend, [place] 07/10/2016 9:20 AM

## 2016-07-08 NOTE — MAU Note (Addendum)
Pt c/o difficulty walking. Pt states she feels severe pressure in her rectum and vagina. Pt states she is having contractions but it feels like tightening and isn't painful. Pt states the baby has been moving normally today. Pt denies bleeding and leaking of fluid. Pt c/o heat flashes and headache. Pt c/o increased swelling in her legs since yesterday.

## 2016-07-09 ENCOUNTER — Encounter (HOSPITAL_COMMUNITY): Payer: Self-pay | Admitting: *Deleted

## 2016-07-09 ENCOUNTER — Inpatient Hospital Stay (HOSPITAL_COMMUNITY)
Admission: AD | Admit: 2016-07-09 | Discharge: 2016-07-09 | Disposition: A | Discharge status: Home / Self Care | Payer: Medicaid Other | Source: Ambulatory Visit | Attending: Obstetrics and Gynecology | Admitting: Obstetrics and Gynecology

## 2016-07-09 DIAGNOSIS — O479 False labor, unspecified: Secondary | ICD-10-CM | POA: Insufficient documentation

## 2016-07-09 DIAGNOSIS — O471 False labor at or after 37 completed weeks of gestation: Secondary | ICD-10-CM | POA: Diagnosis not present

## 2016-07-09 DIAGNOSIS — K5900 Constipation, unspecified: Secondary | ICD-10-CM | POA: Insufficient documentation

## 2016-07-09 DIAGNOSIS — Z3A00 Weeks of gestation of pregnancy not specified: Secondary | ICD-10-CM

## 2016-07-09 DIAGNOSIS — Z3A39 39 weeks gestation of pregnancy: Secondary | ICD-10-CM

## 2016-07-09 LAB — WET PREP, GENITAL
Clue Cells Wet Prep HPF POC: NONE SEEN
SPERM: NONE SEEN
TRICH WET PREP: NONE SEEN
Yeast Wet Prep HPF POC: NONE SEEN

## 2016-07-09 LAB — URINALYSIS, ROUTINE W REFLEX MICROSCOPIC
BILIRUBIN URINE: NEGATIVE
Glucose, UA: NEGATIVE mg/dL
HGB URINE DIPSTICK: NEGATIVE
Ketones, ur: NEGATIVE mg/dL
NITRITE: NEGATIVE
PROTEIN: NEGATIVE mg/dL
Specific Gravity, Urine: 1.025 (ref 1.005–1.030)
pH: 6 (ref 5.0–8.0)

## 2016-07-09 LAB — URINE MICROSCOPIC-ADD ON

## 2016-07-09 NOTE — Discharge Instructions (Signed)
Fetal Movement Counts °Patient Name: __________________________________________________ Patient Due Date: ____________________ °Performing a fetal movement count is highly recommended in high-risk pregnancies, but it is good for every pregnant woman to do. Your health care provider may ask you to start counting fetal movements at 28 weeks of the pregnancy. Fetal movements often increase: °· After eating a full meal. °· After physical activity. °· After eating or drinking something sweet or cold. °· At rest. °Pay attention to when you feel the baby is most active. This will help you notice a pattern of your baby's sleep and wake cycles and what factors contribute to an increase in fetal movement. It is important to perform a fetal movement count at the same time each day when your baby is normally most active.  °HOW TO COUNT FETAL MOVEMENTS °1. Find a quiet and comfortable area to sit or lie down on your left side. Lying on your left side provides the best blood and oxygen circulation to your baby. °2. Write down the day and time on a sheet of paper or in a journal. °3. Start counting kicks, flutters, swishes, rolls, or jabs in a 2-hour period. You should feel at least 10 movements within 2 hours. °4. If you do not feel 10 movements in 2 hours, wait 2-3 hours and count again. Look for a change in the pattern or not enough counts in 2 hours. °SEEK MEDICAL CARE IF: °· You feel less than 10 counts in 2 hours, tried twice. °· There is no movement in over an hour. °· The pattern is changing or taking longer each day to reach 10 counts in 2 hours. °· You feel the baby is not moving as he or she usually does. °Date: ____________ Movements: ____________ Start time: ____________ Finish time: ____________  °Date: ____________ Movements: ____________ Start time: ____________ Finish time: ____________ °Date: ____________ Movements: ____________ Start time: ____________ Finish time: ____________ °Date: ____________ Movements:  ____________ Start time: ____________ Finish time: ____________ °Date: ____________ Movements: ____________ Start time: ____________ Finish time: ____________ °Date: ____________ Movements: ____________ Start time: ____________ Finish time: ____________ °Date: ____________ Movements: ____________ Start time: ____________ Finish time: ____________ °Date: ____________ Movements: ____________ Start time: ____________ Finish time: ____________  °Date: ____________ Movements: ____________ Start time: ____________ Finish time: ____________ °Date: ____________ Movements: ____________ Start time: ____________ Finish time: ____________ °Date: ____________ Movements: ____________ Start time: ____________ Finish time: ____________ °Date: ____________ Movements: ____________ Start time: ____________ Finish time: ____________ °Date: ____________ Movements: ____________ Start time: ____________ Finish time: ____________ °Date: ____________ Movements: ____________ Start time: ____________ Finish time: ____________ °Date: ____________ Movements: ____________ Start time: ____________ Finish time: ____________  °Date: ____________ Movements: ____________ Start time: ____________ Finish time: ____________ °Date: ____________ Movements: ____________ Start time: ____________ Finish time: ____________ °Date: ____________ Movements: ____________ Start time: ____________ Finish time: ____________ °Date: ____________ Movements: ____________ Start time: ____________ Finish time: ____________ °Date: ____________ Movements: ____________ Start time: ____________ Finish time: ____________ °Date: ____________ Movements: ____________ Start time: ____________ Finish time: ____________ °Date: ____________ Movements: ____________ Start time: ____________ Finish time: ____________  °Date: ____________ Movements: ____________ Start time: ____________ Finish time: ____________ °Date: ____________ Movements: ____________ Start time: ____________ Finish  time: ____________ °Date: ____________ Movements: ____________ Start time: ____________ Finish time: ____________ °Date: ____________ Movements: ____________ Start time: ____________ Finish time: ____________ °Date: ____________ Movements: ____________ Start time: ____________ Finish time: ____________ °Date: ____________ Movements: ____________ Start time: ____________ Finish time: ____________ °Date: ____________ Movements: ____________ Start time: ____________ Finish time: ____________  °Date: ____________ Movements: ____________ Start time: ____________ Finish   time: ____________ °Date: ____________ Movements: ____________ Start time: ____________ Finish time: ____________ °Date: ____________ Movements: ____________ Start time: ____________ Finish time: ____________ °Date: ____________ Movements: ____________ Start time: ____________ Finish time: ____________ °Date: ____________ Movements: ____________ Start time: ____________ Finish time: ____________ °Date: ____________ Movements: ____________ Start time: ____________ Finish time: ____________ °Date: ____________ Movements: ____________ Start time: ____________ Finish time: ____________  °Date: ____________ Movements: ____________ Start time: ____________ Finish time: ____________ °Date: ____________ Movements: ____________ Start time: ____________ Finish time: ____________ °Date: ____________ Movements: ____________ Start time: ____________ Finish time: ____________ °Date: ____________ Movements: ____________ Start time: ____________ Finish time: ____________ °Date: ____________ Movements: ____________ Start time: ____________ Finish time: ____________ °Date: ____________ Movements: ____________ Start time: ____________ Finish time: ____________ °Date: ____________ Movements: ____________ Start time: ____________ Finish time: ____________  °Date: ____________ Movements: ____________ Start time: ____________ Finish time: ____________ °Date: ____________  Movements: ____________ Start time: ____________ Finish time: ____________ °Date: ____________ Movements: ____________ Start time: ____________ Finish time: ____________ °Date: ____________ Movements: ____________ Start time: ____________ Finish time: ____________ °Date: ____________ Movements: ____________ Start time: ____________ Finish time: ____________ °Date: ____________ Movements: ____________ Start time: ____________ Finish time: ____________ °Date: ____________ Movements: ____________ Start time: ____________ Finish time: ____________  °Date: ____________ Movements: ____________ Start time: ____________ Finish time: ____________ °Date: ____________ Movements: ____________ Start time: ____________ Finish time: ____________ °Date: ____________ Movements: ____________ Start time: ____________ Finish time: ____________ °Date: ____________ Movements: ____________ Start time: ____________ Finish time: ____________ °Date: ____________ Movements: ____________ Start time: ____________ Finish time: ____________ °Date: ____________ Movements: ____________ Start time: ____________ Finish time: ____________ °  °This information is not intended to replace advice given to you by your health care provider. Make sure you discuss any questions you have with your health care provider. °  °Document Released: 09/20/2006 Document Revised: 09/11/2014 Document Reviewed: 06/17/2012 °Elsevier Interactive Patient Education ©2016 [employer] °Vaginal Delivery °During delivery, your health care provider will help you give birth to your baby. During a vaginal delivery, you will work to push the baby out of your vagina. However, before you can push your baby out, a few things need to happen. The opening of your uterus (cervix) has to soften, thin out, and open up (dilate) all the way to 10 cm. Also, your baby has to move down from the uterus into your vagina.  °SIGNS OF LABOR  °Your health care provider will first need to make sure you  are in labor. Signs of labor include:  °· Passing what is called the mucous plug before labor begins. This is a small amount of blood-stained mucus. °· Having regular, painful uterine contractions.   °· The time between contractions gets shorter.   °· The discomfort and pain gradually get more intense. °· Contraction pains get worse when walking and do not go away when resting.   °· Your cervix becomes thinner (effacement) and dilates. °BEFORE THE DELIVERY °Once you are in labor and admitted into the hospital or care center, your health care provider may do the following:  °· Perform a complete physical exam. °· Review any complications related to pregnancy or labor.  °· Check your blood pressure, pulse, temperature, and heart rate (vital signs).   °· Determine if, and when, the rupture of amniotic membranes occurred. °· Do a vaginal exam (using a sterile glove and lubricant) to determine:   °¨ The position (presentation) of the baby. Is the baby's head presenting first (vertex) in the birth canal (vagina), or are the feet or buttocks first (breech)?   °¨ The level (station) of the baby's head within   the birth canal.   °¨ The effacement and dilatation of the cervix.   °· An electronic fetal monitor is usually placed on your abdomen when you first arrive. This is used to monitor your contractions and the baby's heart rate. °¨ When the monitor is on your abdomen (external fetal monitor), it can only pick up the frequency and length of your contractions. It cannot tell the strength of your contractions. °¨ If it becomes necessary for your health care provider to know exactly how strong your contractions are or to see exactly what the baby's heart rate is doing, an internal monitor may be inserted into your vagina and uterus. Your health care provider will discuss the benefits and risks of using an internal monitor and obtain your permission before inserting the device. °¨ Continuous fetal monitoring may be needed if  you have an epidural, are receiving certain medicines (such as oxytocin), or have pregnancy or labor complications. °· An IV access tube may be placed into a vein in your arm to deliver fluids and medicines if necessary. °THREE STAGES OF LABOR AND DELIVERY °Normal labor and delivery is divided into three stages. °First Stage °This stage starts when you begin to contract regularly and your cervix begins to efface and dilate. It ends when your cervix is completely open (fully dilated). The first stage is the longest stage of labor and can last from 3 hours to 15 hours.  °Several methods are available to help with labor pain. You and your health care provider will decide which option is best for you. Options include:  °· Opioid medicines. These are strong pain medicines that you can get through your IV tube or as a shot into your muscle. These medicines lessen pain but do not make it go away completely.  °· Epidural. A medicine is given through a thin tube that is inserted in your back. The medicine numbs the lower part of your body and prevents any pain in that area. °· Paracervical pain medicine. This is an injection of an anesthetic on each side of your cervix.   °· You may request natural childbirth, which does not involve the use of pain medicines or an epidural during labor and delivery. Instead, you will use other things, such as breathing exercises, to help cope with the pain. °Second Stage °The second stage of labor begins when your cervix is fully dilated at 10 cm. It continues until you push your baby down through the birth canal and the baby is born. This stage can take only minutes or several hours. °· The location of your baby's head as it moves through the birth canal is reported as a number called a station. If the baby's head has not started its descent, the station is described as being at minus 3 (-3). When your baby's head is at the zero station, it is at the middle of the birth canal and is engaged  in the pelvis. The station of your baby helps indicate the progress of the second stage of labor. °· When your baby is born, your health care provider may hold the baby with his or her head lowered to prevent amniotic fluid, mucus, and blood from getting into the baby's lungs. The baby's mouth and nose may be suctioned with a small bulb syringe to remove any additional fluid. °· Your health care provider may then place the baby on your stomach. It is important to keep the baby from getting cold. To do this, the health care provider will dry   the baby off, place the baby directly on your skin (with no blankets between you and the baby), and cover the baby with warm, dry blankets.   °· The umbilical cord is cut. °Third Stage °During the third stage of labor, your health care provider will deliver the placenta (afterbirth) and make sure your bleeding is under control. The delivery of the placenta usually takes about 5 minutes but can take up to 30 minutes. After the placenta is delivered, a medicine may be given either by IV or injection to help contract the uterus and control bleeding. If you are planning to breastfeed, you can try to do so now. °After you deliver the placenta, your uterus should contract and get very firm. If your uterus does not remain firm, your health care provider will massage it. This is important because the contraction of the uterus helps cut off bleeding at the site where the placenta was attached to your uterus. If your uterus does not contract properly and stay firm, you may continue to bleed heavily. If there is a lot of bleeding, medicines may be given to contract the uterus and stop the bleeding.  °  °This information is not intended to replace advice given to you by your health care provider. Make sure you discuss any questions you have with your health care provider. °  °Document Released: 05/30/2008 Document Revised: 09/11/2014 Document Reviewed: 04/17/2012 °[employer]vier Interactive  Patient Education ©2016 [employer] ° °

## 2016-07-09 NOTE — Discharge Instructions (Signed)
Braxton Hicks Contractions °Contractions of the uterus can occur throughout pregnancy. Contractions are not always a sign that you are in labor.  °WHAT ARE BRAXTON HICKS CONTRACTIONS?  °Contractions that occur before labor are called Braxton Hicks contractions, or false labor. Toward the end of pregnancy (32-34 weeks), these contractions can develop more often and may become more forceful. This is not true labor because these contractions do not result in opening (dilatation) and thinning of the cervix. They are sometimes difficult to tell apart from true labor because these contractions can be forceful and people have different pain tolerances. You should not feel embarrassed if you go to the hospital with false labor. Sometimes, the only way to tell if you are in true labor is for your health care provider to look for changes in the cervix. °If there are no prenatal problems or other health problems associated with the pregnancy, it is completely safe to be sent home with false labor and await the onset of true labor. °HOW CAN YOU TELL THE DIFFERENCE BETWEEN TRUE AND FALSE LABOR? °False Labor °· The contractions of false labor are usually shorter and not as hard as those of true labor.   °· The contractions are usually irregular.   °· The contractions are often felt in the front of the lower abdomen and in the groin.   °· The contractions may go away when you walk around or change positions while lying down.   °· The contractions get weaker and are shorter lasting as time goes on.   °· The contractions do not usually become progressively stronger, regular, and closer together as with true labor.   °True Labor °· Contractions in true labor last 30-70 seconds, become very regular, usually become more intense, and increase in frequency.   °· The contractions do not go away with walking.   °· The discomfort is usually felt in the top of the uterus and spreads to the lower abdomen and low back.   °· True labor can be  determined by your health care provider with an exam. This will show that the cervix is dilating and getting thinner.   °WHAT TO REMEMBER °· Keep up with your usual exercises and follow other instructions given by your health care provider.   °· Take medicines as directed by your health care provider.   °· Keep your regular prenatal appointments.   °· Eat and drink lightly if you think you are going into labor.   °· If Braxton Hicks contractions are making you uncomfortable:   °¨ Change your position from lying down or resting to walking, or from walking to resting.   °¨ Sit and rest in a tub of warm water.   °¨ Drink 2-3 glasses of water. Dehydration may cause these contractions.   °¨ Do slow and deep breathing several times an hour.   °WHEN SHOULD I SEEK IMMEDIATE MEDICAL CARE? °Seek immediate medical care if: °· Your contractions become stronger, more regular, and closer together.   °· You have fluid leaking or gushing from your vagina.   °· You have a fever.   °· You pass blood-tinged mucus.   °· You have vaginal bleeding.   °· You have continuous abdominal pain.   °· You have low back pain that you never had before.   °· You feel your baby's head pushing down and causing pelvic pressure.   °· Your baby is not moving as much as it used to.   °  °This information is not intended to replace advice given to you by your health care provider. Make sure you discuss any questions you have with your health care   provider. °  °Document Released: 08/21/2005 Document Revised: 08/26/2013 Document Reviewed: 06/02/2013 °Elsevier Interactive Patient Education ©2016 [employer] ° °

## 2016-07-10 ENCOUNTER — Encounter (HOSPITAL_COMMUNITY): Payer: Self-pay | Admitting: *Deleted

## 2016-07-10 ENCOUNTER — Inpatient Hospital Stay (HOSPITAL_COMMUNITY)
Admission: AD | Admit: 2016-07-10 | Discharge: 2016-07-10 | Disposition: A | Discharge status: Home / Self Care | Payer: Medicaid Other | Source: Ambulatory Visit | Attending: Family Medicine | Admitting: Family Medicine

## 2016-07-10 DIAGNOSIS — O211 Hyperemesis gravidarum with metabolic disturbance: Secondary | ICD-10-CM

## 2016-07-10 DIAGNOSIS — Z3403 Encounter for supervision of normal first pregnancy, third trimester: Secondary | ICD-10-CM | POA: Insufficient documentation

## 2016-07-10 MED ORDER — ZOLPIDEM TARTRATE ER 6.25 MG PO TBCR: 6.2500 mg | EXTENDED_RELEASE_TABLET | Freq: Every evening | ORAL | 0 refills | Status: DC | PRN

## 2016-07-10 NOTE — Discharge Instructions (Signed)
Braxton Hicks Contractions °Contractions of the uterus can occur throughout pregnancy. Contractions are not always a sign that you are in labor.  °WHAT ARE BRAXTON HICKS CONTRACTIONS?  °Contractions that occur before labor are called Braxton Hicks contractions, or false labor. Toward the end of pregnancy (32-34 weeks), these contractions can develop more often and may become more forceful. This is not true labor because these contractions do not result in opening (dilatation) and thinning of the cervix. They are sometimes difficult to tell apart from true labor because these contractions can be forceful and people have different pain tolerances. You should not feel embarrassed if you go to the hospital with false labor. Sometimes, the only way to tell if you are in true labor is for your health care provider to look for changes in the cervix. °If there are no prenatal problems or other health problems associated with the pregnancy, it is completely safe to be sent home with false labor and await the onset of true labor. °HOW CAN YOU TELL THE DIFFERENCE BETWEEN TRUE AND FALSE LABOR? °False Labor °· The contractions of false labor are usually shorter and not as hard as those of true labor.   °· The contractions are usually irregular.   °· The contractions are often felt in the front of the lower abdomen and in the groin.   °· The contractions may go away when you walk around or change positions while lying down.   °· The contractions get weaker and are shorter lasting as time goes on.   °· The contractions do not usually become progressively stronger, regular, and closer together as with true labor.   °True Labor °· Contractions in true labor last 30-70 seconds, become very regular, usually become more intense, and increase in frequency.   °· The contractions do not go away with walking.   °· The discomfort is usually felt in the top of the uterus and spreads to the lower abdomen and low back.   °· True labor can be  determined by your health care provider with an exam. This will show that the cervix is dilating and getting thinner.   °WHAT TO REMEMBER °· Keep up with your usual exercises and follow other instructions given by your health care provider.   °· Take medicines as directed by your health care provider.   °· Keep your regular prenatal appointments.   °· Eat and drink lightly if you think you are going into labor.   °· If Braxton Hicks contractions are making you uncomfortable:   °¨ Change your position from lying down or resting to walking, or from walking to resting.   °¨ Sit and rest in a tub of warm water.   °¨ Drink 2-3 glasses of water. Dehydration may cause these contractions.   °¨ Do slow and deep breathing several times an hour.   °WHEN SHOULD I SEEK IMMEDIATE MEDICAL CARE? °Seek immediate medical care if: °· Your contractions become stronger, more regular, and closer together.   °· You have fluid leaking or gushing from your vagina.   °· You have a fever.   °· You have vaginal bleeding.   °· You have continuous abdominal pain.   °· You have low back pain that you never had before.   °· You feel your baby's head pushing down and causing pelvic pressure.   °· Your baby is not moving as much as it used to.   °  °This information is not intended to replace advice given to you by your health care provider. Make sure you discuss any questions you have with your health care provider. °  °Document Released: 08/21/2005   Document Revised: 08/26/2013 Document Reviewed: 06/02/2013 °Elsevier Interactive Patient Education ©2016 [employer] ° °

## 2016-07-10 NOTE — MAU Note (Signed)
Pt sent in MAU yesterday for pelvic pressure, states pressure is worse & is also cramping.  Also having bloody show.  Denies LOF or frank bleeding.  Pt feels like she needs to have a BM but has been able to go - last BM was yesterday.

## 2016-07-10 NOTE — MAU Note (Signed)
Pt waiting for Dr. Genevie AnnSchenk [facility].

## 2016-07-11 ENCOUNTER — Telehealth: Payer: Self-pay | Admitting: *Deleted

## 2016-07-11 MED ORDER — ZOLPIDEM TARTRATE ER 6.25 MG PO TBCR: 6.2500 mg | EXTENDED_RELEASE_TABLET | Freq: Every evening | ORAL | 0 refills | Status: DC | PRN

## 2016-07-11 NOTE — Telephone Encounter (Signed)
Prior Authorization received from [facility] for pharmacy for Zolpidem. Medication has a quantity limit of 15 tablets per 30 days.  Please advise.  Clovis PuMartin, Mclean Moya L, RN

## 2016-07-18 ENCOUNTER — Encounter: Payer: Medicaid Other | Admitting: Certified Nurse Midwife

## 2016-09-20 ENCOUNTER — Encounter (HOSPITAL_COMMUNITY): Payer: Self-pay

## 2017-06-24 ENCOUNTER — Inpatient Hospital Stay (HOSPITAL_COMMUNITY)
Admission: AD | Admit: 2017-06-24 | Discharge: 2017-06-25 | Disposition: A | Discharge status: Home / Self Care | Payer: Medicaid Other | Source: Ambulatory Visit | Attending: Obstetrics & Gynecology | Admitting: Obstetrics & Gynecology

## 2017-06-24 ENCOUNTER — Encounter (HOSPITAL_COMMUNITY): Payer: Self-pay

## 2017-06-24 DIAGNOSIS — Z3202 Encounter for pregnancy test, result negative: Secondary | ICD-10-CM | POA: Diagnosis not present

## 2017-06-24 DIAGNOSIS — R102 Pelvic and perineal pain: Secondary | ICD-10-CM | POA: Diagnosis not present

## 2017-06-24 LAB — URINALYSIS, ROUTINE W REFLEX MICROSCOPIC
Bilirubin Urine: NEGATIVE
GLUCOSE, UA: NEGATIVE mg/dL
Hgb urine dipstick: NEGATIVE
KETONES UR: NEGATIVE mg/dL
LEUKOCYTES UA: NEGATIVE
NITRITE: NEGATIVE
PROTEIN: NEGATIVE mg/dL
Specific Gravity, Urine: 1.026 (ref 1.005–1.030)
pH: 6 (ref 5.0–8.0)

## 2017-06-24 LAB — WET PREP, GENITAL
CLUE CELLS WET PREP: NONE SEEN
TRICH WET PREP: NONE SEEN
YEAST WET PREP: NONE SEEN

## 2017-06-24 LAB — CBC
HEMATOCRIT: 32.8 % — AB (ref 36.0–46.0)
HEMOGLOBIN: 10.9 g/dL — AB (ref 12.0–15.0)
MCH: 29.2 pg (ref 26.0–34.0)
MCHC: 33.2 g/dL (ref 30.0–36.0)
MCV: 87.9 fL (ref 78.0–100.0)
Platelets: 299 10*3/uL (ref 150–400)
RBC: 3.73 MIL/uL — AB (ref 3.87–5.11)
RDW: 13.6 % (ref 11.5–15.5)
WBC: 7.1 10*3/uL (ref 4.0–10.5)

## 2017-06-24 LAB — HCG, SERUM, QUALITATIVE: PREG SERUM: NEGATIVE

## 2017-06-24 LAB — POCT PREGNANCY, URINE: PREG TEST UR: NEGATIVE

## 2017-06-24 NOTE — MAU Provider Note (Signed)
Chief Complaint: Pelvic Pain   First Provider Initiated Contact with Patient 06/24/17 2305      SUBJECTIVE HPI: Sharon Barnett is a 26 y.o. G1P0000 who presents to maternity admissions reporting abdominal and pelvic pain x 3 days that is gradually worsening.  Her period is also 15 days late, which is unusual for her, and she had 2 positive pregnancy tests this week. The first test was positive 2-3 hours after taking it but the second was faintly positive after a few minutes.  She has tried Tylenol for pain but it does not help. Nothing makes the pain better or worse.  It has no other associated symptoms. She denies vaginal bleeding, vaginal itching/burning, urinary symptoms, h/a, dizziness, n/v, or fever/chills.     HPI  Past Medical History:  Diagnosis Date  . ADHD (attention deficit hyperactivity disorder)   . Boils   . Complication of anesthesia    Hypersensitive to anesthesia (wisdom teeth)  . Headache   . Hyperemesis gravidarum   . Vaginal Pap smear, abnormal    Past Surgical History:  Procedure Laterality Date  . WISDOM TOOTH EXTRACTION     Social History   Social History  . Marital status: Single    Spouse name: N/A  . Number of children: N/A  . Years of education: N/A   Occupational History  . Not on file.   Social History Main Topics  . Smoking status: Never Smoker  . Smokeless tobacco: Never Used  . Alcohol use No  . Drug use: No  . Sexual activity: Yes    Birth control/ protection: None   Other Topics Concern  . Not on file   Social History Narrative  . No narrative on file   No current facility-administered medications on file prior to encounter.    Current Outpatient Prescriptions on File Prior to Encounter  Medication Sig Dispense Refill  . zolpidem (AMBIEN CR) 6.25 MG CR tablet Take 1 tablet (6.25 mg total) by mouth at bedtime as needed for sleep. 15 tablet 0   No Known Allergies  ROS:  Review of Systems  Constitutional: Negative for  chills, fatigue and fever.  Respiratory: Negative for shortness of breath.   Cardiovascular: Negative for chest pain.  Gastrointestinal: Negative for nausea and vomiting.  Genitourinary: Positive for pelvic pain. Negative for difficulty urinating, dysuria, flank pain, vaginal bleeding, vaginal discharge and vaginal pain.  Neurological: Negative for dizziness and headaches.  Psychiatric/Behavioral: Negative.      I have reviewed patient's Past Medical Hx, Surgical Hx, Family Hx, Social Hx, medications and allergies.   Physical Exam   Patient Vitals for the past 24 hrs:  BP Temp Temp src Pulse Resp SpO2 Height Weight  06/25/17 0049 125/74 - - 79 18 - - -  06/24/17 2215 (!) 142/74 98.5 F (36.9 C) Oral 70 16 100 % 5\' 8"  (1.727 m) 163 lb (73.9 kg)   Constitutional: Well-developed, well-nourished female in no acute distress.  Cardiovascular: normal rate Respiratory: normal effort GI: Abd soft, non-tender. Pos BS x 4 MS: Extremities nontender, no edema, normal ROM Neurologic: Alert and oriented x 4.  GU: Neg CVAT.  PELVIC EXAM: Cervix pink, visually closed, with multiple flat red discolorations on cervix, scant white creamy discharge, vaginal walls and external genitalia normal Bimanual exam: Cervix 0/long/high, firm, anterior, mild CMT, uterus mildly tender, nonenlarged, adnexa without tenderness, enlargement, or mass  LAB RESULTS Results for orders placed or performed during the hospital encounter of 06/24/17 (from the past  24 hour(s))  Urinalysis, Routine w reflex microscopic     Status: None   Collection Time: 06/24/17 10:18 PM  Result Value Ref Range   Color, Urine YELLOW YELLOW   APPearance CLEAR CLEAR   Specific Gravity, Urine 1.026 1.005 - 1.030   pH 6.0 5.0 - 8.0   Glucose, UA NEGATIVE NEGATIVE mg/dL   Hgb urine dipstick NEGATIVE NEGATIVE   Bilirubin Urine NEGATIVE NEGATIVE   Ketones, ur NEGATIVE NEGATIVE mg/dL   Protein, ur NEGATIVE NEGATIVE mg/dL   Nitrite NEGATIVE  NEGATIVE   Leukocytes, UA NEGATIVE NEGATIVE  Pregnancy, urine POC     Status: None   Collection Time: 06/24/17 10:39 PM  Result Value Ref Range   Preg Test, Ur NEGATIVE NEGATIVE  hCG, serum, qualitative     Status: None   Collection Time: 06/24/17 10:57 PM  Result Value Ref Range   Preg, Serum NEGATIVE NEGATIVE  CBC     Status: Abnormal   Collection Time: 06/24/17 10:57 PM  Result Value Ref Range   WBC 7.1 4.0 - 10.5 K/uL   RBC 3.73 (L) 3.87 - 5.11 MIL/uL   Hemoglobin 10.9 (L) 12.0 - 15.0 g/dL   HCT 13.0 (L) 86.5 - 78.4 %   MCV 87.9 78.0 - 100.0 fL   MCH 29.2 26.0 - 34.0 pg   MCHC 33.2 30.0 - 36.0 g/dL   RDW 69.6 29.5 - 28.4 %   Platelets 299 150 - 400 K/uL  Wet prep, genital     Status: Abnormal   Collection Time: 06/24/17 11:23 PM  Result Value Ref Range   Yeast Wet Prep HPF POC NONE SEEN NONE SEEN   Trich, Wet Prep NONE SEEN NONE SEEN   Clue Cells Wet Prep HPF POC NONE SEEN NONE SEEN   WBC, Wet Prep HPF POC MANY (A) NONE SEEN   Sperm PRESENT        IMAGING No results found.  MAU Management/MDM: Ordered labs and reviewed results.  Pt with mild CMT vs generalized pelvic pain on exam.  Wet prep wnl but with many bacteria. Pt with no known history of STD, and pt denies any recent risks.  Will treat conservatively with ibuprofen Rx, pt to f/u if pain worsens or becomes associated with n/v or fever/chills.  May be dysmenorrhea and menses may begin soon.  Pt denies need for other treatment, was mostly concerned that she may be pregnant.  Pt to f/u with OB/gyn care and to MAU as needed for emergencies.  GCC pending.   Pt discharged with strict pain/infection precautions.  ASSESSMENT 1. Pelvic pain in female   2. Pregnancy examination or test, negative result     PLAN Discharge home Allergies as of 06/25/2017   No Known Allergies     Medication List    TAKE these medications   ibuprofen 600 MG tablet Commonly known as:  ADVIL,MOTRIN Take 1 tablet (600 mg total) by  mouth every 6 (six) hours as needed.   zolpidem 6.25 MG CR tablet Commonly known as:  AMBIEN CR Take 1 tablet (6.25 mg total) by mouth at bedtime as needed for sleep.      Follow-up Information    [facility].   Specialty:  Obstetrics and Gynecology Why:  Or OB/gyn provider of your choice, return to MAU as needed for emergencies Contact information: [address] [place] [address] [phone]          Sharen Counter Certified Nurse-Midwife 06/25/2017  1:05 AM

## 2017-06-24 NOTE — MAU Note (Signed)
Pt reports lower abdominal cramping that started 2 days ago. States she had to leave work early because she was in so much pain. Rates pain 8/10. Pt states she took 2 Tylenol this morning and 1 at 5pm-has not helped. Pt denies vaginal bleeding. Reports thick, white vaginal discharge. Pt denies itching or odor. LMP: 9/7//2018. States she had  +upt at home.

## 2017-06-25 DIAGNOSIS — R102 Pelvic and perineal pain: Secondary | ICD-10-CM

## 2017-06-25 LAB — GC/CHLAMYDIA PROBE AMP (~~LOC~~) NOT AT ARMC
Chlamydia: NEGATIVE
Neisseria Gonorrhea: NEGATIVE

## 2017-06-25 MED ORDER — IBUPROFEN 600 MG PO TABS: 600.0000 mg | ORAL_TABLET | Freq: Four times a day (QID) | ORAL | 1 refills | Status: DC | PRN

## 2019-01-07 ENCOUNTER — Other Ambulatory Visit: Payer: Self-pay

## 2019-01-07 ENCOUNTER — Ambulatory Visit (HOSPITAL_COMMUNITY)
Admission: EM | Admit: 2019-01-07 | Discharge: 2019-01-07 | Disposition: A | Discharge status: Home / Self Care | Payer: Medicaid Other | Attending: Family Medicine | Admitting: Family Medicine

## 2019-01-07 ENCOUNTER — Encounter (HOSPITAL_COMMUNITY): Payer: Self-pay

## 2019-01-07 DIAGNOSIS — A059 Bacterial foodborne intoxication, unspecified: Secondary | ICD-10-CM

## 2019-01-07 LAB — POCT URINALYSIS DIP (DEVICE)
Bilirubin Urine: NEGATIVE
Glucose, UA: NEGATIVE mg/dL
Hgb urine dipstick: NEGATIVE
Ketones, ur: NEGATIVE mg/dL
Nitrite: NEGATIVE
Protein, ur: NEGATIVE mg/dL
Specific Gravity, Urine: >=1.03 (ref 1.005–1.030)
Urobilinogen, UA: 0.2 mg/dL (ref 0.0–1.0)
pH: 5.5 (ref 5.0–8.0)

## 2019-01-07 LAB — POCT PREGNANCY, URINE: Preg Test, Ur: NEGATIVE

## 2019-01-07 NOTE — ED Triage Notes (Signed)
Pt states she ate raw meat 2 days ago and had diarrhea yesterday while at work, [employer] requires note foe her to return

## 2019-01-07 NOTE — ED Triage Notes (Signed)
Pt also states that she is still experiencing abdominal pain and diarrhea

## 2019-01-07 NOTE — ED Provider Notes (Signed)
[facility]    CSN: [id number] Arrival date & time: 01/07/19  0930     History   Chief Complaint Chief Complaint  Patient presents with  . Letter for School/Work    HPI Sharon Barnett is a 28 y.o. female.   Patient is a 28 year old female that presents today with approximately 2 days of intermittent, waxing waning abdominal discomfort.  She describes as cramping.  She has had some associated diarrhea.  Stools have been soft and not watery.  She has not had any blood in her stool.  She reports this all started after eating some raw hamburger.  Other people were sick after eating the same meat.   She works at [employer].  They reported she needed a work note to return to work.  Denies any associated fevers, chills, body aches, night sweats, recent traveling, recent antibiotic use or recent sick contacts.  ROS per HPI      Past Medical History:  Diagnosis Date  . ADHD (attention deficit hyperactivity disorder)   . Boils   . Complication of anesthesia    Hypersensitive to anesthesia (wisdom teeth)  . Headache   . Hyperemesis gravidarum   . Vaginal Pap smear, abnormal     Patient Active Problem List   Diagnosis Date Noted  . Supervision of normal first pregnancy 12/03/2015  . Intrauterine pregnancy 12/01/2015  . Chlamydia infection 12/01/2015  . Severe hyperemesis gravidarum 12/01/2015  . Rash and nonspecific skin eruption 03/03/2014  . 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 ATTENTION DEFICIT, W/HYPERACTIVITY 11/01/2006    Past Surgical History:  Procedure Laterality Date  . WISDOM TOOTH EXTRACTION      OB History    Gravida  1   Para  0   Term  0   Preterm  0   AB  0   Living  0     SAB  0   TAB  0   Ectopic  0   Multiple  0   Live Births               Home Medications    Prior to Admission medications   Medication Sig Start Date End Date Taking? Authorizing Provider  ibuprofen (ADVIL,MOTRIN) 600 MG tablet Take 1 tablet (600 mg total)  by mouth every 6 (six) hours as needed. 06/25/17   Leftwich-Kirby, Wilmer Floor, CNM  zolpidem (AMBIEN CR) 6.25 MG CR tablet Take 1 tablet (6.25 mg total) by mouth at bedtime as needed for sleep. 07/11/16   Tillman Sers, DO    Family History Family History  Problem Relation Age of Onset  . Depression Mother   . Diabetes Mother   . Hypertension Mother   . Miscarriages / [place] Mother   . Asthma Sister   . Depression Sister   . Hearing loss Sister   . Birth defects Sister   . Hyperlipidemia Sister   . Hypertension Sister   . Miscarriages / Stillbirths Sister   . Asthma Maternal Grandmother   . Cancer Maternal Grandmother   . Diabetes Maternal Grandmother   . Hypertension Maternal Grandmother   . Stroke Maternal Grandmother   . Vision loss Maternal Grandmother   . Cancer Paternal Grandmother     Social History Social History   Tobacco Use  . Smoking status: Never Smoker  . Smokeless tobacco: Never Used  Substance Use Topics  . Alcohol use: No  . Drug use: No     Allergies   Patient has  no known allergies.   Review of Systems Review of Systems   Physical Exam Triage Vital Signs ED Triage Vitals  Enc Vitals Group     BP 01/07/19 0944 107/73     Pulse Rate 01/07/19 0944 81     Resp 01/07/19 0944 16     Temp 01/07/19 0944 98.5 F (36.9 C)     Temp src --      SpO2 01/07/19 0944 97 %     Weight --      Height --      Head Circumference --      Peak Flow --      Pain Score 01/07/19 0941 7     Pain Loc --      Pain Edu? --      Excl. in GC? --    No data found.  Updated Vital Signs BP 107/73   Pulse 81   Temp 98.5 F (36.9 C)   Resp 16   LMP 12/25/2018 (Approximate)   SpO2 97%   Visual Acuity Right Eye Distance:   Left Eye Distance:   Bilateral Distance:    Right Eye Near:   Left Eye Near:    Bilateral Near:     Physical Exam Vitals signs and nursing note reviewed.  Constitutional:      General: She is not in acute distress.     Appearance: Normal appearance. She is not ill-appearing, toxic-appearing or diaphoretic.  HENT:     Head: Normocephalic and atraumatic.     Nose: Nose normal.  Eyes:     Conjunctiva/sclera: Conjunctivae normal.  Neck:     Musculoskeletal: Normal range of motion.  Pulmonary:     Effort: Pulmonary effort is normal.  Abdominal:     General: There is no distension.     Palpations: Abdomen is soft.     Tenderness: There is abdominal tenderness. There is no guarding or rebound.  Musculoskeletal: Normal range of motion.  Skin:    General: Skin is warm and dry.  Neurological:     Mental Status: She is alert.  Psychiatric:        Mood and Affect: Mood normal.      UC Treatments / Results  Labs (all labs ordered are listed, but only abnormal results are displayed) Labs Reviewed  POCT URINALYSIS DIP (DEVICE) - Abnormal; Notable for the following components:      Result Value   Leukocytes,Ua SMALL (*)    All other components within normal limits  POC URINE PREG, ED  POCT PREGNANCY, URINE    EKG None  Radiology No results found.  Procedures Procedures (including critical care time)  Medications Ordered in UC Medications - No data to display  Initial Impression / Assessment and Plan / UC Course  I have reviewed the triage vital signs and the nursing notes.  Pertinent labs & imaging results that were available during my care of the patient were reviewed by me and considered in my medical decision making (see chart for details).     Pt is a 28 year old female generalized abdominal tenderness and 2 days of diarrhea post eating some suspicious hamburger meat. Most likely her symptoms are related to food poisoning. Her vital signs are stable and she is nontoxic or ill-appearing.  She is able to drink fluids. Instructed to stay hydrated and if her symptoms continue or worsen she will need to follow-up for recheck. Final Clinical Impressions(s) / UC Diagnoses   Final  diagnoses:  Food  poisoning     Discharge Instructions     I think this is related to food poisoning.  Nothing contagious Safe to return to work when you feel better.  Stay hydrated and if the diarrhea continues please return     ED Prescriptions    None     Controlled Substance Prescriptions [place] Controlled Substance Registry consulted? Not Applicable   Janace Aris, NP 01/07/19 1035

## 2019-01-07 NOTE — Discharge Instructions (Addendum)
I think this is related to food poisoning.  Nothing contagious Safe to return to work when you feel better.  Stay hydrated and if the diarrhea continues please return

## 2019-02-01 ENCOUNTER — Encounter (HOSPITAL_COMMUNITY): Payer: Self-pay | Admitting: Emergency Medicine

## 2019-02-01 ENCOUNTER — Emergency Department (HOSPITAL_COMMUNITY)
Admission: EM | Admit: 2019-02-01 | Discharge: 2019-02-01 | Disposition: A | Discharge status: Home / Self Care | Payer: Self-pay | Attending: Emergency Medicine | Admitting: Emergency Medicine

## 2019-02-01 DIAGNOSIS — K029 Dental caries, unspecified: Secondary | ICD-10-CM | POA: Insufficient documentation

## 2019-02-01 MED ORDER — PENICILLIN V POTASSIUM 500 MG PO TABS: 1000.0000 mg | ORAL_TABLET | Freq: Two times a day (BID) | ORAL | 0 refills | Status: DC

## 2019-02-01 MED ORDER — OXYCODONE HCL 5 MG PO TABS
10.0000 mg | ORAL_TABLET | Freq: Once | ORAL | Status: AC
  Administered 2019-02-01: 10 mg via ORAL
  Filled 2019-02-01: qty 2

## 2019-02-01 NOTE — ED Triage Notes (Signed)
Pt from came home d/t tooth pain [facility] . Onset  2 weeks ago progressing to neck pain 2 days ago and abscess onset 1 day ago.

## 2019-02-01 NOTE — Discharge Instructions (Addendum)
1. Medications: penicillin, usual home medications; alternate tylenol and ibuprofen for pain control 2. Treatment: rest, drink plenty of fluids, take medications as prescribed 3. Follow Up: Please followup with dentistry within 1 week for discussion of your diagnoses and further evaluation after today's visit; if you do not have a primary care doctor use the resource guide provided to find one; Return to the ER for high fevers, difficulty breathing, difficulty swallowing or other concerning symptoms

## 2019-02-01 NOTE — ED Provider Notes (Signed)
[facility]-EMERGENCY DEPT Provider Note   CSN: [id number] Arrival date & time: 02/01/19  0510    History   Chief Complaint Chief Complaint  Patient presents with  . Dental Pain    HPI Sharon Barnett is a 28 y.o. female with a hx of ADHD, boils,  presents to the Emergency Department complaining of gradual, persistent, progressively worsening left lower dental pain onset 2 weeks ago.  Pt reports pain is a 10/10 and shoots into her jaw and neck.  She reports full ROM of her neck without stiffness or fever.  She denies difficulty swallowing, speaking or opening her jaw.  She reports that yesterday she began to feel as if her face were swelling. She is concerned about an abscess.  Pt reports no dental care since 2014.  She reports using orajel without relief.  Eating, drinking and talking make her pain worse.  Nothing makes it better.  She denies fever, chills, posterior neck pain, chest pain, SOB, abd pain, N/V/D.         The history is provided by the patient and medical records. No language interpreter was used.    Past Medical History:  Diagnosis Date  . ADHD (attention deficit hyperactivity disorder)   . Boils   . Complication of anesthesia    Hypersensitive to anesthesia (wisdom teeth)  . Headache   . Hyperemesis gravidarum   . Vaginal Pap smear, abnormal     Patient Active Problem List   Diagnosis Date Noted  . Supervision of normal first pregnancy 12/03/2015  . Intrauterine pregnancy 12/01/2015  . Chlamydia infection 12/01/2015  . Severe hyperemesis gravidarum 12/01/2015  . Rash and nonspecific skin eruption 03/03/2014  . 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 ATTENTION DEFICIT, W/HYPERACTIVITY 11/01/2006    Past Surgical History:  Procedure Laterality Date  . WISDOM TOOTH EXTRACTION       OB History    Gravida  1   Para  0   Term  0   Preterm  0   AB  0   Living  0     SAB  0   TAB  0   Ectopic  0   Multiple  0   Live Births              Home Medications    Prior to Admission medications   Medication Sig Start Date End Date Taking? Authorizing Provider  ibuprofen (ADVIL,MOTRIN) 600 MG tablet Take 1 tablet (600 mg total) by mouth every 6 (six) hours as needed. 06/25/17   Leftwich-Kirby, Wilmer Floor, CNM  penicillin v potassium (VEETID) 500 MG tablet Take 2 tablets (1,000 mg total) by mouth 2 (two) times daily. X 7 days 02/01/19   Nygeria Lager, Dahlia Client, PA-C  zolpidem (AMBIEN CR) 6.25 MG CR tablet Take 1 tablet (6.25 mg total) by mouth at bedtime as needed for sleep. 07/11/16   Tillman Sers, DO    Family History Family History  Problem Relation Age of Onset  . Depression Mother   . Diabetes Mother   . Hypertension Mother   . Miscarriages / [place] Mother   . Asthma Sister   . Depression Sister   . Hearing loss Sister   . Birth defects Sister   . Hyperlipidemia Sister   . Hypertension Sister   . Miscarriages / Stillbirths Sister   . Asthma Maternal Grandmother   . Cancer Maternal Grandmother   . Diabetes Maternal Grandmother   . Hypertension Maternal Grandmother   .  Stroke Maternal Grandmother   . Vision loss Maternal Grandmother   . Cancer Paternal Grandmother     Social History Social History   Tobacco Use  . Smoking status: Never Smoker  . Smokeless tobacco: Never Used  Substance Use Topics  . Alcohol use: No  . Drug use: No     Allergies   Patient has no known allergies.   Review of Systems Review of Systems  Constitutional: Negative for appetite change, chills and fever.  HENT: Positive for dental problem and ear pain. Negative for drooling, nosebleeds, postnasal drip, rhinorrhea and trouble swallowing.   Eyes: Negative for pain and redness.  Respiratory: Negative for cough and wheezing.   Cardiovascular: Negative for chest pain.  Gastrointestinal: Negative for abdominal pain, nausea and vomiting.  Musculoskeletal: Negative for neck pain and neck stiffness.  Skin: Negative  for color change and rash.  Neurological: Positive for headaches. Negative for weakness and light-headedness.  All other systems reviewed and are negative.    Physical Exam Updated Vital Signs BP 111/69 (BP Location: Right Arm)   Pulse 83   Temp 98.7 F (37.1 C) (Oral)   Resp 17   LMP 01/27/2019   SpO2 98%   Physical Exam Vitals signs and nursing note reviewed.  Constitutional:      Appearance: She is well-developed.  HENT:     Head: Normocephalic.     Right Ear: External ear normal.     Left Ear: External ear normal.     Nose: Nose normal.     Right Sinus: No maxillary sinus tenderness or frontal sinus tenderness.     Left Sinus: No maxillary sinus tenderness or frontal sinus tenderness.     Mouth/Throat:     Mouth: No lacerations or oral lesions.     Dentition: Abnormal dentition. Dental caries present.     Pharynx: Uvula midline. No oropharyngeal exudate, posterior oropharyngeal erythema or uvula swelling.     Tonsils: No tonsillar abscesses.     Comments: Sublingual tissues without edema or induration. No protrusion of the tongue.  No visible swelling of the face. No trismus.  Eyes:     General:        Right eye: No discharge.        Left eye: No discharge.     Conjunctiva/sclera: Conjunctivae normal.  Neck:     Musculoskeletal: Normal range of motion and neck supple.     Comments: No stridor Handling secretions without difficulty No nuchal rigidity No cervical lymphadenopathy Cardiovascular:     Rate and Rhythm: Normal rate and regular rhythm.  Pulmonary:     Effort: Pulmonary effort is normal. No respiratory distress.  Abdominal:     General: There is no distension.  Lymphadenopathy:     Head:     Right side of head: No submental, submandibular, tonsillar, preauricular or posterior auricular adenopathy.     Left side of head: Submandibular adenopathy present. No submental, tonsillar, preauricular or posterior auricular adenopathy.     Cervical: No cervical  adenopathy.     Right cervical: No superficial cervical adenopathy.    Left cervical: No superficial cervical adenopathy.  Skin:    General: Skin is warm and dry.  Neurological:     Mental Status: She is alert.      ED Treatments / Results   Procedures Procedures (including critical care time)  Medications Ordered in ED Medications  oxyCODONE (Oxy IR/ROXICODONE) immediate release tablet 10 mg (10 mg Oral Given 02/01/19 0554)  Initial Impression / Assessment and Plan / ED Course  I have reviewed the triage vital signs and the nursing notes.  Pertinent labs & imaging results that were available during my care of the patient were reviewed by me and considered in my medical decision making (see chart for details).        Patient with toothache.  No gross abscess.  Exam unconcerning for Ludwig's angina or spread of infection.  Will treat with penicillin and anti-inflammatory medicine.  Pain medication given in ED.  Pt offered dental block but refused stating she does not like needles. Urged patient to follow-up with dentist.     Final Clinical Impressions(s) / ED Diagnoses   Final diagnoses:  Pain due to dental caries    ED Discharge Orders         Ordered    penicillin v potassium (VEETID) 500 MG tablet  2 times daily     02/01/19 0549           Tiara Maultsby, Dahlia ClientHannah, PA-C 02/01/19 0609    Molpus, Jonny RuizJohn, MD 02/01/19 [phone]

## 2019-02-04 ENCOUNTER — Encounter (HOSPITAL_COMMUNITY): Payer: Self-pay | Admitting: Emergency Medicine

## 2019-02-04 ENCOUNTER — Emergency Department (HOSPITAL_COMMUNITY)
Admission: EM | Admit: 2019-02-04 | Discharge: 2019-02-04 | Disposition: A | Discharge status: Home / Self Care | Payer: Self-pay | Attending: Emergency Medicine | Admitting: Emergency Medicine

## 2019-02-04 DIAGNOSIS — R112 Nausea with vomiting, unspecified: Secondary | ICD-10-CM | POA: Insufficient documentation

## 2019-02-04 LAB — I-STAT CHEM 8, ED
BUN: 16 mg/dL (ref 6–20)
Calcium, Ion: 1.08 mmol/L — ABNORMAL LOW (ref 1.15–1.40)
Chloride: 106 mmol/L (ref 98–111)
Creatinine, Ser: 0.7 mg/dL (ref 0.44–1.00)
Glucose, Bld: 63 mg/dL — ABNORMAL LOW (ref 70–99)
HCT: 37 % (ref 36.0–46.0)
Hemoglobin: 12.6 g/dL (ref 12.0–15.0)
Potassium: 3.8 mmol/L (ref 3.5–5.1)
Sodium: 139 mmol/L (ref 135–145)
TCO2: 22 mmol/L (ref 22–32)

## 2019-02-04 LAB — I-STAT TROPONIN, ED: Troponin i, poc: 0 ng/mL (ref 0.00–0.08)

## 2019-02-04 LAB — I-STAT BETA HCG BLOOD, ED (MC, WL, AP ONLY): I-stat hCG, quantitative: <5 m[IU]/mL (ref <5)

## 2019-02-04 MED ORDER — PROMETHAZINE HCL 25 MG PO TABS: 25.0000 mg | ORAL_TABLET | Freq: Four times a day (QID) | ORAL | 0 refills | Status: DC | PRN

## 2019-02-04 MED ORDER — ONDANSETRON HCL 4 MG/2ML IJ SOLN
4.0000 mg | Freq: Once | INTRAMUSCULAR | Status: AC
Start: 2019-02-04 — End: 2019-02-04
  Administered 2019-02-04: 4 mg via INTRAVENOUS
  Filled 2019-02-04: qty 2

## 2019-02-04 MED ORDER — SODIUM CHLORIDE 0.9 % IV BOLUS
1000.0000 mL | Freq: Once | INTRAVENOUS | Status: AC
  Administered 2019-02-04: 1000 mL via INTRAVENOUS

## 2019-02-04 NOTE — ED Notes (Signed)
Bed: WA23 Expected date:  Expected time:  Means of arrival:  Comments: 

## 2019-02-04 NOTE — ED Provider Notes (Signed)
[facility]-EMERGENCY DEPT Provider Note   CSN: [id number] Arrival date & time: 02/04/19  0907    History   Chief Complaint Chief Complaint  Patient presents with  . Nausea    HPI Sharon Barnett is a 28 y.o. female.     HPI Patient presents to the emergency department with nausea and vomiting following the use of antibiotics.  The patient states she is on antibiotics for dental infection.  Patient states that seems make the condition better or worse.  The patient states that she did not take any medications otherwise prior to arrival.  The patient denies chest pain, shortness of breath, headache,blurred vision, neck pain, fever, cough, weakness, numbness, dizziness, anorexia, edema, abdominal pain, diarrhea, rash, back pain, dysuria, hematemesis, bloody stool, near syncope, or syncope. Past Medical History:  Diagnosis Date  . ADHD (attention deficit hyperactivity disorder)   . Boils   . Complication of anesthesia    Hypersensitive to anesthesia (wisdom teeth)  . Headache   . Hyperemesis gravidarum   . Vaginal Pap smear, abnormal     Patient Active Problem List   Diagnosis Date Noted  . Supervision of normal first pregnancy 12/03/2015  . Intrauterine pregnancy 12/01/2015  . Chlamydia infection 12/01/2015  . Severe hyperemesis gravidarum 12/01/2015  . Rash and nonspecific skin eruption 03/03/2014  . 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 ATTENTION DEFICIT, W/HYPERACTIVITY 11/01/2006    Past Surgical History:  Procedure Laterality Date  . WISDOM TOOTH EXTRACTION       OB History    Gravida  1   Para  0   Term  0   Preterm  0   AB  0   Living  0     SAB  0   TAB  0   Ectopic  0   Multiple  0   Live Births               Home Medications    Prior to Admission medications   Medication Sig Start Date End Date Taking? Authorizing Provider  benzocaine (ORAJEL) 10 % mucosal gel Use as directed 1 application in the mouth or throat as  needed for mouth pain.   Yes [provider]  penicillin v potassium (VEETID) 500 MG tablet Take 2 tablets (1,000 mg total) by mouth 2 (two) times daily. X 7 days 02/01/19  Yes Muthersbaugh, Dahlia Client, PA-C  ibuprofen (ADVIL,MOTRIN) 600 MG tablet Take 1 tablet (600 mg total) by mouth every 6 (six) hours as needed. Patient not taking: Reported on 02/04/2019 06/25/17   Hurshel Party, CNM  zolpidem (AMBIEN CR) 6.25 MG CR tablet Take 1 tablet (6.25 mg total) by mouth at bedtime as needed for sleep. Patient not taking: Reported on 02/04/2019 07/11/16   Tillman Sers, DO    Family History Family History  Problem Relation Age of Onset  . Depression Mother   . Diabetes Mother   . Hypertension Mother   . Miscarriages / [place] Mother   . Asthma Sister   . Depression Sister   . Hearing loss Sister   . Birth defects Sister   . Hyperlipidemia Sister   . Hypertension Sister   . Miscarriages / Stillbirths Sister   . Asthma Maternal Grandmother   . Cancer Maternal Grandmother   . Diabetes Maternal Grandmother   . Hypertension Maternal Grandmother   . Stroke Maternal Grandmother   . Vision loss Maternal Grandmother   . Cancer Paternal Grandmother  Social History Social History   Tobacco Use  . Smoking status: Never Smoker  . Smokeless tobacco: Never Used  Substance Use Topics  . Alcohol use: No  . Drug use: No     Allergies   Patient has no known allergies.   Review of Systems Review of Systems All other systems negative except as documented in the HPI. All pertinent positives and negatives as reviewed in the HPI.  Physical Exam Updated Vital Signs BP 127/73 (BP Location: Left Arm)   Pulse 85   Temp 98.4 F (36.9 C) (Oral)   Resp 18   LMP 01/27/2019   SpO2 98%   Physical Exam Vitals signs and nursing note reviewed.  Constitutional:      General: She is not in acute distress.    Appearance: She is well-developed.  HENT:     Head: Normocephalic and  atraumatic.  Eyes:     Pupils: Pupils are equal, round, and reactive to light.  Neck:     Musculoskeletal: Normal range of motion and neck supple.  Cardiovascular:     Rate and Rhythm: Normal rate and regular rhythm.     Heart sounds: Normal heart sounds. No murmur. No friction rub. No gallop.   Pulmonary:     Effort: Pulmonary effort is normal. No respiratory distress.     Breath sounds: Normal breath sounds. No wheezing.  Abdominal:     General: Bowel sounds are normal. There is no distension.     Palpations: Abdomen is soft.     Tenderness: There is no abdominal tenderness.  Skin:    General: Skin is warm and dry.     Capillary Refill: Capillary refill takes less than 2 seconds.     Findings: No erythema or rash.  Neurological:     Mental Status: She is alert and oriented to person, place, and time.     Motor: No abnormal muscle tone.     Coordination: Coordination normal.  Psychiatric:        Behavior: Behavior normal.      ED Treatments / Results  Labs (all labs ordered are listed, but only abnormal results are displayed) Labs Reviewed  I-STAT CHEM 8, ED - Abnormal; Notable for the following components:      Result Value   Glucose, Bld 63 (*)    Calcium, Ion 1.08 (*)    All other components within normal limits  I-STAT BETA HCG BLOOD, ED ([facility], [facility], [facility] ONLY)  I-STAT TROPONIN, ED    EKG None  Radiology No results found.  Procedures Procedures (including critical care time)  Medications Ordered in ED Medications  sodium chloride 0.9 % bolus 1,000 mL (0 mLs Intravenous Stopped 02/04/19 1401)  ondansetron (ZOFRAN) injection 4 mg (4 mg Intravenous Given 02/04/19 1103)     Initial Impression / Assessment and Plan / ED Course  I have reviewed the triage vital signs and the nursing notes.  Pertinent labs & imaging results that were available during my care of the patient were reviewed by me and considered in my medical decision making (see chart for details).         Patient be treated for the nausea vomiting and she was given IV fluids here in the emergency department.  I advised the patient to return here as needed patient be sent home with antiemetics. Final Clinical Impressions(s) / ED Diagnoses   Final diagnoses:  None    ED Discharge Orders    None  Charlestine Night, PA-C 02/04/19 1411    Arby Barrette, MD 02/11/19 1451

## 2019-02-04 NOTE — ED Notes (Signed)
Patient given discharge teaching and verbalized understanding. Patient ambulated out of ED with a steady gait. 

## 2019-02-04 NOTE — ED Triage Notes (Signed)
Per EMS-states she has been on antibiotics for dental pain-states meds causing nausea and vomiting-seen on 5/30-

## 2019-02-04 NOTE — ED Notes (Signed)
Patient was able to take sips of water.

## 2019-02-04 NOTE — Discharge Instructions (Addendum)
Return here as needed.  Follow-up with your doctor.  I would make sure that you are taking the antibiotics with some food on your stomach and follow-up with a dentist as soon as possible.

## 2019-05-20 ENCOUNTER — Ambulatory Visit (INDEPENDENT_AMBULATORY_CARE_PROVIDER_SITE_OTHER)
Admission: RE | Admit: 2019-05-20 | Discharge: 2019-05-20 | Disposition: A | Discharge status: Home / Self Care | Payer: Self-pay | Source: Ambulatory Visit

## 2019-05-20 DIAGNOSIS — N898 Other specified noninflammatory disorders of vagina: Secondary | ICD-10-CM

## 2019-05-20 MED ORDER — METRONIDAZOLE 500 MG PO TABS: 500.0000 mg | ORAL_TABLET | Freq: Two times a day (BID) | ORAL | 0 refills | Status: DC

## 2019-05-20 NOTE — Discharge Instructions (Signed)
Treating you for Bacterial Vaginosis If symptoms worsen or do not improve please come in to be seen in person

## 2019-05-20 NOTE — ED Provider Notes (Signed)
Virtual Visit via Video Note:  [name]  initiated request for Telemedicine visit with [facility] team. I connected with [name]  on 05/20/2019 at 8:36 AM  for a synchronized telemedicine visit using a video enabled HIPPA compliant telemedicine application. I verified that I am speaking with [name]  using two identifiers. Orvan July, NP  was physically located in a [facility] Urgent care site and [name] was located at a different location.   The limitations of evaluation and management by telemedicine as well as the availability of in-person appointments were discussed. Patient was informed that she  may incur a bill ( including co-pay) for this virtual visit encounter. [name]  expressed understanding and gave verbal consent to proceed with virtual visit.     History of Present Illness:Sharon Barnett  is a 28 y.o. female presents with vaginal odor and mild lower abdominal cramping. Symptoms have been constant, waxing and waning over the past 2 weeks. She is currently sexual active with one partner.  Last menstrual period was 05/12/2019.  History of bacterial vaginosis in the past.  Denies any specific concerns for STDs today. No associated dysuria, hematuria or urinary frequency.  Denies any fever, chills, body aches or back pain.   Past Medical History:  Diagnosis Date  . ADHD (attention deficit hyperactivity disorder)   . Boils   . Complication of anesthesia    Hypersensitive to anesthesia (wisdom teeth)  . Headache   . Hyperemesis gravidarum   . Vaginal Pap smear, abnormal     No Known Allergies      Observations/Objective:VITALS: Per patient if applicable, see vitals. GENERAL: Alert, appears well and in no acute distress. HEENT: Atraumatic, conjunctiva clear, no obvious abnormalities on inspection of external nose and ears. NECK: Normal movements of the head and neck. CARDIOPULMONARY: No increased WOB. Speaking in clear  sentences. I:E ratio WNL.  MS: Moves all visible extremities without noticeable abnormality. PSYCH: Pleasant and cooperative, well-groomed. Speech normal rate and rhythm. Affect is appropriate. Insight and judgement are appropriate. Attention is focused, linear, and appropriate.  NEURO: CN grossly intact. Oriented as arrived to appointment on time with no prompting. Moves both UE equally.  SKIN: No obvious lesions, wounds, erythema, or cyanosis noted on face or hands.     Assessment and Plan: Treating for bacterial vaginosis based on symptoms with Flagyl twice a day for 7 days   Follow Up Instructions: Recommended follow-up in person for continued or worsening symptoms    I discussed the assessment and treatment plan with the patient. The patient was provided an opportunity to ask questions and all were answered. The patient agreed with the plan and demonstrated an understanding of the instructions.   The patient was advised to call back or seek an in-person evaluation if the symptoms worsen or if the condition fails to improve as anticipated.    Orvan July, NP  05/20/2019 8:36 AM         Orvan July, NP 05/20/19 [phone]

## 2020-03-30 ENCOUNTER — Ambulatory Visit (INDEPENDENT_AMBULATORY_CARE_PROVIDER_SITE_OTHER)
Admission: RE | Admit: 2020-03-30 | Discharge: 2020-03-30 | Disposition: A | Discharge status: Other Acute Inpatient Hospital | Payer: Self-pay | Source: Ambulatory Visit

## 2020-03-30 ENCOUNTER — Other Ambulatory Visit: Payer: Self-pay

## 2020-03-30 DIAGNOSIS — R1030 Lower abdominal pain, unspecified: Secondary | ICD-10-CM

## 2020-03-30 DIAGNOSIS — M5442 Lumbago with sciatica, left side: Secondary | ICD-10-CM

## 2020-03-30 NOTE — Discharge Instructions (Signed)
Come to the Urgent Care for in-person evaluation of your abdominal and back pain.

## 2020-03-30 NOTE — ED Provider Notes (Signed)
Virtual Visit via Video Note:  Sharon Barnett  initiated request for Telemedicine visit with [facility] Urgent [facility] team. I connected with Malon C Sing  on 03/30/2020 at 9:38 AM  for a synchronized telemedicine visit using a video enabled HIPPA compliant telemedicine application. I verified that I am speaking with Saphira C Gholson  using two identifiers. Mickie Bail, NP  was physically located in a [facility] Urgent care site and Shawanna Zanders Martino was located at a different location.   The limitations of evaluation and management by telemedicine as well as the availability of in-person appointments were discussed. Patient was informed that she  may incur a bill ( including co-pay) for this virtual visit encounter. Skylinn C Amato  expressed understanding and gave verbal consent to proceed with virtual visit.     History of Present Illness:Sharon Barnett  is a 29 y.o. female presents for evaluation of back pain x 2 weeks.  She also reports sharp suprapubic abdominal pain and white vaginal discharge.  She denies fever, chills, vomiting, diarrhea, constipation, dysuria, pelvic pain, or other symptoms.  She is currently menstruating.     No Known Allergies   Past Medical History:  Diagnosis Date  . ADHD (attention deficit hyperactivity disorder)   . Boils   . Complication of anesthesia    Hypersensitive to anesthesia (wisdom teeth)  . Headache   . Hyperemesis gravidarum   . Vaginal Pap smear, abnormal      Social History   Tobacco Use  . Smoking status: Never Smoker  . Smokeless tobacco: Never Used  Substance Use Topics  . Alcohol use: No  . Drug use: No   ROS: as stated in HPI.  All other systems reviewed and negative.      Observations/Objective: Physical Exam  VITALS: Patient denies fever. GENERAL: Alert, appears well and in no acute distress. HEENT: Atraumatic. Oral mucosa appears moist. NECK: Normal movements of the head and neck. CARDIOPULMONARY: No increased  WOB. Speaking in clear sentences. I:E ratio WNL.  MS: Moves all visible extremities without noticeable abnormality. PSYCH: Pleasant and cooperative, well-groomed. Speech normal rate and rhythm. Affect is appropriate. Insight and judgement are appropriate. Attention is focused, linear, and appropriate.  NEURO: CN grossly intact. Oriented as arrived to appointment on time with no prompting. Moves both UE equally.  SKIN: No obvious lesions, wounds, erythema, or cyanosis noted on face or hands.   Assessment and Plan:    ICD-10-CM   1. Lower abdominal pain  R10.30   2. Acute bilateral low back pain with left-sided sciatica  M54.42        Follow Up Instructions: Instructed patient to come to the urgent care for in-person evaluation of her abdominal pain and back pain.  Patient agrees to plan of care.    I discussed the assessment and treatment plan with the patient. The patient was provided an opportunity to ask questions and all were answered. The patient agreed with the plan and demonstrated an understanding of the instructions.   The patient was advised to call back or seek an in-person evaluation if the symptoms worsen or if the condition fails to improve as anticipated.      Mickie Bail, NP  03/30/2020 9:38 AM         Mickie Bail, NP 03/30/20 [phone]

## 2020-05-10 ENCOUNTER — Other Ambulatory Visit: Payer: Self-pay

## 2020-05-10 ENCOUNTER — Encounter (HOSPITAL_COMMUNITY): Payer: Self-pay

## 2020-05-10 ENCOUNTER — Ambulatory Visit (HOSPITAL_COMMUNITY)
Admission: EM | Admit: 2020-05-10 | Discharge: 2020-05-10 | Disposition: A | Discharge status: Home / Self Care | Payer: HRSA Program | Attending: Family Medicine | Admitting: Family Medicine

## 2020-05-10 DIAGNOSIS — Z79899 Other long term (current) drug therapy: Secondary | ICD-10-CM | POA: Insufficient documentation

## 2020-05-10 DIAGNOSIS — J069 Acute upper respiratory infection, unspecified: Secondary | ICD-10-CM | POA: Diagnosis not present

## 2020-05-10 DIAGNOSIS — R0602 Shortness of breath: Secondary | ICD-10-CM | POA: Insufficient documentation

## 2020-05-10 DIAGNOSIS — R200 Anesthesia of skin: Secondary | ICD-10-CM | POA: Diagnosis not present

## 2020-05-10 DIAGNOSIS — U071 COVID-19: Secondary | ICD-10-CM | POA: Diagnosis not present

## 2020-05-10 DIAGNOSIS — R112 Nausea with vomiting, unspecified: Secondary | ICD-10-CM | POA: Diagnosis present

## 2020-05-10 MED ORDER — PROMETHAZINE HCL 25 MG PO TABS: 25.0000 mg | ORAL_TABLET | Freq: Four times a day (QID) | ORAL | 0 refills | Status: DC | PRN

## 2020-05-10 NOTE — ED Triage Notes (Signed)
Pt c/o N/Vx3 days. PT states was exposed to COVID + person. PT states vomited 12 times in past 24 hrs. Pt states she can't keep anything down.

## 2020-05-10 NOTE — ED Provider Notes (Signed)
[facility]    CSN: [id number] Arrival date & time: 05/10/20  1137      History   Chief Complaint Chief Complaint  Patient presents with  . Emesis    HPI Sharon Barnett is a 29 y.o. female.   Fever, chills, body aches and stiffness/numbness, N/V, mild cough, SOB on exertion for about 3 days now. Took tylenol with mild temporary relief yesterday. Unable to keep anything down so has not tried taking for sxs since. Denies wheezing, rashes, diarrhea, CP. Several sick contacts recently, and sister just tested positive for COVID today.      Past Medical History:  Diagnosis Date  . ADHD (attention deficit hyperactivity disorder)   . Boils   . Complication of anesthesia    Hypersensitive to anesthesia (wisdom teeth)  . Headache   . Hyperemesis gravidarum   . Vaginal Pap smear, abnormal     Patient Active Problem List   Diagnosis Date Noted  . Supervision of normal first pregnancy 12/03/2015  . Intrauterine pregnancy 12/01/2015  . Chlamydia infection 12/01/2015  . Severe hyperemesis gravidarum 12/01/2015  . Rash and nonspecific skin eruption 03/03/2014  . 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 ATTENTION DEFICIT, W/HYPERACTIVITY 11/01/2006    Past Surgical History:  Procedure Laterality Date  . WISDOM TOOTH EXTRACTION      OB History    Gravida  1   Para  0   Term  0   Preterm  0   AB  0   Living  0     SAB  0   TAB  0   Ectopic  0   Multiple  0   Live Births               Home Medications    Prior to Admission medications   Medication Sig Start Date End Date Taking? Authorizing Provider  benzocaine (ORAJEL) 10 % mucosal gel Use as directed 1 application in the mouth or throat as needed for mouth pain.    [provider]  metroNIDAZOLE (FLAGYL) 500 MG tablet Take 1 tablet (500 mg total) by mouth 2 (two) times daily. 05/20/19   Dahlia Byes A, NP  promethazine (PHENERGAN) 25 MG tablet Take 1 tablet (25 mg total) by mouth every 6  (six) hours as needed for nausea or vomiting. 05/10/20   Particia Nearing, PA-C  zolpidem (AMBIEN CR) 6.25 MG CR tablet Take 1 tablet (6.25 mg total) by mouth at bedtime as needed for sleep. Patient not taking: Reported on 02/04/2019 07/11/16 05/20/19  Tillman Sers, DO    Family History Family History  Problem Relation Age of Onset  . Depression Mother   . Diabetes Mother   . Hypertension Mother   . Miscarriages / [place] Mother   . Asthma Sister   . Depression Sister   . Hearing loss Sister   . Birth defects Sister   . Hyperlipidemia Sister   . Hypertension Sister   . Miscarriages / Stillbirths Sister   . Asthma Maternal Grandmother   . Cancer Maternal Grandmother   . Diabetes Maternal Grandmother   . Hypertension Maternal Grandmother   . Stroke Maternal Grandmother   . Vision loss Maternal Grandmother   . Cancer Paternal Grandmother     Social History Social History   Tobacco Use  . Smoking status: Never Smoker  . Smokeless tobacco: Never Used  Substance Use Topics  . Alcohol use: No  . Drug use: No  Allergies   Patient has no known allergies.   Review of Systems Review of Systems PER HPI   Physical Exam Triage Vital Signs ED Triage Vitals  Enc Vitals Group     BP 05/10/20 1355 (!) 142/99     Pulse Rate 05/10/20 1355 91     Resp 05/10/20 1355 16     Temp 05/10/20 1355 98.5 F (36.9 C)     Temp Source 05/10/20 1355 Oral     SpO2 05/10/20 1355 97 %     Weight 05/10/20 1356 142 lb (64.4 kg)     Height 05/10/20 1356 5\' 8"  (1.727 m)     Head Circumference --      Peak Flow --      Pain Score 05/10/20 1356 8     Pain Loc --      Pain Edu? --      Excl. in GC? --    No data found.  Updated Vital Signs BP (!) 142/99   Pulse 91   Temp 98.5 F (36.9 C) (Oral)   Resp 16   Ht 5\' 8"  (1.727 m)   Wt 142 lb (64.4 kg)   SpO2 97%   BMI 21.59 kg/m   Visual Acuity Right Eye Distance:   Left Eye Distance:   Bilateral Distance:    Right  Eye Near:   Left Eye Near:    Bilateral Near:     Physical Exam Vitals and nursing note reviewed.  Constitutional:      Appearance: Normal appearance.  HENT:     Head: Atraumatic.     Right Ear: Tympanic membrane and external ear normal.     Left Ear: Tympanic membrane and external ear normal.     Nose: Rhinorrhea present.     Mouth/Throat:     Mouth: Mucous membranes are moist.     Pharynx: Posterior oropharyngeal erythema present. No oropharyngeal exudate.  Eyes:     Extraocular Movements: Extraocular movements intact.     Conjunctiva/sclera: Conjunctivae normal.  Cardiovascular:     Rate and Rhythm: Normal rate and regular rhythm.     Heart sounds: Normal heart sounds.  Pulmonary:     Effort: Pulmonary effort is normal.     Breath sounds: Normal breath sounds. No wheezing.  Abdominal:     General: Bowel sounds are normal. There is no distension.     Palpations: Abdomen is soft.     Tenderness: There is abdominal tenderness (mild diffuse abdominal ttp). There is no right CVA tenderness, left CVA tenderness or guarding.  Musculoskeletal:        General: Normal range of motion.     Cervical back: Normal range of motion and neck supple.  Skin:    General: Skin is warm and dry.     Findings: No erythema or rash.  Neurological:     Mental Status: She is alert and oriented to person, place, and time.  Psychiatric:        Mood and Affect: Mood normal.        Thought Content: Thought content normal.    UC Treatments / Results  Labs (all labs ordered are listed, but only abnormal results are displayed) Labs Reviewed  SARS CORONAVIRUS 2 (TAT 6-24 HRS)    EKG   Radiology No results found.  Procedures Procedures (including critical care time)  Medications Ordered in UC Medications - No data to display  Initial Impression / Assessment and Plan / UC Course  I have  reviewed the triage vital signs and the nursing notes.  Pertinent labs & imaging results that were  available during my care of the patient were reviewed by me and considered in my medical decision making (see chart for details).     Very concerning for COVID 19 infection, await test results, discussed isolation protocol while awaiting results and if positive. Most concerning sxs is her nausea and vomiting, will rx phenergan (has done poorly with zofran in the past) and work on increasing PO intake. Discussed OTC fever and pain reducers, other symptomatic OTC medications. Strict return precautions given for worsening sxs.   Final Clinical Impressions(s) / UC Diagnoses   Final diagnoses:  Viral URI  Non-intractable vomiting with nausea, unspecified vomiting type   Discharge Instructions   None    ED Prescriptions    Medication Sig Dispense Auth. Provider   promethazine (PHENERGAN) 25 MG tablet Take 1 tablet (25 mg total) by mouth every 6 (six) hours as needed for nausea or vomiting. 30 tablet Particia Nearing, [place]     PDMP not reviewed this encounter.   Particia Nearing, [place] 05/10/20 1454

## 2020-05-10 NOTE — ED Notes (Signed)
Attempted to call pt to come in for triage and there was no answer.

## 2020-05-11 LAB — SARS CORONAVIRUS 2 (TAT 6-24 HRS): SARS Coronavirus 2: POSITIVE — AB

## 2021-11-06 ENCOUNTER — Emergency Department (HOSPITAL_COMMUNITY): Payer: Medicaid Other

## 2021-11-06 ENCOUNTER — Encounter (HOSPITAL_COMMUNITY): Payer: Self-pay | Admitting: Emergency Medicine

## 2021-11-06 ENCOUNTER — Other Ambulatory Visit: Payer: Self-pay

## 2021-11-06 ENCOUNTER — Emergency Department (HOSPITAL_COMMUNITY)
Admission: EM | Admit: 2021-11-06 | Discharge: 2021-11-06 | Disposition: A | Discharge status: Home / Self Care | Payer: Medicaid Other | Attending: Emergency Medicine | Admitting: Emergency Medicine

## 2021-11-06 DIAGNOSIS — R112 Nausea with vomiting, unspecified: Secondary | ICD-10-CM | POA: Diagnosis not present

## 2021-11-06 DIAGNOSIS — R1084 Generalized abdominal pain: Secondary | ICD-10-CM | POA: Diagnosis not present

## 2021-11-06 DIAGNOSIS — N200 Calculus of kidney: Secondary | ICD-10-CM | POA: Diagnosis not present

## 2021-11-06 DIAGNOSIS — R197 Diarrhea, unspecified: Secondary | ICD-10-CM

## 2021-11-06 DIAGNOSIS — R1111 Vomiting without nausea: Secondary | ICD-10-CM | POA: Diagnosis not present

## 2021-11-06 DIAGNOSIS — K529 Noninfective gastroenteritis and colitis, unspecified: Secondary | ICD-10-CM

## 2021-11-06 LAB — URINALYSIS, ROUTINE W REFLEX MICROSCOPIC
Bacteria, UA: NONE SEEN
Bilirubin Urine: NEGATIVE
Glucose, UA: NEGATIVE mg/dL
Ketones, ur: 80 mg/dL — AB
Leukocytes,Ua: NEGATIVE
Nitrite: NEGATIVE
Protein, ur: 30 mg/dL — AB
Specific Gravity, Urine: >1.046 — ABNORMAL HIGH (ref 1.005–1.030)
pH: 5 (ref 5.0–8.0)

## 2021-11-06 LAB — COMPREHENSIVE METABOLIC PANEL
ALT: 19 U/L (ref 0–44)
AST: 21 U/L (ref 15–41)
Albumin: 4.8 g/dL (ref 3.5–5.0)
Alkaline Phosphatase: 56 U/L (ref 38–126)
Anion gap: 14 (ref 5–15)
BUN: 20 mg/dL (ref 6–20)
CO2: 20 mmol/L — ABNORMAL LOW (ref 22–32)
Calcium: 9.1 mg/dL (ref 8.9–10.3)
Chloride: 103 mmol/L (ref 98–111)
Creatinine, Ser: 0.87 mg/dL (ref 0.44–1.00)
GFR, Estimated: >60 mL/min (ref >60)
Glucose, Bld: 113 mg/dL — ABNORMAL HIGH (ref 70–99)
Potassium: 3.6 mmol/L (ref 3.5–5.1)
Sodium: 137 mmol/L (ref 135–145)
Total Bilirubin: 0.8 mg/dL (ref 0.3–1.2)
Total Protein: 8.7 g/dL — ABNORMAL HIGH (ref 6.5–8.1)

## 2021-11-06 LAB — CBC
HCT: 39.4 % (ref 36.0–46.0)
Hemoglobin: 13.4 g/dL (ref 12.0–15.0)
MCH: 30.7 pg (ref 26.0–34.0)
MCHC: 34 g/dL (ref 30.0–36.0)
MCV: 90.4 fL (ref 80.0–100.0)
Platelets: 270 10*3/uL (ref 150–400)
RBC: 4.36 MIL/uL (ref 3.87–5.11)
RDW: 12.2 % (ref 11.5–15.5)
WBC: 14.8 10*3/uL — ABNORMAL HIGH (ref 4.0–10.5)
nRBC: 0 % (ref 0.0–0.2)

## 2021-11-06 LAB — HCG, QUANTITATIVE, PREGNANCY: hCG, Beta Chain, Quant, S: <1 m[IU]/mL (ref <5)

## 2021-11-06 LAB — LIPASE, BLOOD: Lipase: 27 U/L (ref 11–51)

## 2021-11-06 MED ORDER — SODIUM CHLORIDE 0.9 % IV BOLUS
1000.0000 mL | Freq: Once | INTRAVENOUS | Status: AC
  Administered 2021-11-06: 1000 mL via INTRAVENOUS

## 2021-11-06 MED ORDER — DICYCLOMINE HCL 20 MG PO TABS: 20.0000 mg | ORAL_TABLET | Freq: Two times a day (BID) | ORAL | 0 refills | Status: DC

## 2021-11-06 MED ORDER — DICYCLOMINE HCL 10 MG/ML IM SOLN
20.0000 mg | Freq: Once | INTRAMUSCULAR | Status: AC
  Administered 2021-11-06: 20 mg via INTRAMUSCULAR
  Filled 2021-11-06: qty 2

## 2021-11-06 MED ORDER — METOCLOPRAMIDE HCL 5 MG/ML IJ SOLN
10.0000 mg | Freq: Once | INTRAMUSCULAR | Status: AC
  Administered 2021-11-06: 10 mg via INTRAVENOUS
  Filled 2021-11-06: qty 2

## 2021-11-06 MED ORDER — SODIUM CHLORIDE 0.9% FLUSH
3.0000 mL | Freq: Once | INTRAVENOUS | Status: AC
  Administered 2021-11-06: 3 mL via INTRAVENOUS

## 2021-11-06 MED ORDER — IOHEXOL 300 MG/ML  SOLN
100.0000 mL | Freq: Once | INTRAMUSCULAR | Status: AC | PRN
  Administered 2021-11-06: 100 mL via INTRAVENOUS

## 2021-11-06 MED ORDER — ONDANSETRON HCL 4 MG/2ML IJ SOLN
4.0000 mg | Freq: Once | INTRAMUSCULAR | Status: AC
  Administered 2021-11-06: 4 mg via INTRAVENOUS
  Filled 2021-11-06: qty 2

## 2021-11-06 MED ORDER — ONDANSETRON 4 MG PO TBDP: 4.0000 mg | ORAL_TABLET | Freq: Three times a day (TID) | ORAL | 0 refills | Status: DC | PRN

## 2021-11-06 NOTE — Discharge Instructions (Addendum)
Your workup was overall reassuring without any acute findings on CT scan or labwork.  ? ?Your symptoms are likely related to a viral illness. Please pick up medications from pharmacy and take as needed. Drink plenty of fluids to stay hydrated.  ? ?It is recommended that you follow a bland diet over the course of the next few days (see attached). ? ?Ramp up your diet as tolerated.  ? ?Follow up with your PCP for further evaluation. If you do not have one you can follow up with [facility] for primary care needs.  ? ?Return to the ED for any new/worsening symptoms ?

## 2021-11-06 NOTE — ED Notes (Signed)
Patient resting, respirations even and unlabored, in NAD at this time. ?

## 2021-11-06 NOTE — ED Provider Notes (Signed)
[facility]   CSN: [id number] Arrival date & time: 11/06/21  0142     History  Chief Complaint  Patient presents with   Nausea   Emesis   Diarrhea    Sharon Barnett is a 31 y.o. female who presents to the ED today via EMS with complaint of nausea, NBNB emesis, and diarrhea that began yesterday morning.  Patient endorses the last thing she ate was 2 chicken wings the night before.  She states that afterwards she did not have much of an appetite and cannot eat anything else.  The next morning she woke up with symptoms.  She reports that her sister ate the chicken wings as well and is not having any symptoms.  She denies any recent sick contacts.  States that she has been unable to keep anything down prompting 911 call.  She states whenever she vomits she gets hot however denies any fevers or chills.  Per triage report patient reported that there is a chance she may be pregnant however she tells me she started her menstrual cycle last night.  She states that she was asked by EMS if there is a chance she could be pregnant she answered yes to the question despite having started her menstrual cycle.  She also mentions that she is having some urinary frequency.  Denies history of same.  No previous abdominal surgeries.  The history is provided by the patient and medical records.      Home Medications Prior to Admission medications   Medication Sig Start Date End Date Taking? Authorizing Provider  dicyclomine (BENTYL) 20 MG tablet Take 1 tablet (20 mg total) by mouth 2 (two) times daily. 11/06/21  Yes Ragina Fenter, PA-C  ondansetron (ZOFRAN-ODT) 4 MG disintegrating tablet Take 1 tablet (4 mg total) by mouth every 8 (eight) hours as needed for nausea or vomiting. 11/06/21  Yes Graciella Arment, PA-C  zolpidem (AMBIEN CR) 6.25 MG CR tablet Take 1 tablet (6.25 mg total) by mouth at bedtime as needed for sleep. Patient not taking: Reported on 02/04/2019  07/11/16 05/20/19  Tillman Sers, DO      Allergies    Patient has no known allergies.    Review of Systems   Review of Systems  Constitutional:  Negative for chills and fever.  Gastrointestinal:  Positive for abdominal pain, diarrhea, nausea and vomiting.  Genitourinary:  Positive for frequency. Negative for menstrual problem.  All other systems reviewed and are negative.  Physical Exam Updated Vital Signs BP (!) 102/52 (BP Location: Right Arm)    Pulse (!) 57    Temp 98.3 F (36.8 C) (Oral)    Resp 18    Ht 5\' 7"  (1.702 m)    Wt 60.8 kg    SpO2 99%    BMI 20.99 kg/m  Physical Exam Vitals and nursing note reviewed.  Constitutional:      Appearance: She is not ill-appearing.     Comments: Actively vomiting  HENT:     Head: Normocephalic and atraumatic.     Mouth/Throat:     Mouth: Mucous membranes are dry.  Eyes:     Conjunctiva/sclera: Conjunctivae normal.  Cardiovascular:     Rate and Rhythm: Normal rate and regular rhythm.  Pulmonary:     Effort: Pulmonary effort is normal.     Breath sounds: Normal breath sounds. No wheezing, rhonchi or rales.  Abdominal:     Palpations: Abdomen is soft.  Tenderness: There is abdominal tenderness. There is no guarding or rebound.     Comments: Soft, diffuse abdominal TTP, +BS throughout, no r/g/r, neg murphy's, neg mcburney's, no CVA TTP  Musculoskeletal:     Cervical back: Neck supple.  Skin:    General: Skin is warm and dry.  Neurological:     Mental Status: She is alert.    ED Results / Procedures / Treatments   Labs (all labs ordered are listed, but only abnormal results are displayed) Labs Reviewed  COMPREHENSIVE METABOLIC PANEL - Abnormal; Notable for the following components:      Result Value   CO2 20 (*)    Glucose, Bld 113 (*)    Total Protein 8.7 (*)    All other components within normal limits  CBC - Abnormal; Notable for the following components:   WBC 14.8 (*)    All other components within normal limits   URINALYSIS, ROUTINE W REFLEX MICROSCOPIC - Abnormal; Notable for the following components:   Specific Gravity, Urine >1.046 (*)    Hgb urine dipstick LARGE (*)    Ketones, ur 80 (*)    Protein, ur 30 (*)    All other components within normal limits  LIPASE, BLOOD  HCG, QUANTITATIVE, PREGNANCY    EKG None  Radiology CT Abdomen Pelvis W Contrast  Result Date: 11/06/2021 CLINICAL DATA:  Nausea, vomiting, and diarrhea since yesterday. EXAM: CT ABDOMEN AND PELVIS WITH CONTRAST TECHNIQUE: Multidetector CT imaging of the abdomen and pelvis was performed using the standard protocol following bolus administration of intravenous contrast. RADIATION DOSE REDUCTION: This exam was performed according to the departmental dose-optimization program which includes automated exposure control, adjustment of the mA and/or kV according to patient size and/or use of iterative reconstruction technique. CONTRAST:  OMNIPAQUE IOHEXOL 300 MG/ML  SOLN COMPARISON:  None. FINDINGS: Lower chest: No acute abnormality. Hepatobiliary: No focal liver abnormality is seen. No gallstones, gallbladder wall thickening, or biliary dilatation. Pancreas: Unremarkable. No pancreatic ductal dilatation or surrounding inflammatory changes. Spleen: Normal in size without focal abnormality. Adrenals/Urinary Tract: The adrenal glands are within normal limits. The kidneys enhance symmetrically. Evaluation for renal calculus is limited due the presence of excreted contrast at the renal pyramids. No hydronephrosis. The bladder is unremarkable. Stomach/Bowel: Stomach is within normal limits. Appendix appears normal. No evidence of bowel wall thickening, distention, or inflammatory changes. No free air or pneumatosis. Vascular/Lymphatic: No significant vascular findings are present. No enlarged abdominal or pelvic lymph nodes. Reproductive: A 1 cm hypodensity is present in the uterine fundus, possible fibroid. There is a cystic structure with a  hyperdense rim in the left ovary measuring 1.5 cm, likely hemorrhagic or corpus luteal cyst. No adnexal mass on the right. Other: No abdominal wall hernia or abnormality. No abdominopelvic ascites. Musculoskeletal: No acute or significant osseous findings. IMPRESSION: No bowel obstruction or free air.  Normal appendix. Electronically Signed   By: Thornell Sartorius M.D.   On: 11/06/2021 03:32    Procedures Procedures    Medications Ordered in ED Medications  sodium chloride flush (NS) 0.9 % injection 3 mL (3 mLs Intravenous Given 11/06/21 0156)  sodium chloride 0.9 % bolus 1,000 mL (0 mLs Intravenous Stopped 11/06/21 0349)  ondansetron (ZOFRAN) injection 4 mg (4 mg Intravenous Given 11/06/21 0211)  dicyclomine (BENTYL) injection 20 mg (20 mg Intramuscular Given 11/06/21 0301)  iohexol (OMNIPAQUE) 300 MG/ML solution 100 mL (100 mLs Intravenous Contrast Given 11/06/21 0312)  metoCLOPramide (REGLAN) injection 10 mg (10 mg Intravenous  Given 11/06/21 0347)    ED Course/ Medical Decision Making/ A&P                           Medical Decision Making 31 year old female presents to the ED today with complaint of nausea, vomiting, diarrhea, abdominal pain that began yesterday.  On arrival to the ED patient is afebrile, nontachycardic and nontachypneic.  She is actively vomiting in the room at this time.  She is noted to have diffuse abdominal tenderness palpation without rebound or guarding.  No previous abdominal surgeries.  Denies any recent sick contacts.  Question viral gastroenteritis versus gallbladder etiology versus GERD versus gastritis versus PUD versus pancreatitis versus other acute intra-abdominal abnormality.  No specific McBurney's point tenderness to suggest appendicitis.  She denies any specific pelvic pain.  She had mentioned to EMS that there is a chance that she could be pregnant however mentions to me that she started her menstrual cycle last night.  hCG pending at this time.  Question symptoms related  to possible pregnancy.  We will plan to provide fluids and antiemetics for symptomatic relief.  We will hold off on Bentyl until pregnancy test returns.  Will obtain labs including CBC, CMP, lipase, urinalysis and reevaluate.   Workup overall reassuring. Labs and CT scan without acute findings. Pt able to tolerate PO without difficulty. Stable for discharge home.   Problems Addressed: Gastroenteritis: acute illness or injury Nausea vomiting and diarrhea: acute illness or injury  Amount and/or Complexity of Data Reviewed Labs: ordered.    Details: CBC with leukocytosis 14,800.  CMP with glucose 113. Bicarb 20; no gap. Suspect likely related to dehydration. No other electrolyte abnormalities. LFts unremarkable.  Lipase WNL at 27 Radiology: ordered.    Details: CT A/P persued due to elevated WBC and diffuse abdominal pain. Negative for any acute findings.  Risk Prescription drug management.          Final Clinical Impression(s) / ED Diagnoses Final diagnoses:  Nausea vomiting and diarrhea  Gastroenteritis    Rx / DC Orders ED Discharge Orders          Ordered    ondansetron (ZOFRAN-ODT) 4 MG disintegrating tablet  Every 8 hours PRN        11/06/21 0505    dicyclomine (BENTYL) 20 MG tablet  2 times daily        11/06/21 0505             Discharge Instructions      Your workup was overall reassuring without any acute findings on CT scan or labwork.   Your symptoms are likely related to a viral illness. Please pick up medications from pharmacy and take as needed. Drink plenty of fluids to stay hydrated.   It is recommended that you follow a bland diet over the course of the next few days (see attached).  Ramp up your diet as tolerated.   Follow up with your PCP for further evaluation. If you do not have one you can follow up with [facility] for primary care needs.   Return to the ED for any new/worsening symptoms       Tanda Rockers,  PA-C 11/06/21 3267    Geoffery Lyons, MD 11/06/21 [phone]

## 2021-11-06 NOTE — ED Triage Notes (Signed)
Patient c/o n/v/d since 0800 yesterday AM.  Patient reports there is a chance she may be pregnant.  Patient denies sick contacts. ? ? ?

## 2021-11-06 NOTE — ED Notes (Signed)
Patient given water for PO challenge.  

## 2021-11-07 ENCOUNTER — Telehealth: Payer: Self-pay

## 2021-11-07 DIAGNOSIS — Z9189 Other specified personal risk factors, not elsewhere classified: Secondary | ICD-10-CM

## 2021-11-07 NOTE — Telephone Encounter (Signed)
Transition Care Management Follow-up Telephone Call ?Date of discharge and from where: 11/06/2021-[place]  ?How have you been since you were released from the hospital? Pt stated she is doing fine.  ?Any questions or concerns? No ? ?Items Reviewed: ?Did the pt receive and understand the discharge instructions provided? Yes  ?Medications obtained and verified? Yes  ?Other? No  ?Any new allergies since your discharge? No  ?Dietary orders reviewed? No ?Do you have support at home? Yes  ? ?Home Care and Equipment/Supplies: ?Were home health services ordered? not applicable ?If so, what is the name of the agency? N/A  ?Has the agency set up a time to come to the patient's home? not applicable ?Were any new equipment or medical supplies ordered?  No ?What is the name of the medical supply agency? N/A ?Were you able to get the supplies/equipment? not applicable ?Do you have any questions related to the use of the equipment or supplies? No ? ?Functional Questionnaire: (I = Independent and D = Dependent) ?ADLs: I ? ?Bathing/Dressing- I ? ?Meal Prep- I ? ?Eating- I ? ?Maintaining continence- I ? ?Transferring/Ambulation- I ? ?Managing Meds- I ? ?Follow up appointments reviewed: ? ?PCP Hospital f/u appt confirmed? No   ?Specialist Hospital f/u appt confirmed? No   ?Are transportation arrangements needed? No  ?If their condition worsens, is the pt aware to call PCP or go to the Emergency Dept.? Yes ?Was the patient provided with contact information for the PCP's office or ED? Yes ?Was to pt encouraged to call back with questions or concerns? Yes  ?

## 2021-11-07 NOTE — Telephone Encounter (Signed)
NA

## 2021-11-09 ENCOUNTER — Telehealth: Payer: Self-pay

## 2021-11-09 NOTE — Telephone Encounter (Signed)
? ?  Telephone encounter was:  Unsuccessful.  11/09/2021 ?Name: Sharon Barnett MRN: [id number] DOB: 1991-06-04 ? ?Unsuccessful outbound call made today to assist with:   PCP ? ?Outreach Attempt:  1st Attempt ? ?A HIPAA compliant voice message was left requesting a return call.  Instructed patient to call back at earliest convenience. ?. ? ? ? ?Larena Sox ?Care Guide, Embedded Care Coordination ?[place], Care Management  ?[phone] ?[address], [place], [place] [address] ?Phone: [phone] ?Email: [email or web address] ? ?  ?

## 2021-11-10 ENCOUNTER — Telehealth: Payer: Self-pay

## 2021-11-10 NOTE — Telephone Encounter (Signed)
? ?  Telephone encounter was:  Successful.  ?11/10/2021 ?Name: [name] MRN: [id number] DOB: 01-02-1991 ? ?Sharon Barnett is a 31 y.o. year old female who is a primary care patient of Patient, No Pcp Per (Inactive) . The community resource team was consulted for assistance with  pcp ? ?Care guide performed the following interventions: Patient provided with information about care guide support team and interviewed to confirm resource needs. Provided infomation to patient over the phone  on how to find a pcp by telephone and online as well as mailing some resources ? ?Follow Up Plan:  No further follow up planned at this time. The patient has been provided with needed resources. ? ? ? ?Lenard Forth ?Care Guide, Embedded Care Coordination ?[place], Care Management  ?[phone] ?[address] [place], [place], [place] [address] ?Phone: [phone] ?Email: [email or web address] ? ?  ?

## 2022-02-21 ENCOUNTER — Encounter (HOSPITAL_COMMUNITY): Payer: Self-pay

## 2022-02-21 ENCOUNTER — Ambulatory Visit (HOSPITAL_COMMUNITY)
Admission: EM | Admit: 2022-02-21 | Discharge: 2022-02-21 | Disposition: A | Discharge status: Home / Self Care | Payer: Medicaid Other | Attending: Internal Medicine | Admitting: Internal Medicine

## 2022-02-21 DIAGNOSIS — Z113 Encounter for screening for infections with a predominantly sexual mode of transmission: Secondary | ICD-10-CM | POA: Diagnosis present

## 2022-02-21 DIAGNOSIS — M25562 Pain in left knee: Secondary | ICD-10-CM | POA: Insufficient documentation

## 2022-02-21 DIAGNOSIS — Z7251 High risk heterosexual behavior: Secondary | ICD-10-CM | POA: Diagnosis not present

## 2022-02-21 DIAGNOSIS — R1031 Right lower quadrant pain: Secondary | ICD-10-CM | POA: Diagnosis present

## 2022-02-21 DIAGNOSIS — S8992XA Unspecified injury of left lower leg, initial encounter: Secondary | ICD-10-CM | POA: Diagnosis present

## 2022-02-21 DIAGNOSIS — R3915 Urgency of urination: Secondary | ICD-10-CM | POA: Insufficient documentation

## 2022-02-21 LAB — POCT URINALYSIS DIPSTICK, ED / UC
Bilirubin Urine: NEGATIVE
Glucose, UA: NEGATIVE mg/dL
Hgb urine dipstick: NEGATIVE
Ketones, ur: NEGATIVE mg/dL
Nitrite: NEGATIVE
Protein, ur: NEGATIVE mg/dL
Specific Gravity, Urine: 1.025 (ref 1.005–1.030)
Urobilinogen, UA: 0.2 mg/dL (ref 0.0–1.0)
pH: 7.5 (ref 5.0–8.0)

## 2022-02-21 LAB — POC URINE PREG, ED: Preg Test, Ur: NEGATIVE

## 2022-02-21 MED ORDER — ACETAMINOPHEN 500 MG PO TABS: 1000.0000 mg | ORAL_TABLET | Freq: Four times a day (QID) | ORAL | 0 refills | Status: DC | PRN

## 2022-02-21 MED ORDER — IBUPROFEN 600 MG PO TABS: 600.0000 mg | ORAL_TABLET | Freq: Four times a day (QID) | ORAL | 0 refills | Status: DC | PRN
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
## 2022-02-21 NOTE — Discharge Instructions (Addendum)
Pregnancy test negative.  Urine is negative for UTI.  STI testing is pending.  We will get the results back in the next 2 to 3 days call you if any of your results are positive.  We will treat you for any STIs at that time.  Avoid sexual intercourse until we receive your STI testing results. To your gynecology appointment as scheduled for follow-up on your right lower abdominal pain.   You may take ibuprofen and Tylenol every 6 hours as needed for pain.  These medicines with food to avoid GI upset.  If you develop any new or worsening symptoms or do not improve in the next 2 to 3 days, please return.  If your symptoms are severe, please go to the emergency room.  Follow-up with your primary care provider for further evaluation and management of your symptoms as well as ongoing wellness visits.  I hope you feel better!
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
## 2022-02-22 LAB — CERVICOVAGINAL ANCILLARY ONLY
Bacterial Vaginitis (gardnerella): POSITIVE — AB
Candida Glabrata: NEGATIVE
Candida Vaginitis: NEGATIVE
Chlamydia: NEGATIVE
Comment: NEGATIVE
Comment: NEGATIVE
Comment: NEGATIVE
Comment: NEGATIVE
Comment: NEGATIVE
Comment: NORMAL
Neisseria Gonorrhea: NEGATIVE
Trichomonas: NEGATIVE

## 2022-02-24 ENCOUNTER — Telehealth (HOSPITAL_COMMUNITY): Payer: Self-pay | Admitting: Emergency Medicine

## 2022-02-24 MED ORDER — METRONIDAZOLE 0.75 % VA GEL: 1.0000 | Freq: Every day | VAGINAL | 0 refills | Status: AC

## 2022-03-20 ENCOUNTER — Ambulatory Visit: Payer: Medicaid Other | Admitting: Student

## 2022-03-20 NOTE — Progress Notes (Deleted)
Subjective:  Patient ID: Sharon Barnett, female    DOB: [birth year], 31 y.o.   MRN: [id number]  CC: New Patient  HPI:  Sharon Barnett is a very pleasant 31 y.o. female who presents today to establish care.   PMHx: Past Medical History:  Diagnosis Date   ADHD (attention deficit hyperactivity disorder)    Boils    Complication of anesthesia    Hypersensitive to anesthesia (wisdom teeth)   Headache    Hyperemesis gravidarum    Vaginal Pap smear, abnormal     Surgical Hx: Past Surgical History:  Procedure Laterality Date   WISDOM TOOTH EXTRACTION      Family Hx: Family History  Problem Relation Age of Onset   Depression Mother    Diabetes Mother    Hypertension Mother    Miscarriages / [place] Mother    Asthma Sister    Depression Sister    Hearing loss Sister    Birth defects Sister    Hyperlipidemia Sister    Hypertension Sister    Miscarriages / Stillbirths Sister    Asthma Maternal Grandmother    Cancer Maternal Grandmother    Diabetes Maternal Grandmother    Hypertension Maternal Grandmother    Stroke Maternal Grandmother    Vision loss Maternal Grandmother    Cancer Paternal Grandmother     Social Hx: Current Social History   Who lives at home: *** 03/20/2022  Who would speak for you about health care matters: *** 03/20/2022  Transportation: *** 03/20/2022 Important Relationships & Pets: *** 03/20/2022  Current Stressors: *** 03/20/2022 Work / Education:  *** 03/20/2022 Religious / Personal Beliefs: *** 03/20/2022 Interests / Fun: *** 03/20/2022 Other: *** 03/20/2022   Medications:   ROS: Woman:  Patient reports no  vision/ hearing changes,anorexia, weight change, fever ,adenopathy, persistant / recurrent hoarseness, swallowing issues, chest pain, edema,persistant / recurrent cough, hemoptysis, dyspnea(rest, exertional, paroxysmal nocturnal), gastrointestinal  bleeding (melena, rectal bleeding), abdominal pain, excessive heart burn, GU  symptoms(dysuria, hematuria, pyuria, voiding/incontinence  Issues) syncope, focal weakness, severe memory loss, concerning skin lesions, depression, anxiety, abnormal bruising/bleeding, major joint swelling, breast masses or abnormal vaginal bleeding.    Man:  Patient reports no  vision/ hearing changes,anorexia, weight change, fever ,adenopathy, persistant / recurrent hoarseness, swallowing issues, chest pain, edema,persistant / recurrent cough, hemoptysis, dyspnea(rest, exertional, paroxysmal nocturnal), gastrointestinal  bleeding (melena, rectal bleeding), abdominal pain, excessive heart burn, GU symptoms(dysuria, hematuria, pyuria, voiding/incontinence  Issues) syncope, focal weakness, severe memory loss, concerning skin lesions, depression, anxiety, abnormal bruising/bleeding, major joint swelling.    Preventative Screening H/o abnormal pap, needs updated pap smear   Smoking status reviewed  ROS: pertinent noted in the HPI    Objective:  LMP 02/06/2022  Vitals and nursing note reviewed  General: NAD, pleasant, able to participate in exam HEENT: normocephalic, TM's visualized bilaterally, no scleral icterus or conjunctival pallor, no nasal discharge, moist mucous membranes, good dentition without erythema or discharge noted in posterior oropharynx Neck: supple, non-tender, without lymphadenopathy Cardiac: RRR, S1 S2 present. normal heart sounds, no murmurs. Respiratory: CTAB, normal effort, No wheezes, rales or rhonchi Abdomen: Normoactive bowel sounds, non-tender, non-distended, no hepatosplenomegaly Extremities: no edema or cyanosis. Skin: warm and dry, no rashes noted Neuro: alert, no obvious focal deficits Psych: Normal affect and mood  Assessment & Plan:  No problem-specific Assessment & Plan notes found for this encounter.   No orders of the defined types were placed in this encounter.  No orders of the defined types were  placed in this encounter.  No follow-ups on  file. Alfredo Martinez, MD PGY-2 Family Medicine

## 2022-04-12 IMAGING — CT CT ABD-PELV W/ CM
2 of 4 series · 15 of 46 positions shown, 17 images · IV contrast (agent unspecified)
Comparison: None.

CLINICAL DATA: Nausea, vomiting, and diarrhea since yesterday.

EXAM:
CT ABDOMEN AND PELVIS WITH CONTRAST
TECHNIQUE: Multidetector CT imaging of the abdomen and pelvis was performed
using the standard protocol following bolus administration of
intravenous contrast.

[Series 2: axial st · axial · 0.68mm/px · z∈[-420,-15]mm · 12 of 91 slices shown, 14 images]
[im 5/91  soft-tissue]
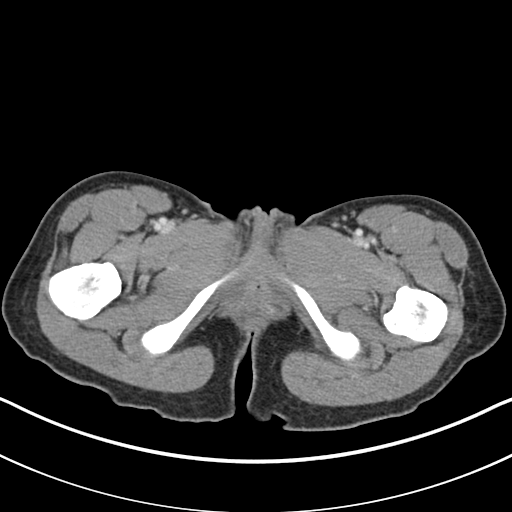
[im 5/91  bone]
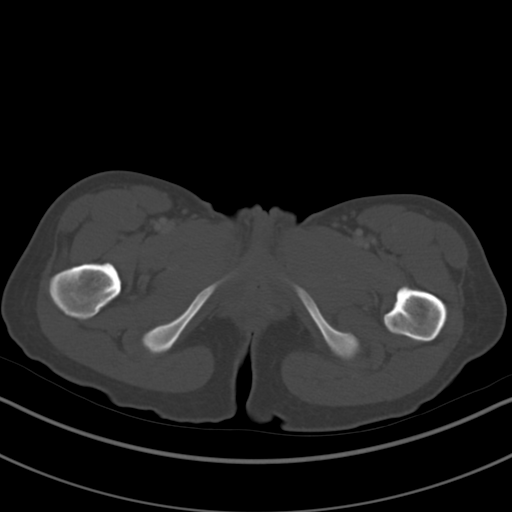
[im 15/91  soft-tissue]
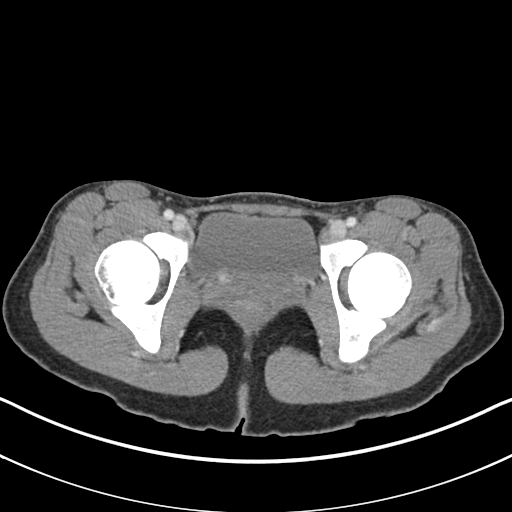
[im 19/91  soft-tissue]
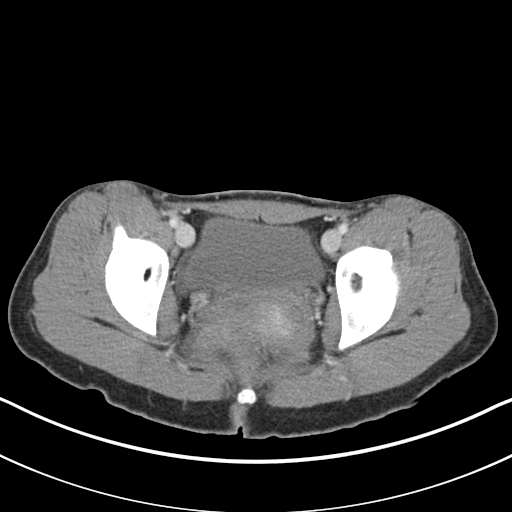
[im 29/91  soft-tissue]
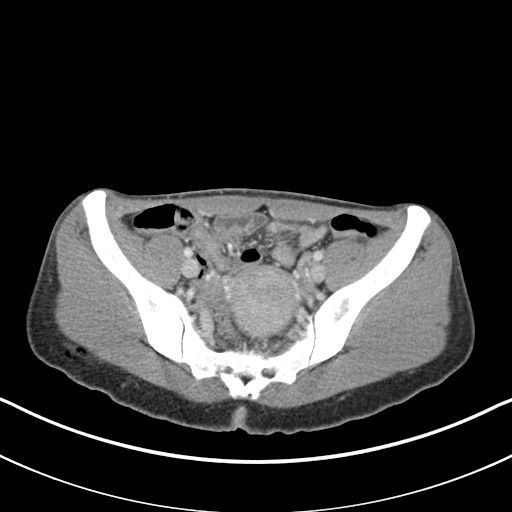
[im 34/91  soft-tissue]
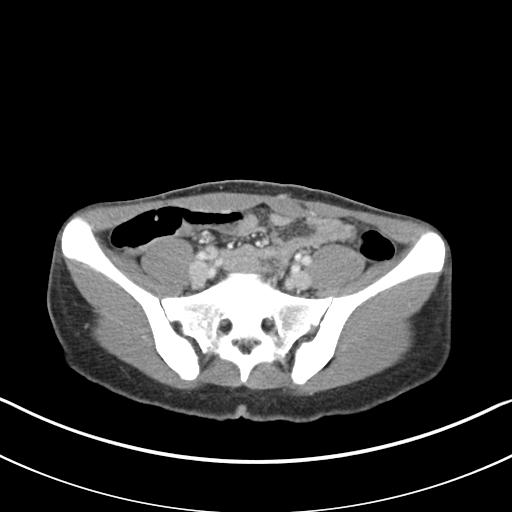
[im 43/91  soft-tissue]
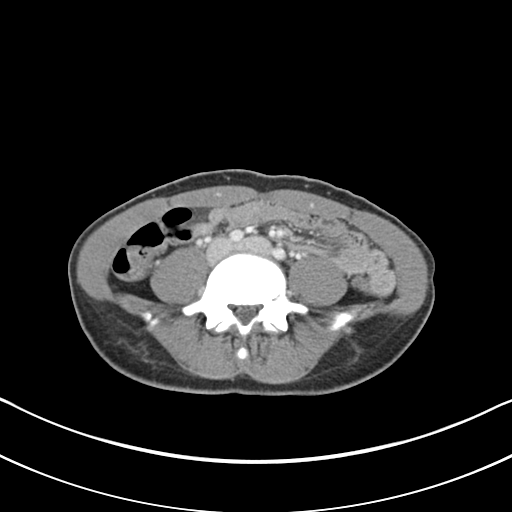
[im 48/91  soft-tissue]
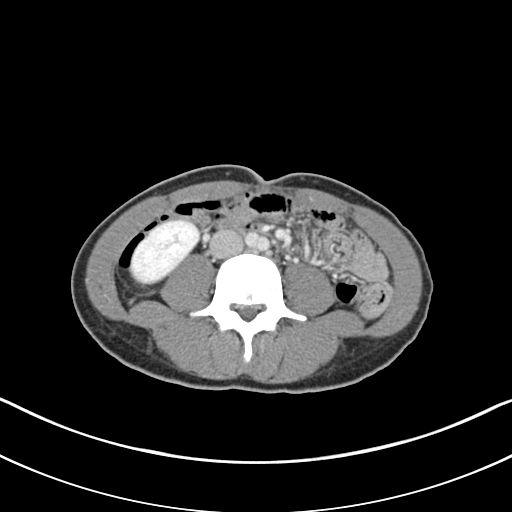
[im 57/91  soft-tissue]
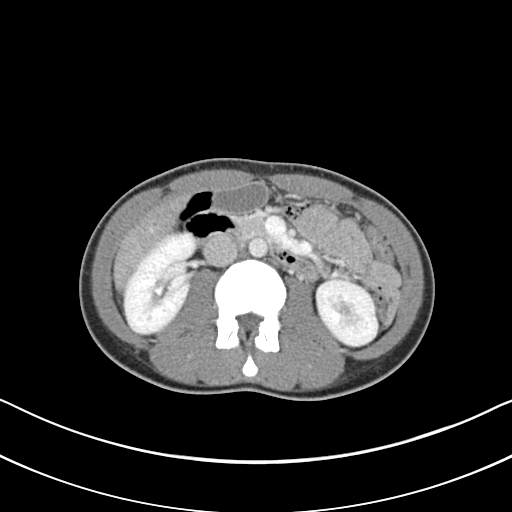
[im 62/91  soft-tissue]
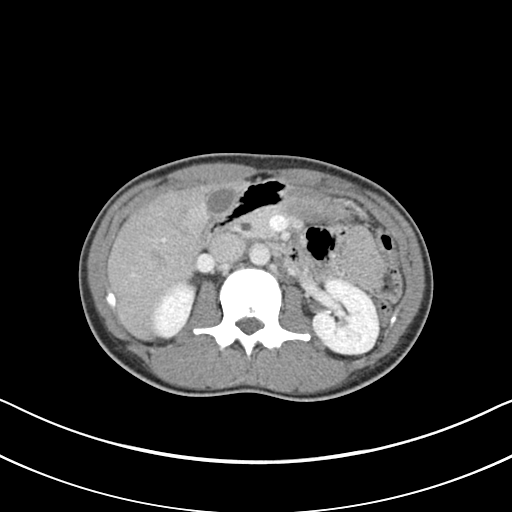
[im 62/91  bone]
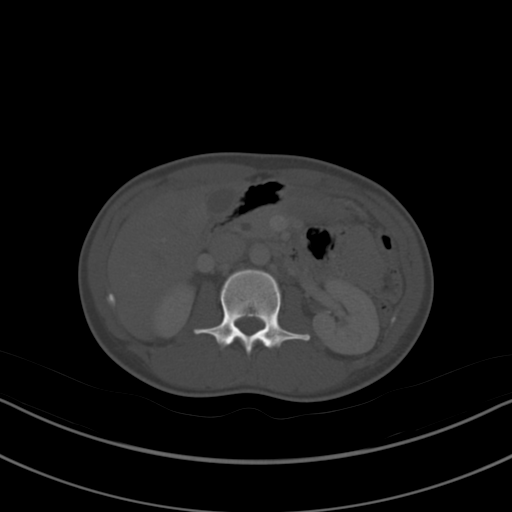
[im 72/91  soft-tissue]
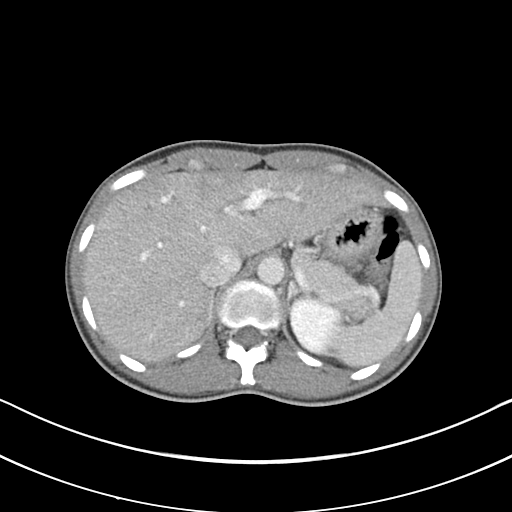
[im 76/91  soft-tissue]
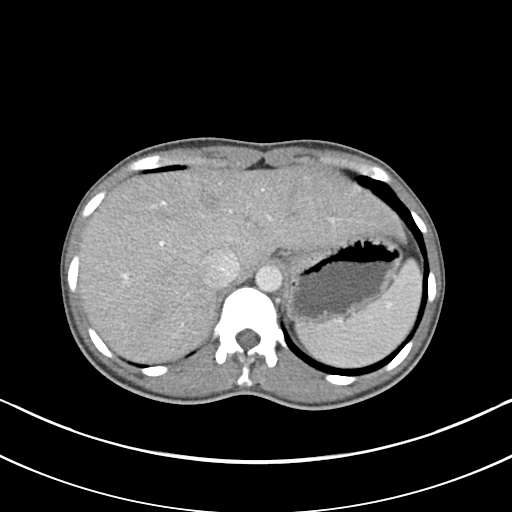
[im 86/91  soft-tissue]
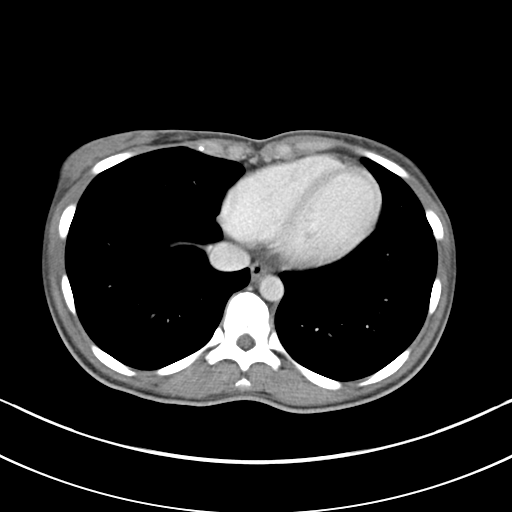

[Series 5: coronal st · coronal · 0.60mm/px · 3 of 105 slices shown]
[im 35/105  soft-tissue]
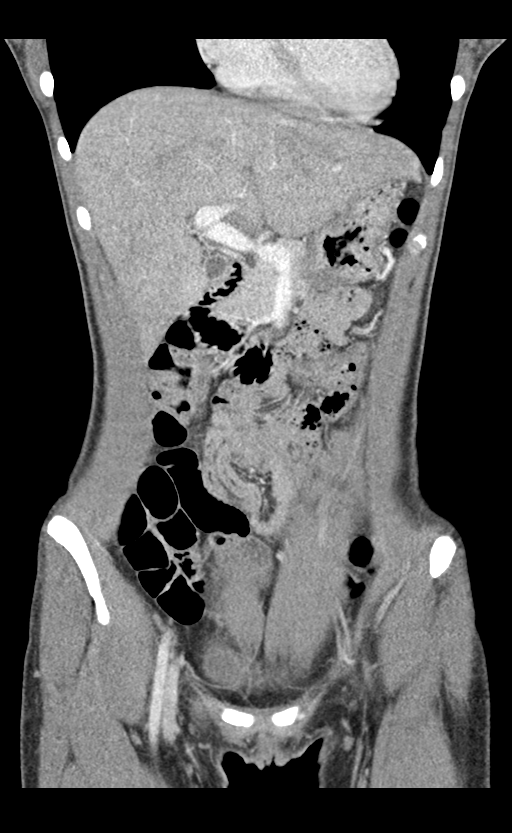
[im 47/105  soft-tissue]
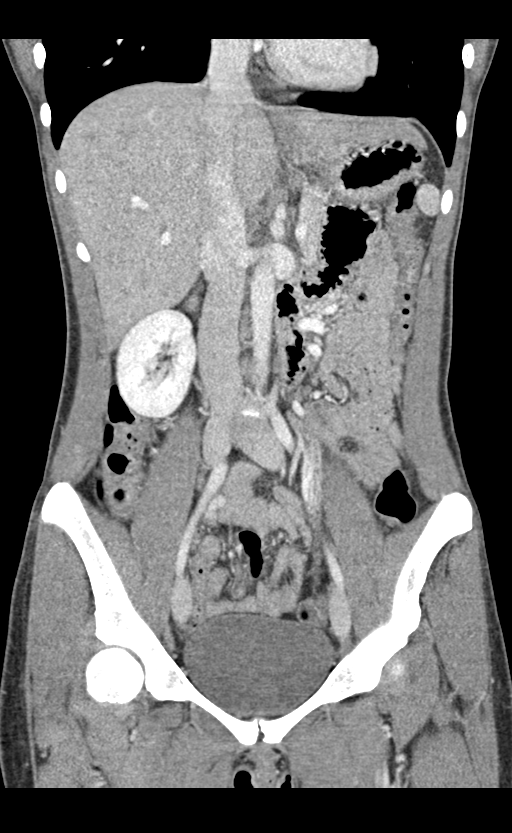
[im 58/105  soft-tissue]
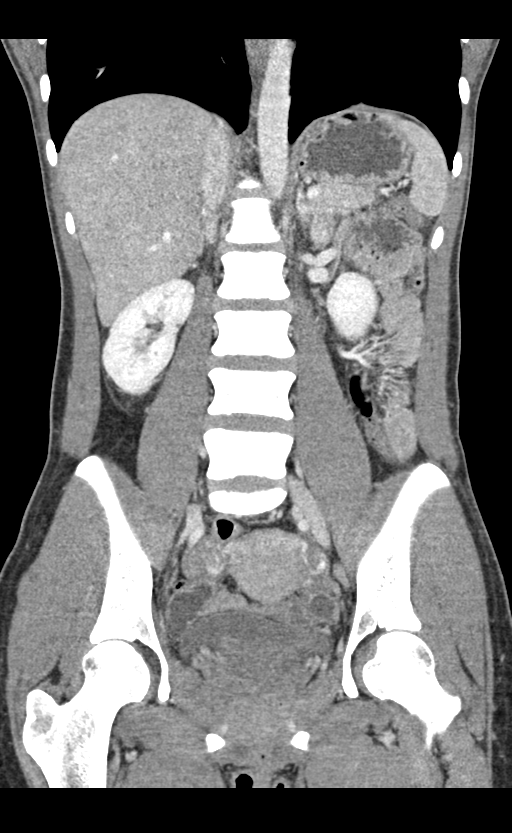

[15 of 46 positions shown; findings below may reference images not displayed]

RADIATION DOSE REDUCTION: This exam was performed according to the
departmental dose-optimization program which includes automated
exposure control, adjustment of the mA and/or kV according to
patient size and/or use of iterative reconstruction technique.

CONTRAST:  100mL OMNIPAQUE IOHEXOL 300 MG/ML  SOLN
FINDINGS: Lower chest: No acute abnormality.

Hepatobiliary: No focal liver abnormality is seen. No gallstones,
gallbladder wall thickening, or biliary dilatation.

Pancreas: Unremarkable. No pancreatic ductal dilatation or
surrounding inflammatory changes.

Spleen: Normal in size without focal abnormality.

Adrenals/Urinary Tract: The adrenal glands are within normal limits.
The kidneys enhance symmetrically. Evaluation for renal calculus is
limited due the presence of excreted contrast at the renal pyramids.
No hydronephrosis. The bladder is unremarkable.

Stomach/Bowel: Stomach is within normal limits. Appendix appears
normal. No evidence of bowel wall thickening, distention, or
inflammatory changes. No free air or pneumatosis.

Vascular/Lymphatic: No significant vascular findings are present. No
enlarged abdominal or pelvic lymph nodes.

Reproductive: A 1 cm hypodensity is present in the uterine fundus,
possible fibroid. There is a cystic structure with a hyperdense rim
in the left ovary measuring 1.5 cm, likely hemorrhagic or corpus
luteal cyst. No adnexal mass on the right.

Other: No abdominal wall hernia or abnormality. No abdominopelvic
ascites.

Musculoskeletal: No acute or significant osseous findings.
IMPRESSION: No bowel obstruction or free air.  Normal appendix.

## 2022-07-06 ENCOUNTER — Encounter (HOSPITAL_COMMUNITY): Payer: Self-pay

## 2022-07-06 ENCOUNTER — Other Ambulatory Visit: Payer: Self-pay

## 2022-07-06 ENCOUNTER — Emergency Department (HOSPITAL_COMMUNITY)
Admission: EM | Admit: 2022-07-06 | Discharge: 2022-07-06 | Disposition: A | Discharge status: Home / Self Care | Payer: Medicaid Other | Attending: Emergency Medicine | Admitting: Emergency Medicine

## 2022-07-06 DIAGNOSIS — Z3A01 Less than 8 weeks gestation of pregnancy: Secondary | ICD-10-CM | POA: Diagnosis not present

## 2022-07-06 DIAGNOSIS — O219 Vomiting of pregnancy, unspecified: Secondary | ICD-10-CM | POA: Diagnosis not present

## 2022-07-06 DIAGNOSIS — R112 Nausea with vomiting, unspecified: Secondary | ICD-10-CM

## 2022-07-06 DIAGNOSIS — Z3490 Encounter for supervision of normal pregnancy, unspecified, unspecified trimester: Secondary | ICD-10-CM

## 2022-07-06 DIAGNOSIS — R079 Chest pain, unspecified: Secondary | ICD-10-CM | POA: Diagnosis not present

## 2022-07-06 DIAGNOSIS — R110 Nausea: Secondary | ICD-10-CM | POA: Diagnosis not present

## 2022-07-06 DIAGNOSIS — E860 Dehydration: Secondary | ICD-10-CM | POA: Diagnosis not present

## 2022-07-06 DIAGNOSIS — Z3A00 Weeks of gestation of pregnancy not specified: Secondary | ICD-10-CM | POA: Insufficient documentation

## 2022-07-06 DIAGNOSIS — R0789 Other chest pain: Secondary | ICD-10-CM | POA: Diagnosis not present

## 2022-07-06 LAB — HCG, QUANTITATIVE, PREGNANCY: hCG, Beta Chain, Quant, S: 32537 m[IU]/mL — ABNORMAL HIGH (ref <5)

## 2022-07-06 LAB — CBC
HCT: 37.1 % (ref 36.0–46.0)
Hemoglobin: 12.6 g/dL (ref 12.0–15.0)
MCH: 30.2 pg (ref 26.0–34.0)
MCHC: 34 g/dL (ref 30.0–36.0)
MCV: 89 fL (ref 80.0–100.0)
Platelets: 309 10*3/uL (ref 150–400)
RBC: 4.17 MIL/uL (ref 3.87–5.11)
RDW: 12 % (ref 11.5–15.5)
WBC: 7 10*3/uL (ref 4.0–10.5)
nRBC: 0 % (ref 0.0–0.2)

## 2022-07-06 LAB — COMPREHENSIVE METABOLIC PANEL
ALT: 14 U/L (ref 0–44)
AST: 15 U/L (ref 15–41)
Albumin: 4 g/dL (ref 3.5–5.0)
Alkaline Phosphatase: 45 U/L (ref 38–126)
Anion gap: 11 (ref 5–15)
BUN: 13 mg/dL (ref 6–20)
CO2: 18 mmol/L — ABNORMAL LOW (ref 22–32)
Calcium: 9.2 mg/dL (ref 8.9–10.3)
Chloride: 107 mmol/L (ref 98–111)
Creatinine, Ser: 0.7 mg/dL (ref 0.44–1.00)
GFR, Estimated: >60 mL/min (ref >60)
Glucose, Bld: 77 mg/dL (ref 70–99)
Potassium: 3.1 mmol/L — ABNORMAL LOW (ref 3.5–5.1)
Sodium: 136 mmol/L (ref 135–145)
Total Bilirubin: 1.2 mg/dL (ref 0.3–1.2)
Total Protein: 7.8 g/dL (ref 6.5–8.1)

## 2022-07-06 LAB — I-STAT BETA HCG BLOOD, ED (MC, WL, AP ONLY): I-stat hCG, quantitative: >2000 m[IU]/mL — ABNORMAL HIGH (ref <5)

## 2022-07-06 LAB — LIPASE, BLOOD: Lipase: 27 U/L (ref 11–51)

## 2022-07-06 MED ORDER — ONDANSETRON HCL 4 MG/2ML IJ SOLN
4.0000 mg | Freq: Once | INTRAMUSCULAR | Status: AC
  Administered 2022-07-06: 4 mg via INTRAVENOUS
  Filled 2022-07-06: qty 2

## 2022-07-06 MED ORDER — ONDANSETRON HCL 4 MG PO TABS: 4.0000 mg | ORAL_TABLET | Freq: Three times a day (TID) | ORAL | 0 refills | Status: DC | PRN

## 2022-07-06 NOTE — ED Triage Notes (Signed)
Pt BIB EMS from home. Pt c/o N/V x3 days. Pt states the last time this happened she was pregnant, pt states she has had a positive pregnancy test in the last week.   124/70 68 HR 16 Resp 98% RA CBG 85

## 2022-07-06 NOTE — ED Provider Notes (Signed)
[facility] DEPT Provider Note   CSN: [id number] Arrival date & time: 07/06/22  1105     History  Chief Complaint  Patient presents with   Nausea   Emesis    Sharon Barnett is a 31 y.o. female.  Patient presents to the hospital complaining of 3 days of nausea and vomiting.  Patient states the last time she felt this way she found she was pregnant.  She endorses having a home test which was positive over the past week.  EMS reported stable vitals during transport.  Patient has no other complaints at this time.  She does have a history of hyperemesis gravidarum, attention-deficit disorder with hyperactivity, headaches  HPI     Home Medications Prior to Admission medications   Medication Sig Start Date End Date Taking? Authorizing Provider  ondansetron (ZOFRAN) 4 MG tablet Take 1 tablet (4 mg total) by mouth every 8 (eight) hours as needed for nausea or vomiting. 07/06/22  Yes Dorothyann Peng, PA-C  acetaminophen (TYLENOL) 500 MG tablet Take 2 tablets (1,000 mg total) by mouth every 6 (six) hours as needed. 02/21/22   Talbot Grumbling, FNP  ibuprofen (ADVIL) 600 MG tablet Take 1 tablet (600 mg total) by mouth every 6 (six) hours as needed. 02/21/22   Talbot Grumbling, FNP  zolpidem (AMBIEN CR) 6.25 MG CR tablet Take 1 tablet (6.25 mg total) by mouth at bedtime as needed for sleep. Patient not taking: Reported on 02/04/2019 07/11/16 05/20/19  Steve Rattler, DO      Allergies    Patient has no known allergies.    Review of Systems   Review of Systems  Gastrointestinal:  Positive for nausea and vomiting. Negative for abdominal pain.    Physical Exam Updated Vital Signs BP 109/62 (BP Location: Right Arm)   Pulse 69   Temp 98.1 F (36.7 C) (Oral)   Resp 15   SpO2 100%  Physical Exam Vitals and nursing note reviewed.  Constitutional:      General: She is not in acute distress.    Appearance: She is well-developed.  HENT:     Head:  Normocephalic and atraumatic.  Eyes:     Conjunctiva/sclera: Conjunctivae normal.  Cardiovascular:     Rate and Rhythm: Normal rate and regular rhythm.     Heart sounds: No murmur heard. Pulmonary:     Effort: Pulmonary effort is normal. No respiratory distress.     Breath sounds: Normal breath sounds.  Abdominal:     Palpations: Abdomen is soft.     Tenderness: There is no abdominal tenderness.  Musculoskeletal:        General: No swelling.     Cervical back: Neck supple.  Skin:    General: Skin is warm and dry.     Capillary Refill: Capillary refill takes less than 2 seconds.  Neurological:     Mental Status: She is alert.  Psychiatric:        Mood and Affect: Mood normal.     ED Results / Procedures / Treatments   Labs (all labs ordered are listed, but only abnormal results are displayed) Labs Reviewed  COMPREHENSIVE METABOLIC PANEL - Abnormal; Notable for the following components:      Result Value   Potassium 3.1 (*)    CO2 18 (*)    All other components within normal limits  HCG, QUANTITATIVE, PREGNANCY - Abnormal; Notable for the following components:   hCG, Beta Chain, Quant, S 32,537 (*)  All other components within normal limits  I-STAT BETA HCG BLOOD, ED ([facility], [facility], [facility] ONLY) - Abnormal; Notable for the following components:   I-stat hCG, quantitative >2,000.0 (*)    All other components within normal limits  LIPASE, BLOOD  CBC  URINALYSIS, ROUTINE W REFLEX MICROSCOPIC    EKG None  Radiology No results found.  Procedures Procedures    Medications Ordered in ED Medications  ondansetron (ZOFRAN) injection 4 mg (4 mg Intravenous Given 07/06/22 1417)    ED Course/ Medical Decision Making/ A&P                           Medical Decision Making Amount and/or Complexity of Data Reviewed Labs: ordered.  Risk Prescription drug management.   Patient presents to the emergency department chief complaint of nausea and vomiting.  Differential diagnosis  includes gastroenteritis, pregnancy, appendicitis, and others  I viewed the patient's past medical history and found emergency department visits for abdominal pain, nausea, and vomiting  I ordered and reviewed labs.  Pertinent results include i-STAT beta-hCG greater than 2000, lipase 27, unremarkable CBC, potassium 3.1.  hCG quantitative was 32,537  There is no indication at this time for imaging or EKG  The patient does appear to be pregnant based on her i-STAT beta results.  I discussed prenatal care with the patient and she states that she has no intention of keeping the child.  Plan to discharge the patient Phenergan suppositories for symptom management.  She states she is unable to tolerate oral intake   I ordered the patient IV Zofran for nausea and vomiting.  The patient was able to pass an oral challenge.  She improved upon reassessment  There is no indication at this time for admission. Plan to discharge patient with Zofran prescription. Patient planning to follow up outpatient for possible abortion. I recommend routine prenatal care if there is any chance of keeping the child       Final Clinical Impression(s) / ED Diagnoses Final diagnoses:  Nausea and vomiting, unspecified vomiting type  Pregnancy, unspecified gestational age    Rx / DC Orders ED Discharge Orders          Ordered    ondansetron (ZOFRAN) 4 MG tablet  Every 8 hours PRN        07/06/22 1541              Ronny Bacon 07/06/22 1541    Sherwood Gambler, MD 07/07/22 1700

## 2022-07-06 NOTE — Discharge Instructions (Addendum)
You were seen today due to nausea and vomiting.  Your lab work-up does reveal what appears to be pregnancy, possibly 8 weeks old. You discussed termination of pregnancy. Follow up as an outpatient for those services. Please follow up for routine prenatal care if there is any chance that you may keep the child. I have prescribed Zofran for nausea. Please take as directed.

## 2022-07-10 ENCOUNTER — Inpatient Hospital Stay (HOSPITAL_COMMUNITY)
Admission: AD | Admit: 2022-07-10 | Discharge: 2022-07-10 | Disposition: A | Discharge status: Home / Self Care | Payer: Medicaid Other | Attending: Obstetrics & Gynecology | Admitting: Obstetrics & Gynecology

## 2022-07-10 ENCOUNTER — Encounter (HOSPITAL_COMMUNITY): Payer: Self-pay | Admitting: Emergency Medicine

## 2022-07-10 ENCOUNTER — Other Ambulatory Visit: Payer: Self-pay

## 2022-07-10 ENCOUNTER — Inpatient Hospital Stay (HOSPITAL_COMMUNITY): Payer: Medicaid Other

## 2022-07-10 DIAGNOSIS — Z3A01 Less than 8 weeks gestation of pregnancy: Secondary | ICD-10-CM | POA: Diagnosis not present

## 2022-07-10 DIAGNOSIS — E876 Hypokalemia: Secondary | ICD-10-CM | POA: Diagnosis not present

## 2022-07-10 DIAGNOSIS — O99281 Endocrine, nutritional and metabolic diseases complicating pregnancy, first trimester: Secondary | ICD-10-CM | POA: Diagnosis not present

## 2022-07-10 DIAGNOSIS — O211 Hyperemesis gravidarum with metabolic disturbance: Secondary | ICD-10-CM | POA: Insufficient documentation

## 2022-07-10 DIAGNOSIS — R110 Nausea: Secondary | ICD-10-CM | POA: Diagnosis not present

## 2022-07-10 DIAGNOSIS — O208 Other hemorrhage in early pregnancy: Secondary | ICD-10-CM | POA: Diagnosis not present

## 2022-07-10 DIAGNOSIS — R1031 Right lower quadrant pain: Secondary | ICD-10-CM | POA: Insufficient documentation

## 2022-07-10 DIAGNOSIS — R531 Weakness: Secondary | ICD-10-CM | POA: Diagnosis not present

## 2022-07-10 DIAGNOSIS — O26891 Other specified pregnancy related conditions, first trimester: Secondary | ICD-10-CM | POA: Diagnosis not present

## 2022-07-10 DIAGNOSIS — R109 Unspecified abdominal pain: Secondary | ICD-10-CM | POA: Diagnosis not present

## 2022-07-10 DIAGNOSIS — R1111 Vomiting without nausea: Secondary | ICD-10-CM | POA: Diagnosis not present

## 2022-07-10 DIAGNOSIS — O219 Vomiting of pregnancy, unspecified: Secondary | ICD-10-CM | POA: Diagnosis not present

## 2022-07-10 DIAGNOSIS — Z3491 Encounter for supervision of normal pregnancy, unspecified, first trimester: Secondary | ICD-10-CM

## 2022-07-10 DIAGNOSIS — O210 Mild hyperemesis gravidarum: Secondary | ICD-10-CM

## 2022-07-10 DIAGNOSIS — R1084 Generalized abdominal pain: Secondary | ICD-10-CM | POA: Diagnosis not present

## 2022-07-10 LAB — COMPREHENSIVE METABOLIC PANEL
ALT: 15 U/L (ref 0–44)
AST: 16 U/L (ref 15–41)
Albumin: 4.2 g/dL (ref 3.5–5.0)
Alkaline Phosphatase: 45 U/L (ref 38–126)
Anion gap: 14 (ref 5–15)
BUN: 13 mg/dL (ref 6–20)
CO2: 21 mmol/L — ABNORMAL LOW (ref 22–32)
Calcium: 9.4 mg/dL (ref 8.9–10.3)
Chloride: 102 mmol/L (ref 98–111)
Creatinine, Ser: 0.74 mg/dL (ref 0.44–1.00)
GFR, Estimated: >60 mL/min (ref >60)
Glucose, Bld: 113 mg/dL — ABNORMAL HIGH (ref 70–99)
Potassium: 3.1 mmol/L — ABNORMAL LOW (ref 3.5–5.1)
Sodium: 137 mmol/L (ref 135–145)
Total Bilirubin: 1.3 mg/dL — ABNORMAL HIGH (ref 0.3–1.2)
Total Protein: 8.1 g/dL (ref 6.5–8.1)

## 2022-07-10 LAB — WET PREP, GENITAL
Sperm: NONE SEEN
Trich, Wet Prep: NONE SEEN
WBC, Wet Prep HPF POC: <10 (ref <10)
Yeast Wet Prep HPF POC: NONE SEEN

## 2022-07-10 LAB — CBC
HCT: 37.1 % (ref 36.0–46.0)
Hemoglobin: 13 g/dL (ref 12.0–15.0)
MCH: 30.2 pg (ref 26.0–34.0)
MCHC: 35 g/dL (ref 30.0–36.0)
MCV: 86.3 fL (ref 80.0–100.0)
Platelets: 326 10*3/uL (ref 150–400)
RBC: 4.3 MIL/uL (ref 3.87–5.11)
RDW: 12 % (ref 11.5–15.5)
WBC: 8.5 10*3/uL (ref 4.0–10.5)
nRBC: 0 % (ref 0.0–0.2)

## 2022-07-10 LAB — HCG, QUANTITATIVE, PREGNANCY: hCG, Beta Chain, Quant, S: 62402 m[IU]/mL — ABNORMAL HIGH (ref <5)

## 2022-07-10 LAB — GC/CHLAMYDIA PROBE AMP (~~LOC~~) NOT AT ARMC
Chlamydia: NEGATIVE
Comment: NEGATIVE
Comment: NORMAL
Neisseria Gonorrhea: NEGATIVE

## 2022-07-10 MED ORDER — SODIUM CHLORIDE 0.9 % IV SOLN
25.0000 mg | Freq: Once | INTRAVENOUS | Status: AC
  Administered 2022-07-10: 25 mg via INTRAVENOUS
  Filled 2022-07-10: qty 1

## 2022-07-10 MED ORDER — ONDANSETRON 4 MG PO TBDP
4.0000 mg | ORAL_TABLET | Freq: Once | ORAL | Status: DC
Start: 2022-07-10 — End: 2022-07-10

## 2022-07-10 MED ORDER — POTASSIUM CHLORIDE CRYS ER 20 MEQ PO TBCR: 40.0000 meq | EXTENDED_RELEASE_TABLET | Freq: Two times a day (BID) | ORAL | 0 refills | Status: DC

## 2022-07-10 MED ORDER — LACTATED RINGERS IV BOLUS
1000.0000 mL | Freq: Once | INTRAVENOUS | Status: AC
  Administered 2022-07-10: 1000 mL via INTRAVENOUS

## 2022-07-10 MED ORDER — SCOPOLAMINE 1 MG/3DAYS TD PT72: 1.0000 | MEDICATED_PATCH | TRANSDERMAL | 0 refills | Status: DC

## 2022-07-10 MED ORDER — ONDANSETRON 4 MG PO TBDP: 4.0000 mg | ORAL_TABLET | Freq: Once | ORAL | Status: AC

## 2022-07-10 MED ORDER — PROMETHAZINE HCL 25 MG PO TABS: 12.5000 mg | ORAL_TABLET | Freq: Four times a day (QID) | ORAL | 0 refills | Status: DC | PRN

## 2022-07-10 MED ORDER — ONDANSETRON 4 MG PO TBDP
ORAL_TABLET | ORAL | Status: AC
  Administered 2022-07-10: 4 mg via ORAL
  Filled 2022-07-10: qty 1

## 2022-07-10 MED ORDER — FAMOTIDINE IN NACL 20-0.9 MG/50ML-% IV SOLN
20.0000 mg | Freq: Once | INTRAVENOUS | Status: AC
  Administered 2022-07-10: 20 mg via INTRAVENOUS
  Filled 2022-07-10: qty 50

## 2022-07-10 MED ORDER — SCOPOLAMINE 1 MG/3DAYS TD PT72
1.0000 | MEDICATED_PATCH | TRANSDERMAL | Status: DC
  Administered 2022-07-10: 1.5 mg via TRANSDERMAL
  Filled 2022-07-10: qty 1

## 2022-07-10 NOTE — MAU Provider Note (Signed)
History     CSN: [id number]  Arrival date and time: 07/10/22 0231   Event Date/Time   First Provider Initiated Contact with Patient 07/10/22 0419      Chief Complaint  Patient presents with   Emesis   31 y.o. G2P1001 @[redacted]w[redacted]d  by LMP presenting with N/V and RLQ pain. Reports onset 5 days ago. States she cannot keep anything down. States she has lost weight. Denies diarrhea or fevers. She took Zofran at home but it didn't help. Admits to MJ use, last used 3 days ago. Reports sharp LAP, mostly on right side. Rates pain 7/10. Denies urinary sx.   OB History     Gravida  2   Para  0   Term  0   Preterm  0   AB  0   Living  0      SAB  0   IAB  0   Ectopic  0   Multiple  0   Live Births              Past Medical History:  Diagnosis Date   ADHD (attention deficit hyperactivity disorder)    Boils    Complication of anesthesia    Hypersensitive to anesthesia (wisdom teeth)   Headache    Hyperemesis gravidarum    Vaginal Pap smear, abnormal     Past Surgical History:  Procedure Laterality Date   WISDOM TOOTH EXTRACTION      Family History  Problem Relation Age of Onset   Depression Mother    Diabetes Mother    Hypertension Mother    Miscarriages / 9/10 Mother    Asthma Sister    Depression Sister    Hearing loss Sister    Birth defects Sister    Hyperlipidemia Sister    Hypertension Sister    Miscarriages / Stillbirths Sister    Asthma Maternal Grandmother    Cancer Maternal Grandmother    Diabetes Maternal Grandmother    Hypertension Maternal Grandmother    Stroke Maternal Grandmother    Vision loss Maternal Grandmother    Cancer Paternal Grandmother     Social History   Tobacco Use   Smoking status: Never   Smokeless tobacco: Never  Vaping Use   Vaping Use: Never used  Substance Use Topics   Alcohol use: No   Drug use: No    Allergies: No Known Allergies  No medications prior to admission.    Review of Systems   Constitutional:  Negative for fever.  Gastrointestinal:  Positive for abdominal pain, nausea and vomiting. Negative for diarrhea.  Genitourinary:  Negative for dysuria, vaginal bleeding and vaginal discharge.   Physical Exam   Blood pressure (!) 115/56, pulse 63, temperature 98.2 F (36.8 C), temperature source Oral, resp. rate 17, height 5\' 7"  (1.702 m), weight 53.3 kg, last menstrual period 05/24/2022, SpO2 96 %.  Physical Exam Vitals and nursing note reviewed.  Constitutional:      General: She is not in acute distress.    Appearance: Normal appearance.  HENT:     Head: Normocephalic and atraumatic.  Cardiovascular:     Rate and Rhythm: Normal rate.  Pulmonary:     Effort: Pulmonary effort is normal. No respiratory distress.  Abdominal:     General: There is no distension.     Palpations: Abdomen is soft. There is no mass.     Tenderness: There is abdominal tenderness in the right lower quadrant. There is no guarding or rebound.  Hernia: No hernia is present.  Musculoskeletal:        General: Normal range of motion.     Cervical back: Normal range of motion.  Skin:    General: Skin is warm and dry.  Neurological:     General: No focal deficit present.     Mental Status: She is alert and oriented to person, place, and time.  Psychiatric:        Mood and Affect: Mood normal.        Behavior: Behavior normal.    Results for orders placed or performed during the hospital encounter of 07/10/22 (from the past 24 hour(s))  Comprehensive metabolic panel     Status: Abnormal   Collection Time: 07/10/22  3:40 AM  Result Value Ref Range   Sodium 137 135 - 145 mmol/L   Potassium 3.1 (L) 3.5 - 5.1 mmol/L   Chloride 102 98 - 111 mmol/L   CO2 21 (L) 22 - 32 mmol/L   Glucose, Bld 113 (H) 70 - 99 mg/dL   BUN 13 6 - 20 mg/dL   Creatinine, Ser 1.61 0.44 - 1.00 mg/dL   Calcium 9.4 8.9 - 09.6 mg/dL   Total Protein 8.1 6.5 - 8.1 g/dL   Albumin 4.2 3.5 - 5.0 g/dL   AST 16 15 - 41  U/L   ALT 15 0 - 44 U/L   Alkaline Phosphatase 45 38 - 126 U/L   Total Bilirubin 1.3 (H) 0.3 - 1.2 mg/dL   GFR, Estimated >04 >54 mL/min   Anion gap 14 5 - 15  CBC     Status: None   Collection Time: 07/10/22  3:40 AM  Result Value Ref Range   WBC 8.5 4.0 - 10.5 K/uL   RBC 4.30 3.87 - 5.11 MIL/uL   Hemoglobin 13.0 12.0 - 15.0 g/dL   HCT 09.8 11.9 - 14.7 %   MCV 86.3 80.0 - 100.0 fL   MCH 30.2 26.0 - 34.0 pg   MCHC 35.0 30.0 - 36.0 g/dL   RDW 82.9 56.2 - 13.0 %   Platelets 326 150 - 400 K/uL   nRBC 0.0 0.0 - 0.2 %  hCG, quantitative, pregnancy     Status: Abnormal   Collection Time: 07/10/22  3:40 AM  Result Value Ref Range   hCG, Beta Chain, Quant, S 62,402 (H) <5 mIU/mL  Wet prep, genital     Status: Abnormal   Collection Time: 07/10/22  4:12 AM  Result Value Ref Range   Yeast Wet Prep HPF POC NONE SEEN NONE SEEN   Trich, Wet Prep NONE SEEN NONE SEEN   Clue Cells Wet Prep HPF POC PRESENT (A) NONE SEEN   WBC, Wet Prep HPF POC <10 <10   Sperm NONE SEEN    US OB Comp Less 14 Wks  Result Date: 07/10/2022 CLINICAL DATA:  31 year old pregnant female with history of abdominal pain, nausea and vomiting. EXAM: OBSTETRIC <14 WK ULTRASOUND TECHNIQUE: Transabdominal ultrasound was performed for evaluation of the gestation as well as the maternal uterus and adnexal regions. COMPARISON:  OB ultrasound 03/14/2016. FINDINGS: Intrauterine gestational sac: Single Yolk sac:  Present Embryo:  Present Cardiac Activity: Present Heart Rate: 120 bpm CRL:   5.0 mm   6 w 1 d                  [place] EDC: 03/04/2023 Subchorionic hemorrhage: Adjacent to the gestational sac there is a small 7 x 8 x 12 mm hypoechoic region,  compatible with a small subchorionic hemorrhage. Maternal uterus/adnexae: Left ovary is unremarkable in appearance. Probable degenerating corpus luteum cyst in the right ovary incidentally noted. In the posterior aspect of the uterine body there is a 4.6 x 4.1 x 4.0 cm lesion that is  heterogeneously echogenic, most likely to represent a fibroid. No significant free fluid in the cul-de-sac. IMPRESSION: 1. Single viable IUP with estimated gestational age of [redacted] weeks and 1 day and fetal heart rate of 120 beats per minute. 2. Small subchorionic hemorrhage. 3. 4.6 x 4.1 x 4.0 cm lesion in the posterior aspect of the uterine body, with imaging characteristics most compatible with a fibroid. 4. Probable degenerating corpus luteum cyst in the right ovary incidentally noted. Electronically Signed   By: Vinnie Langton M.D.   On: 07/10/2022 05:37    MAU Course  Procedures LR Phenergan Pepcid Scop  MDM Labs and [place] ordered and reviewed. Feels better after fluids and meds. No further vomiting. Tolerating po. Viable IUP on [place]. Stable for discharge home.   Assessment and Plan   1. [redacted] weeks gestation of pregnancy   2. Morning sickness   3. Hypokalemia   4. Normal intrauterine pregnancy on prenatal ultrasound in first trimester    Discharge home Follow up with OB provider of choice to start care Rx Promethazine Rx KDur Rx Scop  Allergies as of 07/10/2022   No Known Allergies      Medication List     STOP taking these medications    ibuprofen 600 MG tablet Commonly known as: ADVIL   ondansetron 4 MG tablet Commonly known as: Zofran       TAKE these medications    acetaminophen 500 MG tablet Commonly known as: TYLENOL Take 2 tablets (1,000 mg total) by mouth every 6 (six) hours as needed.   potassium chloride SA 20 MEQ tablet Commonly known as: KLOR-CON M Take 2 tablets (40 mEq total) by mouth 2 (two) times daily.   promethazine 25 MG tablet Commonly known as: PHENERGAN Take 0.5-1 tablets (12.5-25 mg total) by mouth every 6 (six) hours as needed for nausea or vomiting.   scopolamine 1 MG/3DAYS Commonly known as: TRANSDERM-SCOP Place 1 patch (1.5 mg total) onto the skin every 3 (three) days. Start taking on: July 13, 2022       Sharon Barnett,  [place] 07/10/2022, 7:55 AM

## 2022-07-10 NOTE — ED Triage Notes (Signed)
Per EMS, pt from home, c/o N/V abdominal pain/anterior chest wall pain.   Pt is [redacted] weeks pregnant .  Pt has taken zofran but has thrown it up.  This is her 2nd pregnancy.  Sister says she has not eaten since Thursday.    108/68 Pulse 72 CBG 106 RR 20 98% RA

## 2022-07-10 NOTE — Discharge Instructions (Signed)
[facility]   [facility]             [address], [place], [place] [address] [phone]  [facility]                                                             [address], [address], [place], [place], [address] [phone]  [facility]                                    [address], [place], [place], [address] [phone]  [facility] [place] [address], [place], [place], [address] [phone]  [facility]                                 [address], [place], [place], [address] [phone]  [facility]                                    [address], [place], [place], [address] [phone]  [facility] [address], [place], [place], [address]                              [facility] [address], [place], [place], [address] [phone]  [facility]         Phone: [phone]  [facility]      Phone: [phone]   [facility]      Phone: [phone]  [facility]         Phone: [phone]   [facility]    Phone: [phone]  [facility]      Phone: [phone]  [facility]      Phone: [phone]  [facility]        Phone: [phone]  [facility] (Sheronette Cousins) [phone]  [facility]      Phone: [phone]   

## 2022-07-10 NOTE — ED Provider Triage Note (Signed)
Emergency Medicine Provider Triage Evaluation Note  Sharon Barnett , a 31 y.o. female  was evaluated in triage.  Pt complains of right lower belly pain and intractable nausea and vomiting in context of pregnancy at 5 gestation.  Review of Systems  Positive: As above with NBNB emesis Negative: Vaginal bleeding discharge  Physical Exam  BP (!) 125/91   Pulse 76   Temp 98.4 F (36.9 C) (Oral)   Resp 18   Ht 5\' 7"  (1.702 m)   Wt 60.8 kg   SpO2 97%   BMI 20.99 kg/m  Gen:   Awake, ill-appearing, vomiting throughout Resp:  Normal effort  MSK:   Moves extremities without difficulty  Other:  Generalized abdominal tenderness worse in the right lower quadrant  Medical Decision Making  Medically screening exam initiated at 2:49 AM.  Appropriate orders placed.  Brilyn C Casamento was informed that the remainder of the evaluation will be completed by another provider, this initial triage assessment does not replace that evaluation, and the importance of remaining in the ED until their evaluation is complete.  Case discussed with MAU APP Threasa Beards who is agreeable to this at this patient department.  Report given to MAU RN and transport by this provider.  This chart was dictated using voice recognition software, Dragon. Despite the best efforts of this provider to proofread and correct errors, errors may still occur which can change documentation meaning.    Emeline Darling, PA-C 07/10/22 [phone]

## 2022-07-10 NOTE — MAU Note (Signed)
.  Sharon Barnett is a 31 y.o. at Unknown here in MAU reporting: transferred from main ED. Pt reports N/V since Wednesday. States she went to hospital on Thursday and they gave her a prescription for zofran but it has not helped. Reports pain in chest while vomiting, also reports right lower abdominal pain that is sharp that is constant. Denies VB or abnormal discharge.   LMP: May 24, 2022   Pain score: 7 - abdomen; 8 - chest  Vitals:   07/10/22 0234 07/10/22 0348  BP: (!) 125/91 115/77  Pulse: 76 72  Resp: 18 17  Temp: 98.4 F (36.9 C) 98.2 F (36.8 C)  SpO2: 97% 98%

## 2022-07-21 ENCOUNTER — Other Ambulatory Visit: Payer: Self-pay | Admitting: Certified Nurse Midwife

## 2022-07-22 ENCOUNTER — Inpatient Hospital Stay (HOSPITAL_COMMUNITY)
Admission: AD | Admit: 2022-07-22 | Discharge: 2022-07-23 | Disposition: A | Discharge status: Home / Self Care | Payer: Medicaid Other | Attending: Family Medicine | Admitting: Family Medicine

## 2022-07-22 ENCOUNTER — Other Ambulatory Visit: Payer: Self-pay

## 2022-07-22 ENCOUNTER — Encounter (HOSPITAL_COMMUNITY): Payer: Self-pay | Admitting: Family Medicine

## 2022-07-22 DIAGNOSIS — R112 Nausea with vomiting, unspecified: Secondary | ICD-10-CM | POA: Insufficient documentation

## 2022-07-22 DIAGNOSIS — O219 Vomiting of pregnancy, unspecified: Secondary | ICD-10-CM | POA: Diagnosis not present

## 2022-07-22 DIAGNOSIS — O26891 Other specified pregnancy related conditions, first trimester: Secondary | ICD-10-CM | POA: Diagnosis not present

## 2022-07-22 DIAGNOSIS — R509 Fever, unspecified: Secondary | ICD-10-CM | POA: Diagnosis not present

## 2022-07-22 DIAGNOSIS — Z3A08 8 weeks gestation of pregnancy: Secondary | ICD-10-CM

## 2022-07-22 DIAGNOSIS — T68XXXA Hypothermia, initial encounter: Secondary | ICD-10-CM | POA: Diagnosis not present

## 2022-07-22 DIAGNOSIS — I959 Hypotension, unspecified: Secondary | ICD-10-CM | POA: Diagnosis not present

## 2022-07-22 DIAGNOSIS — Z3A09 9 weeks gestation of pregnancy: Secondary | ICD-10-CM | POA: Insufficient documentation

## 2022-07-22 DIAGNOSIS — R1013 Epigastric pain: Secondary | ICD-10-CM | POA: Diagnosis not present

## 2022-07-22 DIAGNOSIS — R1111 Vomiting without nausea: Secondary | ICD-10-CM | POA: Diagnosis not present

## 2022-07-22 MED ORDER — SCOPOLAMINE 1 MG/3DAYS TD PT72
1.0000 | MEDICATED_PATCH | TRANSDERMAL | Status: DC
  Administered 2022-07-22: 1.5 mg via TRANSDERMAL
  Filled 2022-07-22: qty 1

## 2022-07-22 MED ORDER — LACTATED RINGERS IV BOLUS
1000.0000 mL | Freq: Once | INTRAVENOUS | Status: AC
  Administered 2022-07-22: 1000 mL via INTRAVENOUS

## 2022-07-22 MED ORDER — METOCLOPRAMIDE HCL 5 MG/ML IJ SOLN
10.0000 mg | Freq: Once | INTRAMUSCULAR | Status: AC
  Administered 2022-07-22: 10 mg via INTRAVENOUS
  Filled 2022-07-22: qty 2

## 2022-07-22 MED ORDER — ONDANSETRON HCL 4 MG/2ML IJ SOLN
4.0000 mg | Freq: Once | INTRAMUSCULAR | Status: AC
  Administered 2022-07-22: 4 mg via INTRAVENOUS
  Filled 2022-07-22: qty 2

## 2022-07-22 NOTE — MAU Note (Signed)
..  Sharon Barnett is a 31 y.o. at [redacted]w[redacted]d here in MAU reporting: n/v since Friday morning and has vomited non stop since. Reports more than 40 times in the past 24 hours. Reports sharp lower stomach pain.  Reports she has not been able to void today or yesterday.  Took promethazine and zofran Denies vaginal bleeding.  Pain score: 8/10 Vitals:   07/22/22 2143 07/22/22 2258  BP: 125/86 (!) 140/85  Pulse: (!) 112   Resp: 18 20  Temp: 98.5 F (36.9 C) 98.2 F (36.8 C)  SpO2: 98% 100%     FHT:not indicated Lab orders placed from triage:  ua but not able to void

## 2022-07-22 NOTE — ED Provider Triage Note (Signed)
Emergency Medicine Provider Triage Evaluation Note  Sharon Barnett , a 31 y.o. female  was evaluated in triage.  Pt complains of nausea and vomiting in pregnancy. States she is [redacted] weeks pregnant. Having persistent nausea and vomiting. Unable to keep anything down. Denies having diarrhea, vaginal bleeding or abdominal cramping. She had similar symptoms with previous pregnancy. Taking phenergan without relief.  Review of Systems  Positive: See above Negative:   Physical Exam  BP 125/86 (BP Location: Right Arm)   Pulse (!) 112   Temp 98.5 F (36.9 C)   Resp 18   LMP 05/24/2022 (Exact Date)   SpO2 98%  Gen:   Awake, no distress   Resp:  Normal effort  MSK:   Moves extremities without difficulty  Other:  Abdomen is soft. Dry heaving and hiccuping on exam.   Medical Decision Making  Medically screening exam initiated at 10:09 PM.  Appropriate orders placed.  Merilyn C Yackley was informed that the remainder of the evaluation will be completed by another provider, this initial triage assessment does not replace that evaluation, and the importance of remaining in the ED until their evaluation is complete.  Spoke with Shanda Bumps, MAU APP who agrees to evaluate patient in MAU. Will send for ongoing work up    Cristopher Peru, Cordelia Poche 07/22/22 2212

## 2022-07-22 NOTE — ED Triage Notes (Signed)
Pt arrives via [facility] from home, with c/o nausea/vomiting. Epigastric chest pain. Vomited "20+" times a day. [redacted] weeks pregnant. Has not been able to hold anything down. Experienced similar in prior pregnancy. 124/76, hr 92, 94% o2, 82 cbg

## 2022-07-22 NOTE — ED Notes (Signed)
Report called to MAU, called transport to transport the patient.

## 2022-07-22 NOTE — ED Triage Notes (Signed)
Pt says she has had vomiting. She took the last of her phenergan. Pt reports that she has pain in her chest when she vomits, hiccups, dry heaves, cramping in her lower abdomen. Vomited multiple times today. Denies vaginal bleeding or discharge. Pt stating she is pregnant, due 02/2023

## 2022-07-22 NOTE — MAU Provider Note (Incomplete)
History     CSN: [id number]  Arrival date and time: 07/22/22 1958   Event Date/Time   First Provider Initiated Contact with Patient 07/22/22 2316      Chief Complaint  Patient presents with   Emesis   HPI  Sharon Barnett is a 31 y.o. G2P0000 at [redacted]w[redacted]d who presents for evaluation of nausea and vomiting. Patient reports she ran out of her zofran and phenergan yesterday morning and has been vomiting non stop ever since. She reports she has thrown up more than 40 times. She reports she has not been able to urinate in 2 days. She denies any pain. She denies any vaginal bleeding, discharge, and leaking of fluid. Denies any constipation, diarrhea or any urinary complaints.    OB History     Gravida  2   Para  0   Term  0   Preterm  0   AB  0   Living  0      SAB  0   IAB  0   Ectopic  0   Multiple  0   Live Births              Past Medical History:  Diagnosis Date   ADHD (attention deficit hyperactivity disorder)    Boils    Complication of anesthesia    Hypersensitive to anesthesia (wisdom teeth)   Headache    Hyperemesis gravidarum    Vaginal Pap smear, abnormal     Past Surgical History:  Procedure Laterality Date   WISDOM TOOTH EXTRACTION      Family History  Problem Relation Age of Onset   Depression Mother    Diabetes Mother    Hypertension Mother    Miscarriages / [place] Mother    Asthma Sister    Depression Sister    Hearing loss Sister    Birth defects Sister    Hyperlipidemia Sister    Hypertension Sister    Miscarriages / Stillbirths Sister    Asthma Maternal Grandmother    Cancer Maternal Grandmother    Diabetes Maternal Grandmother    Hypertension Maternal Grandmother    Stroke Maternal Grandmother    Vision loss Maternal Grandmother    Cancer Paternal Grandmother     Social History   Tobacco Use   Smoking status: Never   Smokeless tobacco: Never  Vaping Use   Vaping Use: Never used  Substance Use Topics    Alcohol use: No   Drug use: No    Allergies: No Known Allergies  Medications Prior to Admission  Medication Sig Dispense Refill Last Dose   acetaminophen (TYLENOL) 500 MG tablet Take 2 tablets (1,000 mg total) by mouth every 6 (six) hours as needed. 30 tablet 0    potassium chloride SA (KLOR-CON M) 20 MEQ tablet Take 2 tablets (40 mEq total) by mouth 2 (two) times daily. 20 tablet 0    promethazine (PHENERGAN) 25 MG tablet Take 0.5-1 tablets (12.5-25 mg total) by mouth every 6 (six) hours as needed for nausea or vomiting. 30 tablet 0    scopolamine (TRANSDERM-SCOP) 1 MG/3DAYS Place 1 patch (1.5 mg total) onto the skin every 3 (three) days. 10 patch 0     Review of Systems  Constitutional: Negative.  Negative for fatigue and fever.  HENT: Negative.    Respiratory: Negative.  Negative for shortness of breath.   Cardiovascular: Negative.  Negative for chest pain.  Gastrointestinal:  Positive for nausea and vomiting. Negative for abdominal pain, constipation  and diarrhea.  Genitourinary: Negative.  Negative for dysuria, vaginal bleeding and vaginal discharge.  Neurological: Negative.  Negative for dizziness and headaches.   Physical Exam   Blood pressure (!) 140/85, pulse (!) 112, temperature 98.2 F (36.8 C), temperature source Oral, resp. rate 20, height 5\' 8"  (1.727 m), weight 50.8 kg, last menstrual period 05/24/2022, SpO2 100 %.  Patient Vitals for the past 24 hrs:  BP Temp Temp src Pulse Resp SpO2 Height Weight  07/22/22 2258 (!) 140/85 98.2 F (36.8 C) Oral -- 20 100 % 5\' 8"  (1.727 m) 50.8 kg  07/22/22 2143 125/86 98.5 F (36.9 C) -- (!) 112 18 98 % -- --    Physical Exam Vitals and nursing note reviewed.  Constitutional:      General: She is not in acute distress.    Appearance: She is well-developed.  HENT:     Head: Normocephalic.  Eyes:     Pupils: Pupils are equal, round, and reactive to light.  Cardiovascular:     Rate and Rhythm: Normal rate and regular rhythm.      Heart sounds: Normal heart sounds.  Pulmonary:     Effort: Pulmonary effort is normal. No respiratory distress.     Breath sounds: Normal breath sounds.  Abdominal:     General: Bowel sounds are normal. There is no distension.     Palpations: Abdomen is soft.     Tenderness: There is no abdominal tenderness.  Skin:    General: Skin is warm and dry.  Neurological:     Mental Status: She is alert and oriented to person, place, and time.  Psychiatric:        Mood and Affect: Mood normal.        Behavior: Behavior normal.        Thought Content: Thought content normal.        Judgment: Judgment normal.    MAU Course  Procedures  No results found for this or any previous visit (from the past 24 hour(s)).   No results found.   MDM Labs ordered and reviewed.   UA- unable to leave sample LR bolus Scop patch Reglan Zofran   Assessment and Plan  No diagnosis found.  -Discharge home in stable condition -Rx for *** -*** precautions discussed -Patient advised to follow-up with *** -Patient may return to MAU as needed or if her condition were to change or worsen  07/24/22, CNM 07/22/2022, 11:16 PM

## 2022-07-23 DIAGNOSIS — O219 Vomiting of pregnancy, unspecified: Secondary | ICD-10-CM | POA: Diagnosis not present

## 2022-07-23 DIAGNOSIS — Z3A08 8 weeks gestation of pregnancy: Secondary | ICD-10-CM

## 2022-07-23 MED ORDER — PROMETHAZINE HCL 25 MG PO TABS: 25.0000 mg | ORAL_TABLET | Freq: Four times a day (QID) | ORAL | 2 refills | Status: DC | PRN

## 2022-07-23 MED ORDER — PROMETHAZINE HCL 25 MG RE SUPP: 25.0000 mg | Freq: Four times a day (QID) | RECTAL | 1 refills | Status: DC | PRN

## 2022-07-23 MED ORDER — METOCLOPRAMIDE HCL 10 MG PO TABS: 10.0000 mg | ORAL_TABLET | Freq: Four times a day (QID) | ORAL | 1 refills | Status: DC | PRN

## 2022-07-23 MED ORDER — DOXYLAMINE-PYRIDOXINE 10-10 MG PO TBEC: 2.0000 | DELAYED_RELEASE_TABLET | Freq: Every day | ORAL | 2 refills | Status: DC

## 2022-07-23 MED ORDER — LACTATED RINGERS IV BOLUS
1000.0000 mL | Freq: Once | INTRAVENOUS | Status: AC
  Administered 2022-07-23: 1000 mL via INTRAVENOUS

## 2022-07-23 NOTE — Discharge Instructions (Signed)
  [facility]iders          [facility]  [address], [place], [place] [address]  [phone]  [facility]  [address], [place], [place] [address]  [phone]  [facility]  [address], [place], [place] [address]  [phone]  [facility] [address], [place], [place] [address] [phone]  [facility] [place]  [address], [place], [place] [address]  [phone]  [facility]  [address]First floor), [place], [place] [address]  [phone]  [facility]  [address], [place], [place] [address]  [phone]  [facility]  [address], [place], [place] [address]  [phone]  [facility]  [address], [place], [place] [address]  [phone]  [facility]  [address], [place], [place] [address]  [phone]  [facility]  [address], [place], [place] [address]  [phone]  [facility]  [address], [place], [place] [address]  [phone]  [facility]   [address], [place], [place] [address]  [phone]  [facility]  [address], [place], [place] [address]   [phone]  [facility] [address], , [place] [address] [phone]  [facility]  [address], , [place] [address]  [phone]         

## 2022-08-02 ENCOUNTER — Encounter: Payer: Self-pay | Admitting: *Deleted

## 2022-08-02 ENCOUNTER — Telehealth: Payer: Self-pay

## 2022-08-02 NOTE — Telephone Encounter (Signed)
Patient calls nurse line requesting to schedule new patient appointment to establish PCP and start depo.   Per chart review, patient was recently seen for positive pregnancy test in MAU. Patient reports having abortion on 07/25/22.  Scheduled with Dr. Sherrilee Gilles for new patient appointment on 12/4. Advised patient to arrive early for appointment to complete new patient paperwork.   Veronda Prude, RN

## 2022-08-07 ENCOUNTER — Encounter: Payer: Self-pay | Admitting: Family Medicine

## 2022-08-07 ENCOUNTER — Ambulatory Visit (INDEPENDENT_AMBULATORY_CARE_PROVIDER_SITE_OTHER): Payer: Medicaid Other | Admitting: Family Medicine

## 2022-08-07 VITALS — BP 112/66 | HR 80 | Wt 124.5 lb

## 2022-08-07 DIAGNOSIS — Z30013 Encounter for initial prescription of injectable contraceptive: Secondary | ICD-10-CM | POA: Diagnosis not present

## 2022-08-07 DIAGNOSIS — Z3009 Encounter for other general counseling and advice on contraception: Secondary | ICD-10-CM

## 2022-08-07 LAB — POCT URINE PREGNANCY: Preg Test, Ur: POSITIVE — AB

## 2022-08-07 MED ORDER — MEDROXYPROGESTERONE ACETATE 150 MG/ML IM SUSP
150.0000 mg | Freq: Once | INTRAMUSCULAR | Status: AC
  Administered 2022-08-07: 150 mg via INTRAMUSCULAR

## 2022-08-07 NOTE — Progress Notes (Unsigned)
    SUBJECTIVE:   CHIEF COMPLAINT / HPI:  Sharon Barnett is a 31yo F here to establish care.  She does not have a prior PCP.  She recently had a medication induced abortion on 11/21 for an 8-week and 6-day fetus.  She was having uncontrolled hyperemesis gravidarum leading to weight loss and electrolyte abnormalities, thus requiring abortion.  After this procedure, she had some vaginal bleeding that has since decreased to light bleeding, pink discharge.  She is also having some abnormal vaginal smell, but not necessarily malodorous or fishy.  Reports some mild cramping that is improving.  Denies fever, dysuria, urinary frequency.  Not sexually active since 11/21.  Abortion was done at [facility] on [address].  Had severe hyperemesis gravidarum during her prior pregnancy.  Interested in starting Depo. Had it before when younger, does not remember any bad reactions. Her sisters also recommend depo to her.  Gyn Hx Prior to pregnancy, she was getting monthly periods.   Has sex with one primary partner, monogamous. Has unprotected sex.   2 pregnancies. Has 1 6yo son.  PMHx - Hyperemesis gravidum with both pregnancies - Surgical: Wisdom teeth removal  FamHx Mom: diabetes, thyroid issues Grandma: HTN Dad: skin cancer  Social Hx Lives with son and sister. Used to at [employer], but quitting soon. Also working full time at [employer]. - Tobacco: None - Has hx of mariajuana use - No alcohol   NKDA  PERTINENT  PMH / PSH: as above  OBJECTIVE:   BP 112/66   Pulse 80   Wt 124 lb 8 oz (56.5 kg)   LMP 05/24/2022 (Exact Date)   SpO2 100%   BMI 18.93 kg/m   Gen: pleasant, alert, NAD HEENT: NCAT, MMM Resp: CTAB, no wheezing or crackles.  Normal WOB on RA CV: RRR no murmurs.  2+ radial pulses  ASSESSMENT/PLAN:   Encounter for birth control S/p medication-induced abortion 11/21. Now having light, pink spotting and mild cramping, but overall improving and low concern for excessive bleeding or  infection.  Denies sexual activity since 11/21, thus I am reasonably sure that patient is not pregnant.  Urine pregnancy test positive, which is likely false positive.  Patient is interested in starting Depo-Provera.  She has used this previously when she was younger, denies bad reaction to it. -Depo-Provera today, then every 3 months -Advised to continue using other forms of protection for 7 days - Provided return precautions if vaginal bleeding or fever occurs   Pt needs pap smear. Discussed performing in 3 months when she returns for depo.  Lincoln Brigham, MD [facility]

## 2022-08-07 NOTE — Patient Instructions (Signed)
Good to see you today - Thank you for coming in  Things we discussed today:  1) You are receiving the Depo Provera shot today.  - Please continue to use condoms for 7 days after the shot. - You will need a repeat injection every 3 months.  2) Abnormal vaginal smells and light spotting is normal for a few weeks after your procedure.  - Concerning symptoms would be fevers (temp greater than 100.58F) and pain  3) Your pregnancy test can still be positive for a 4-6 weeks after your procedure. This is normal and is NOT a true pregnancy.   Please always bring your medication bottles  Come back to see me in the next 3 months to get your Pap and check in on how you are doing on Depo Provera.

## 2022-08-08 DIAGNOSIS — Z309 Encounter for contraceptive management, unspecified: Secondary | ICD-10-CM | POA: Insufficient documentation

## 2022-08-08 NOTE — Assessment & Plan Note (Addendum)
S/p medication-induced abortion 11/21. Now having light, pink spotting and mild cramping, but overall improving and low concern for excessive bleeding or infection.  Denies sexual activity since 11/21, thus I am reasonably sure that patient is not pregnant.  Urine pregnancy test positive, which is likely false positive.  Patient is interested in starting Depo-Provera.  She has used this previously when she was younger, denies bad reaction to it. -Depo-Provera today, then every 3 months -Advised to continue using other forms of protection for 7 days - Provided return precautions if vaginal bleeding or fever occurs

## 2022-08-22 ENCOUNTER — Telehealth: Payer: Medicaid Other

## 2022-10-25 ENCOUNTER — Ambulatory Visit: Payer: Medicaid Other

## 2022-11-02 ENCOUNTER — Ambulatory Visit (INDEPENDENT_AMBULATORY_CARE_PROVIDER_SITE_OTHER): Payer: Medicaid Other | Admitting: *Deleted

## 2022-11-02 DIAGNOSIS — Z30013 Encounter for initial prescription of injectable contraceptive: Secondary | ICD-10-CM

## 2022-11-02 MED ORDER — MEDROXYPROGESTERONE ACETATE 150 MG/ML IM SUSY
150.0000 mg | PREFILLED_SYRINGE | Freq: Once | INTRAMUSCULAR | Status: AC
  Administered 2022-11-02: 150 mg via INTRAMUSCULAR

## 2022-11-02 NOTE — Progress Notes (Signed)
Patient here today for Depo Provera injection and is within her dates.    Last contraceptive appt was 08/07/22    Depo given in [place] today.  Site unremarkable & patient tolerated injection.    Next injection due 01/18/23 - 02/01/23.  Reminder card given.    Christen Bame, CMA

## 2022-11-16 ENCOUNTER — Ambulatory Visit (INDEPENDENT_AMBULATORY_CARE_PROVIDER_SITE_OTHER): Payer: Medicaid Other | Admitting: Family Medicine

## 2022-11-16 VITALS — BP 114/64 | HR 63 | Ht 68.0 in | Wt 148.0 lb

## 2022-11-16 DIAGNOSIS — N921 Excessive and frequent menstruation with irregular cycle: Secondary | ICD-10-CM | POA: Diagnosis present

## 2022-11-16 MED ORDER — NORGESTIMATE-ETH ESTRADIOL 0.25-35 MG-MCG PO TABS: 1.0000 | ORAL_TABLET | Freq: Every day | ORAL | 5 refills | Status: DC

## 2022-11-16 NOTE — Progress Notes (Signed)
    SUBJECTIVE:   CHIEF COMPLAINT / HPI:  Chief Complaint  Patient presents with   Vaginal Bleeding   Depression    Patient started on Depo-Provera for birth control 3 months ago on 08/2022.  Last injection was 2/29.  She reports she has had ongoing vaginal bleeding for the past 6 weeks occurring daily. Noticing clots when on the toilet. Frequently changing pads, no soaked pads. Noticing some changes in her mood since starting the Depo.  PERTINENT  PMH / PSH:   Patient Care Team: Arlyce Dice, MD as PCP - General (Family Medicine)   OBJECTIVE:   BP 114/64   Pulse 63   Ht 5\' 8"  (1.727 m)   Wt 148 lb (67.1 kg)   LMP 10/20/2022   SpO2 100%   BMI 22.50 kg/m   Physical Exam Constitutional:      General: She is not in acute distress. Cardiovascular:     Rate and Rhythm: Normal rate and regular rhythm.  Pulmonary:     Effort: Pulmonary effort is normal. No respiratory distress.     Breath sounds: Normal breath sounds.  Musculoskeletal:     Cervical back: Neck supple.  Neurological:     Mental Status: She is alert.         11/16/2022    2:29 PM  Depression screen PHQ 2/9  Decreased Interest 3  Down, Depressed, Hopeless 1  PHQ - 2 Score 4  Altered sleeping 0  Tired, decreased energy 3  Change in appetite 0  Feeling bad or failure about yourself  0  Trouble concentrating 0  Moving slowly or fidgety/restless 0  Suicidal thoughts 0  PHQ-9 Score 7  Difficult doing work/chores Not difficult at all     {Show previous vital signs (optional):23777}    ASSESSMENT/PLAN:   1. Breakthrough bleeding on Depo-Provera She is having significant daily vaginal bleeding for the past 6 weeks related to initiation of Depo-Provera.  Will initiate trial short course of OCP for a few months to help reduce bleeding.  Could consider trial off of OCP once bleeding is improved. - norgestimate-ethinyl estradiol (SPRINTEC 28) 0.25-35 MG-MCG tablet; Take 1 tablet by mouth daily.   Dispense: 28 tablet; Refill: 5   - consider checking CBC given vaginal bleeding, patient declined  Return in about 3 months (around 02/16/2023) for f/u Depo, vaginal bleeding.   Zola Button, MD [place]

## 2022-11-16 NOTE — Patient Instructions (Addendum)
It was nice seeing you today!  Take birth control pill as prescribed to help with the bleeding.  Stay well, Zola Button, MD [place] [phone]  --  Make sure to check out at the front desk before you leave today.  Please arrive at least 15 minutes prior to your scheduled appointments.  If you had blood work today, I will send you a MyChart message or a letter if results are normal. Otherwise, I will give you a call.  If you had a referral placed, they will call you to set up an appointment. Please give [place] a call if you don't hear back in the next 2 weeks.  If you need additional refills before your next appointment, please call your pharmacy first.

## 2022-11-26 ENCOUNTER — Emergency Department (HOSPITAL_COMMUNITY): Payer: Medicaid Other

## 2022-11-26 ENCOUNTER — Encounter (HOSPITAL_COMMUNITY): Payer: Self-pay

## 2022-11-26 ENCOUNTER — Emergency Department (HOSPITAL_COMMUNITY)
Admission: EM | Admit: 2022-11-26 | Discharge: 2022-11-26 | Disposition: A | Discharge status: Home / Self Care | Payer: Medicaid Other | Attending: Emergency Medicine | Admitting: Emergency Medicine

## 2022-11-26 ENCOUNTER — Other Ambulatory Visit: Payer: Self-pay

## 2022-11-26 DIAGNOSIS — R109 Unspecified abdominal pain: Secondary | ICD-10-CM | POA: Diagnosis not present

## 2022-11-26 DIAGNOSIS — E876 Hypokalemia: Secondary | ICD-10-CM | POA: Insufficient documentation

## 2022-11-26 DIAGNOSIS — R112 Nausea with vomiting, unspecified: Secondary | ICD-10-CM | POA: Diagnosis not present

## 2022-11-26 DIAGNOSIS — D649 Anemia, unspecified: Secondary | ICD-10-CM | POA: Insufficient documentation

## 2022-11-26 DIAGNOSIS — R1084 Generalized abdominal pain: Secondary | ICD-10-CM | POA: Diagnosis not present

## 2022-11-26 DIAGNOSIS — R1111 Vomiting without nausea: Secondary | ICD-10-CM | POA: Diagnosis not present

## 2022-11-26 DIAGNOSIS — R110 Nausea: Secondary | ICD-10-CM | POA: Diagnosis not present

## 2022-11-26 DIAGNOSIS — R001 Bradycardia, unspecified: Secondary | ICD-10-CM | POA: Diagnosis not present

## 2022-11-26 DIAGNOSIS — R58 Hemorrhage, not elsewhere classified: Secondary | ICD-10-CM | POA: Diagnosis not present

## 2022-11-26 DIAGNOSIS — R9431 Abnormal electrocardiogram [ECG] [EKG]: Secondary | ICD-10-CM | POA: Diagnosis not present

## 2022-11-26 LAB — COMPREHENSIVE METABOLIC PANEL
ALT: 10 U/L (ref 0–44)
AST: 13 U/L — ABNORMAL LOW (ref 15–41)
Albumin: 3.3 g/dL — ABNORMAL LOW (ref 3.5–5.0)
Alkaline Phosphatase: 35 U/L — ABNORMAL LOW (ref 38–126)
Anion gap: 10 (ref 5–15)
BUN: 14 mg/dL (ref 6–20)
CO2: 14 mmol/L — ABNORMAL LOW (ref 22–32)
Calcium: 6.9 mg/dL — ABNORMAL LOW (ref 8.9–10.3)
Chloride: 117 mmol/L — ABNORMAL HIGH (ref 98–111)
Creatinine, Ser: 0.62 mg/dL (ref 0.44–1.00)
GFR, Estimated: >60 mL/min (ref >60)
Glucose, Bld: 55 mg/dL — ABNORMAL LOW (ref 70–99)
Potassium: 3 mmol/L — ABNORMAL LOW (ref 3.5–5.1)
Sodium: 141 mmol/L (ref 135–145)
Total Bilirubin: 0.8 mg/dL (ref 0.3–1.2)
Total Protein: 5.7 g/dL — ABNORMAL LOW (ref 6.5–8.1)

## 2022-11-26 LAB — CBC WITH DIFFERENTIAL/PLATELET
Abs Immature Granulocytes: 0.02 10*3/uL (ref 0.00–0.07)
Basophils Absolute: 0 10*3/uL (ref 0.0–0.1)
Basophils Relative: 0 %
Eosinophils Absolute: 0 10*3/uL (ref 0.0–0.5)
Eosinophils Relative: 0 %
HCT: 34.3 % — ABNORMAL LOW (ref 36.0–46.0)
Hemoglobin: 11.3 g/dL — ABNORMAL LOW (ref 12.0–15.0)
Immature Granulocytes: 0 %
Lymphocytes Relative: 14 %
Lymphs Abs: 1 10*3/uL (ref 0.7–4.0)
MCH: 29.8 pg (ref 26.0–34.0)
MCHC: 32.9 g/dL (ref 30.0–36.0)
MCV: 90.5 fL (ref 80.0–100.0)
Monocytes Absolute: 0.2 10*3/uL (ref 0.1–1.0)
Monocytes Relative: 2 %
Neutro Abs: 6.2 10*3/uL (ref 1.7–7.7)
Neutrophils Relative %: 84 %
Platelets: 246 10*3/uL (ref 150–400)
RBC: 3.79 MIL/uL — ABNORMAL LOW (ref 3.87–5.11)
RDW: 12.3 % (ref 11.5–15.5)
WBC: 7.4 10*3/uL (ref 4.0–10.5)
nRBC: 0 % (ref 0.0–0.2)

## 2022-11-26 LAB — LIPASE, BLOOD: Lipase: 24 U/L (ref 11–51)

## 2022-11-26 LAB — I-STAT BETA HCG BLOOD, ED (MC, WL, AP ONLY): I-stat hCG, quantitative: <5 m[IU]/mL (ref <5)

## 2022-11-26 MED ORDER — POTASSIUM CHLORIDE CRYS ER 20 MEQ PO TBCR
40.0000 meq | EXTENDED_RELEASE_TABLET | Freq: Once | ORAL | Status: AC
  Administered 2022-11-26: 40 meq via ORAL
  Filled 2022-11-26: qty 2

## 2022-11-26 MED ORDER — SODIUM CHLORIDE 0.9 % IV BOLUS
1000.0000 mL | Freq: Once | INTRAVENOUS | Status: AC
  Administered 2022-11-26: 1000 mL via INTRAVENOUS

## 2022-11-26 MED ORDER — IOHEXOL 300 MG/ML  SOLN
100.0000 mL | Freq: Once | INTRAMUSCULAR | Status: AC | PRN
  Administered 2022-11-26: 100 mL via INTRAVENOUS

## 2022-11-26 MED ORDER — ONDANSETRON HCL 4 MG/2ML IJ SOLN
4.0000 mg | Freq: Once | INTRAMUSCULAR | Status: AC
  Administered 2022-11-26: 4 mg via INTRAVENOUS
  Filled 2022-11-26: qty 2

## 2022-11-26 MED ORDER — ONDANSETRON HCL 4 MG PO TABS: 4.0000 mg | ORAL_TABLET | Freq: Four times a day (QID) | ORAL | 0 refills | Status: DC

## 2022-11-26 MED ORDER — CALCIUM CARBONATE ANTACID 500 MG PO CHEW
800.0000 mg | CHEWABLE_TABLET | Freq: Once | ORAL | Status: AC
  Administered 2022-11-26: 800 mg via ORAL
  Filled 2022-11-26: qty 4

## 2022-11-26 NOTE — Discharge Instructions (Addendum)
Thank you for allow me to be part of your care today.  I have sent a prescription for Zofran over to your pharmacy.  Please take this as needed for nausea and vomiting.  I recommend following up with your OB/GYN regarding your vaginal bleeding and nausea since starting your oral contraceptive.  Return to the ED if you have worsening of your symptoms, or unable to keep down liquids despite medication, or if you have any new concerns.  Please begin taking a supplement of calcium as your calcium was low.

## 2022-11-26 NOTE — ED Provider Notes (Signed)
[place] EMERGENCY DEPARTMENT AT [facility] Provider Note   CSN: [id number] Arrival date & time: 11/26/22  1505     History {Add pertinent medical, surgical, social history, OB history to HPI:1} Chief Complaint  Patient presents with  . Nausea    Sharon Barnett is a 32 y.o. female with past medical history as outlined below presents to the ED complaining of nausea and vomiting.  She states she has had nausea since beginning an oral contraceptive 1 week ago, but the vomiting and abdominal pain started today.  She reports she has had abnormal vaginal bleeding for the past 9 months, which is why she was started on an OCP.  She has been around others with similar symptoms, but she believes their symptoms were related to alcohol. Denies fever, chills, diarrhea, dizziness, headache, lightheadedness, syncope, weakness, dysuria.  Past Medical History:  Diagnosis Date  . ADHD (attention deficit hyperactivity disorder)   . Boils   . Complication of anesthesia    Hypersensitive to anesthesia (wisdom teeth)  . Headache   . Hyperemesis gravidarum   . Vaginal Pap smear, abnormal        Home Medications Prior to Admission medications   Medication Sig Start Date End Date Taking? Authorizing Provider  acetaminophen (TYLENOL) 500 MG tablet Take 2 tablets (1,000 mg total) by mouth every 6 (six) hours as needed. 02/21/22   Talbot Grumbling, FNP  Doxylamine-Pyridoxine 10-10 MG TBEC Take 2 tablets by mouth at bedtime. 07/23/22   Gavin Pound, CNM  metoCLOPramide (REGLAN) 10 MG tablet Take 1 tablet (10 mg total) by mouth every 6 (six) hours as needed for nausea. 07/23/22   Gavin Pound, CNM  norgestimate-ethinyl estradiol (SPRINTEC 28) 0.25-35 MG-MCG tablet Take 1 tablet by mouth daily. 11/16/22   Zola Button, MD  potassium chloride SA (KLOR-CON M) 20 MEQ tablet Take 2 tablets (40 mEq total) by mouth 2 (two) times daily. 07/10/22   Julianne Handler, CNM  promethazine (PHENERGAN) 25  MG suppository Place 1 suppository (25 mg total) rectally every 6 (six) hours as needed for nausea or vomiting. If unable to take oral tablet 07/23/22   Gavin Pound, CNM  promethazine (PHENERGAN) 25 MG tablet Take 1 tablet (25 mg total) by mouth every 6 (six) hours as needed for nausea or vomiting. 07/23/22   Gavin Pound, CNM  scopolamine (TRANSDERM-SCOP) 1 MG/3DAYS Place 1 patch (1.5 mg total) onto the skin every 3 (three) days. 07/13/22   Julianne Handler, CNM  zolpidem (AMBIEN CR) 6.25 MG CR tablet Take 1 tablet (6.25 mg total) by mouth at bedtime as needed for sleep. Patient not taking: Reported on 02/04/2019 07/11/16 05/20/19  Steve Rattler, DO      Allergies    Patient has no known allergies.    Review of Systems   Review of Systems  Constitutional:  Positive for chills. Negative for fever.  Gastrointestinal:  Positive for abdominal pain, nausea and vomiting. Negative for diarrhea.  Genitourinary:  Positive for vaginal bleeding (for 9 months). Negative for dysuria.  Neurological:  Negative for dizziness, syncope, weakness, light-headedness and headaches.    Physical Exam Updated Vital Signs BP 114/65   Pulse (!) 50   Temp 98 F (36.7 C)   Resp 17   Ht 5\' 8"  (1.727 m)   Wt 67.1 kg   LMP  (LMP Unknown)   SpO2 96%   BMI 22.50 kg/m  Physical Exam Vitals and nursing note reviewed.  Constitutional:  General: She is not in acute distress.    Appearance: She is not ill-appearing.  HENT:     Mouth/Throat:     Mouth: Mucous membranes are moist.     Pharynx: Oropharynx is clear.  Cardiovascular:     Rate and Rhythm: Normal rate and regular rhythm.     Pulses: Normal pulses.     Heart sounds: Normal heart sounds.  Pulmonary:     Effort: Pulmonary effort is normal. No respiratory distress.     Breath sounds: Normal breath sounds and air entry.  Abdominal:     General: Abdomen is flat. Bowel sounds are normal. There is no distension.     Palpations: Abdomen is soft.      Tenderness: There is generalized abdominal tenderness.  Skin:    General: Skin is warm and dry.     Capillary Refill: Capillary refill takes less than 2 seconds.  Neurological:     Mental Status: She is alert. Mental status is at baseline.  Psychiatric:        Mood and Affect: Mood normal.        Behavior: Behavior normal.    ED Results / Procedures / Treatments   Labs (all labs ordered are listed, but only abnormal results are displayed) Labs Reviewed  COMPREHENSIVE METABOLIC PANEL - Abnormal; Notable for the following components:      Result Value   Potassium 3.0 (*)    Chloride 117 (*)    CO2 14 (*)    Glucose, Bld 55 (*)    Calcium 6.9 (*)    Total Protein 5.7 (*)    Albumin 3.3 (*)    AST 13 (*)    Alkaline Phosphatase 35 (*)    All other components within normal limits  CBC WITH DIFFERENTIAL/PLATELET - Abnormal; Notable for the following components:   RBC 3.79 (*)    Hemoglobin 11.3 (*)    HCT 34.3 (*)    All other components within normal limits  LIPASE, BLOOD  URINALYSIS, ROUTINE W REFLEX MICROSCOPIC  MAGNESIUM  I-STAT BETA HCG BLOOD, ED ([facility], [facility], [facility] ONLY)    EKG EKG Interpretation  Date/Time:  Sunday November 26 2022 16:27:51 EDT Ventricular Rate:  65 PR Interval:  140 QRS Duration: 88 QT Interval:  433 QTC Calculation: 451 R Axis:   78 Text Interpretation: Sinus rhythm Nonspecific T abnormalities, anterior leads Confirmed by Lacretia Leigh ([id number]) on 11/26/2022 5:07:22 PM  Radiology No results found.  Procedures Procedures  {Document cardiac monitor, telemetry assessment procedure when appropriate:1}  Medications Ordered in ED Medications  calcium carbonate (TUMS - dosed in mg elemental calcium) chewable tablet 800 mg of elemental calcium (has no administration in time range)  potassium chloride SA (KLOR-CON M) CR tablet 40 mEq (has no administration in time range)  sodium chloride 0.9 % bolus 1,000 mL (1,000 mLs Intravenous New Bag/Given 11/26/22  1649)  ondansetron (ZOFRAN) injection 4 mg (4 mg Intravenous Given 11/26/22 1649)    ED Course/ Medical Decision Making/ A&P   {   Click here for ABCD2, HEART and other calculatorsREFRESH Note before signing :1}                          Medical Decision Making Amount and/or Complexity of Data Reviewed Labs: ordered. Radiology: ordered.  Risk OTC drugs. Prescription drug management.   ***  {Document critical care time when appropriate:1} {Document review of labs and clinical decision tools ie heart score,  Chads2Vasc2 etc:1}  {Document your independent review of radiology images, and any outside records:1} {Document your discussion with family members, caretakers, and with consultants:1} {Document social determinants of health affecting pt's care:1} {Document your decision making why or why not admission, treatments were needed:1} Final Clinical Impression(s) / ED Diagnoses Final diagnoses:  None    Rx / DC Orders ED Discharge Orders     None

## 2022-11-26 NOTE — ED Notes (Signed)
Patient provided with ice water, she states she has had a few ice chips.

## 2022-11-26 NOTE — ED Triage Notes (Signed)
PER EMS: pt reports nausea and vomiting that started when she started taking an oral contraceptive about a week ago. The vomiting started today. She had an elective abortion about 2 months ago and has been having vaginal bleeding for the last 9 months. 4mg  Zofran IV given en route and 500cc NaCl  BP-  120/86, HR-55, O2-98%, CBG-82

## 2023-02-15 ENCOUNTER — Encounter: Payer: Self-pay | Admitting: Family Medicine

## 2023-02-15 ENCOUNTER — Ambulatory Visit (INDEPENDENT_AMBULATORY_CARE_PROVIDER_SITE_OTHER): Payer: Medicaid Other | Admitting: Family Medicine

## 2023-02-15 ENCOUNTER — Other Ambulatory Visit: Payer: Self-pay

## 2023-02-15 VITALS — BP 110/70 | HR 88 | Ht 68.0 in | Wt 147.2 lb

## 2023-02-15 DIAGNOSIS — Z30018 Encounter for initial prescription of other contraceptives: Secondary | ICD-10-CM

## 2023-02-15 DIAGNOSIS — R5383 Other fatigue: Secondary | ICD-10-CM

## 2023-02-15 DIAGNOSIS — N926 Irregular menstruation, unspecified: Secondary | ICD-10-CM | POA: Diagnosis not present

## 2023-02-15 DIAGNOSIS — N939 Abnormal uterine and vaginal bleeding, unspecified: Secondary | ICD-10-CM | POA: Insufficient documentation

## 2023-02-15 LAB — POCT URINE PREGNANCY: Preg Test, Ur: NEGATIVE

## 2023-02-15 NOTE — Patient Instructions (Signed)
It was wonderful to see you today. Thank you for allowing me to be a part of your care. Below is a short summary of what we discussed at your visit today:  Irregular bleeding Today we discussed forms of birth control and you prefer to be off all hormonal birth control and see how your body does.  Since you do not want to become pregnant in the next year, we do recommend some sort of protection in terms of condoms, diaphragms, female condoms, spermicide, fertility awareness, or another nonhormonal method of your choosing.  There are many websites you can visit to explore the different kinds of nonhormonal birth control.  [facility] is a great place to start, their website explains a lot of different details.  Possible anemia, fatigue Labs today to check in on possible anemia, vitamin deficiency, or thyroid disorder. If the results are normal, I will send you a letter or MyChart message. If the results are abnormal, I will give you a call.    Health Maintenance We like to think about ways to keep you healthy for years to come. Below are some interventions and screenings we can offer to keep you healthy: -Pap smear to screen for cervical cancer -COVID booster vaccine -Tdap vaccine   Please bring all of your medications to every appointment!  If you have any questions or concerns, please do not hesitate to contact us via phone or MyChart message.   Fayette Pho, MD

## 2023-02-15 NOTE — Assessment & Plan Note (Signed)
Subacute, insidious worsening.  Wonder if related to possible anemia secondary to irregular menstrual bleeding on Depo Provera.  Will obtain some blood work today including CBC, BMP, TSH, B12, and folate to rule out common causes.  Will follow results and treat accordingly.

## 2023-02-15 NOTE — Progress Notes (Signed)
SUBJECTIVE:   CHIEF COMPLAINT / HPI:   Irregular bleeding, fatigue Sharon Barnett is a pleasant 32 year old woman who presents today with her son with concern of irregular bleeding and fatigue.  Has been bleeding irregularly on her Depo shot, last Depo in March.  At that time, was recommended to use Sprintec on top of her Depo shot to help with the bleeding.  She reports she used the Sprintec for 1 month but never went back to get more because she did not find it to help.  Reports bleeding that will "come out of nowhere".  Last week, had bleeding discharge that was brownish and reddish in color, not bright red blood like she is used to for normal period.  Also endorses significant fatigue.  Last CBC done during the ED visit shows mild anemia.    Today she reports that she would not like to be on any sort of hormonal birth control, but does not desire pregnancy in the next year.  She reports she actually would not like any more children.  She is currently sexually active with 1 partner. Would prefer condoms, however her partner does not like to use these.  She does not believe her partner would like to undergo vasectomy, he just had his first child 1 year ago.  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   ATTENTION DEFICIT, W/HYPERACTIVITY 11/01/2006    OBJECTIVE:   BP 110/70   Pulse 88   Ht 5\' 8"  (1.727 m)   Wt 147 lb 3.2 oz (66.8 kg)   SpO2 98%   BMI 22.38 kg/m    PHQ-9:     02/15/2023    9:04 AM 11/16/2022    2:29 PM 08/07/2022    4:56 PM  Depression screen PHQ 2/9  Decreased Interest 0 3 1  Down, Depressed, Hopeless 0 1 0  PHQ - 2 Score 0 4 1  Altered sleeping 0 0 0  Tired, decreased energy 3 3 0  Change in appetite 1 0 0  Feeling bad or failure about yourself  0 0 0  Trouble concentrating 0 0 0  Moving slowly or  fidgety/restless 0 0 0  Suicidal thoughts 0 0 0  PHQ-9 Score 4 7 1   Difficult doing work/chores  Not difficult at all Not difficult at all    Physical Exam General: Awake, alert, oriented Neck: Thyroid unremarkable to palpation Cardiovascular: Regular rate and rhythm, S1 and S2 present, no murmurs auscultated Respiratory: Lung fields clear to auscultation bilaterally Neuro: Cranial nerves II through X grossly intact, able to move all extremities spontaneously   ASSESSMENT/PLAN:   Irregular bleeding Irregular bleeding on Depo, unimproved with 1 month of Sprintec used concurrently.  Patient would like to be off of all hormonal both birth control and politely declines any additional prescriptions today.  Other fatigue Subacute, insidious worsening.  Wonder if related to possible anemia secondary to irregular menstrual bleeding on Depo Provera.  Will obtain some blood work today including CBC, BMP, TSH, B12, and folate to rule out common causes.  Will follow results and treat accordingly.  Encounter for birth control Patient desires to be off of all hormonal birth control but does not desire any future pregnancies.  Using visual aids in clinic room, discussed other forms of nonhormonal birth control including female condoms,  female condoms, diaphragm, spermicide, and fertility awareness methods.  Also discussed possibility of surgical intervention such as tying her tubes.  Information provided in AVS, encouraged patient to explore these options and let us know if she would like a referral to gynecology for discussion of bilateral salpingectomy.     Fayette Pho, MD [facility]

## 2023-02-15 NOTE — Assessment & Plan Note (Signed)
Irregular bleeding on Depo, unimproved with 1 month of Sprintec used concurrently.  Patient would like to be off of all hormonal both birth control and politely declines any additional prescriptions today.

## 2023-02-15 NOTE — Assessment & Plan Note (Signed)
Patient desires to be off of all hormonal birth control but does not desire any future pregnancies.  Using visual aids in clinic room, discussed other forms of nonhormonal birth control including female condoms, female condoms, diaphragm, spermicide, and fertility awareness methods.  Also discussed possibility of surgical intervention such as tying her tubes.  Information provided in AVS, encouraged patient to explore these options and let us know if she would like a referral to gynecology for discussion of bilateral salpingectomy.

## 2023-02-16 LAB — BASIC METABOLIC PANEL
BUN/Creatinine Ratio: 12 (ref 9–23)
BUN: 11 mg/dL (ref 6–20)
CO2: 22 mmol/L (ref 20–29)
Calcium: 9.1 mg/dL (ref 8.7–10.2)
Chloride: 105 mmol/L (ref 96–106)
Creatinine, Ser: 0.95 mg/dL (ref 0.57–1.00)
Glucose: 89 mg/dL (ref 70–99)
Potassium: 3.8 mmol/L (ref 3.5–5.2)
Sodium: 139 mmol/L (ref 134–144)
eGFR: 83 mL/min/{1.73_m2} (ref >59)

## 2023-02-16 LAB — CBC
Hematocrit: 37.5 % (ref 34.0–46.6)
Hemoglobin: 12.3 g/dL (ref 11.1–15.9)
MCH: 29.9 pg (ref 26.6–33.0)
MCHC: 32.8 g/dL (ref 31.5–35.7)
MCV: 91 fL (ref 79–97)
Platelets: 279 10*3/uL (ref 150–450)
RBC: 4.11 x10E6/uL (ref 3.77–5.28)
RDW: 11.7 % (ref 11.7–15.4)
WBC: 4.1 10*3/uL (ref 3.4–10.8)

## 2023-02-16 LAB — TSH RFX ON ABNORMAL TO FREE T4: TSH: 0.469 u[IU]/mL (ref 0.450–4.500)

## 2023-02-16 LAB — FOLATE: Folate: 6.8 ng/mL (ref >3.0)

## 2023-02-16 LAB — RPR: RPR Ser Ql: NONREACTIVE

## 2023-02-16 LAB — VITAMIN B12: Vitamin B-12: 368 pg/mL (ref 232–1245)

## 2023-02-16 LAB — HIV ANTIBODY (ROUTINE TESTING W REFLEX): HIV Screen 4th Generation wRfx: NONREACTIVE

## 2023-02-21 ENCOUNTER — Ambulatory Visit (INDEPENDENT_AMBULATORY_CARE_PROVIDER_SITE_OTHER): Payer: Medicaid Other | Admitting: Family Medicine

## 2023-02-21 VITALS — BP 113/75 | HR 61 | Ht 68.0 in | Wt 143.5 lb

## 2023-02-21 DIAGNOSIS — R519 Headache, unspecified: Secondary | ICD-10-CM | POA: Diagnosis present

## 2023-02-21 MED ORDER — IBUPROFEN 600 MG PO TABS: 600.0000 mg | ORAL_TABLET | Freq: Three times a day (TID) | ORAL | 0 refills | Status: DC | PRN

## 2023-02-21 NOTE — Patient Instructions (Addendum)
It was great to meet you!  I have sent some prescription strength Ibuprofen to your pharmacy. Take one tablet every 8 hours (3 times per day) over the next few days. I also recommend using a heating pad on your neck and getting at least 8 hrs of sleep.  If your symptoms are getting worse despite the Ibuprofen or if you notice any new concerns such as fever, vomiting, vision changes, etc. please let us know right away.   Take care, Dr Anner Crete

## 2023-02-21 NOTE — Progress Notes (Signed)
    SUBJECTIVE:   CHIEF COMPLAINT / HPI:   Headache For 4 days. Frontal. Hasn't tried anything for relief. States "I don't take medication unless I really have to". Rates headache 9/10. Has continued to work/take care of her son since headache onset. Also some posterior right sided neck pain. No fever. No vision changes, photophobia, nausea, or vomiting. No URI symptoms.  She reports a previous h/o headaches when she was 32 years old which always improved with going to sleep. States now she can't do that because she works and takes care of her child.  Currently getting ~5 hrs sleep per night. Drinking plenty of water.  PERTINENT  PMH / PSH: none pertinent  OBJECTIVE:   BP 113/75   Pulse 61   Ht 5\' 8"  (1.727 m)   Wt 143 lb 8 oz (65.1 kg)   LMP 02/15/2023   SpO2 100%   BMI 21.82 kg/m   Gen: NAD, able to participate in exam HEENT: [place]/AT, PERRLA, no photophobia, no nystagmus, nares patent bilaterally, TM normal bilaterally, oropharynx unremarkable Neck: no cervical or supraclavicular lymphadenopathy, tenderness to inferior paracervical musculature on right, FROM although flexion, extension, and rightward turning elicit some pain, no meningismus CV: RRR, normal S1/S2, no murmur Resp: Normal effort, lungs CTAB Extremities: no edema or cyanosis Skin: warm and dry, no rashes noted Neuro: alert, CN II-XII intact, normal gait, normal speech Psych: Normal affect and mood   ASSESSMENT/PLAN:   Acute nonintractable headache 4 days of frontal headache. No red flags. Suspect cervicogenic headache vs tension headache. As patient has not tried anything for relief, advised Ibuprofen 600mg  q8h and heating pad use. Also encouraged adequate sleep. Return precautions reviewed.    Maury Dus, MD [facility]

## 2023-02-22 DIAGNOSIS — R519 Headache, unspecified: Secondary | ICD-10-CM | POA: Insufficient documentation

## 2023-02-22 NOTE — Assessment & Plan Note (Addendum)
4 days of frontal headache. No red flags. Suspect cervicogenic headache vs tension headache. As patient has not tried anything for relief, advised Ibuprofen 600mg  q8h and heating pad use. Also encouraged adequate sleep. Return precautions reviewed.

## 2023-03-23 ENCOUNTER — Ambulatory Visit: Payer: Medicaid Other | Admitting: Family Medicine

## 2023-04-13 ENCOUNTER — Telehealth: Payer: Self-pay

## 2023-04-13 NOTE — Telephone Encounter (Signed)
Patient calls nurse line regarding abnormal vaginal bleeding. She reports that she has been having intermittent vaginal bleeding for the last two months.   Over the last several days, she has noticed larger gushes and blood clots. Reports clots to be about the size of a quarter. She reports increased fatigue and intermittent lightheadedness. She states that this improves with rest.   Scheduled patient for next available appointment on Monday afternoon.   Discussed strict ED precautions. Patient verbalizes understanding.   Veronda Prude, RN

## 2023-04-16 ENCOUNTER — Ambulatory Visit (INDEPENDENT_AMBULATORY_CARE_PROVIDER_SITE_OTHER): Payer: Medicaid Other | Admitting: Student

## 2023-04-16 VITALS — BP 122/72 | HR 67 | Ht 68.0 in | Wt 136.2 lb

## 2023-04-16 DIAGNOSIS — N926 Irregular menstruation, unspecified: Secondary | ICD-10-CM

## 2023-04-16 LAB — POCT URINE PREGNANCY: Preg Test, Ur: NEGATIVE

## 2023-04-16 NOTE — Assessment & Plan Note (Signed)
Primary suspect post Depo-Provera effect versus dysfunctional uterine bleeding. UPT negative in office today. TSH in 02/2023 WNL. Patient declining OCPs at this time.  Will obtain transvaginal ultrasound to rule out structural causes such as endometrial polyps, submucosal fibroids or adenomyosis.  Consider prolactin levels/coagulopathies if bleeding fails to improve and no cause identified.

## 2023-04-16 NOTE — Patient Instructions (Addendum)
It was great to see you! Thank you for allowing me to participate in your care!   I recommend that you always bring your medications to each appointment as this makes it easy to ensure we are on the correct medications and helps [place] not miss when refills are needed.  Our plans for today:  -Please go to your ultrasound appointment scheduled on April 18, 2023 at 4 pm. Your ultrasound is located at [facility], please check in at the front.  I recommend arriving at least 15 to 20 minutes prior to appointment time. Please go to your appointment with a full bladder.  We are checking some labs today, I will call you if they are abnormal will send you a MyChart message or a letter if they are normal.  If you do not hear about your labs in the next 2 weeks please let us know.  Take care and seek immediate care sooner if you develop any concerns. Please remember to show up 15 minutes before your scheduled appointment time!  Tiffany Kocher, DO [facility]

## 2023-04-16 NOTE — Progress Notes (Signed)
    SUBJECTIVE:   CHIEF COMPLAINT / HPI:   Irregular vaginal bleeding Underwent termination of pregnancy in November 2023, and then started on Depo-Provera and December 2023.  She experienced irregular bleeding after starting Depo-Provera, and last Depo injection was February 2024.  She was seen in March 2024 by our office, started on Sprintec-however did not have improvement with irregular bleeding.  She followed up in June 2024, and all hormonal contraceptives were discontinued-patient opted for nonhormonal methods.  Patient is still experiencing irregular bleeding, bleeding occurs daily.  She reports blood clots, and using multiple pads per day.  Additionally, she started to become more tired and thinks may related to blood loss.  No personal history of cancer, non-smoker.  Reports family history of ovarian cancer in eldest sister.  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 Cap Refill <2s. Respiratory: CTAB, normal wob on RA Skin: Warm and dry  ASSESSMENT/PLAN:   Irregular bleeding Assessment & Plan: Primary suspect post Depo-Provera effect versus dysfunctional uterine bleeding. UPT negative in office today. TSH in 02/2023 WNL. Patient declining OCPs at this time.  Will obtain transvaginal ultrasound to rule out structural causes such as endometrial polyps, submucosal fibroids or adenomyosis.  Consider prolactin levels/coagulopathies if bleeding fails to improve and no cause identified.  Orders: -     POCT urine pregnancy -     CBC -     US PELVIC COMPLETE WITH TRANSVAGINAL; Future   Tiffany Kocher, DO [facility]

## 2023-04-18 ENCOUNTER — Other Ambulatory Visit: Payer: Self-pay | Admitting: Family Medicine

## 2023-04-18 ENCOUNTER — Ambulatory Visit (HOSPITAL_COMMUNITY)
Admission: RE | Admit: 2023-04-18 | Discharge: 2023-04-18 | Disposition: A | Discharge status: Home / Self Care | Payer: Medicaid Other | Source: Ambulatory Visit | Attending: Family Medicine | Admitting: Family Medicine

## 2023-04-18 DIAGNOSIS — N926 Irregular menstruation, unspecified: Secondary | ICD-10-CM | POA: Insufficient documentation

## 2023-04-18 DIAGNOSIS — D259 Leiomyoma of uterus, unspecified: Secondary | ICD-10-CM | POA: Diagnosis not present

## 2023-04-23 ENCOUNTER — Telehealth: Payer: Self-pay | Admitting: Student

## 2023-04-23 NOTE — Telephone Encounter (Signed)
-----   Message from Lincoln Brigham sent at 04/23/2023 10:35 AM EDT -----  ----- Message ----- From: Carney Living, MD Sent: 04/19/2023  10:54 AM EDT To: Lincoln Brigham, MD   ----- Message ----- From: Interface, Rad Results In Sent: 04/19/2023   8:06 AM EDT To: Carney Living, MD

## 2023-04-23 NOTE — Telephone Encounter (Signed)
Called patient x 2, went straight to voicemail.  Left HIPAA compliant voicemail.  Will make MyChart comment on most recent ultrasound for patient.  Uterine fibroid present in the posterior fundal myometrium measuring 4.6 x 3.8 x 4.5 centimeter.  It is a possible etiology to her irregular bleeding.  I recommend she restart OCPs,  OTC NSAIDs, and continue to take oral iron supplementation. If she fails treatment or does not want medication, would refer to GYN to discuss further options.

## 2023-04-23 NOTE — Telephone Encounter (Signed)
Spoke with patient on phone, confirmed identity.  Discussed ultrasound findings.  Discussed medical treatment options.  Plan for office visit to discuss further.

## 2023-05-01 ENCOUNTER — Encounter: Payer: Self-pay | Admitting: Student

## 2023-05-01 ENCOUNTER — Other Ambulatory Visit: Payer: Self-pay

## 2023-05-01 ENCOUNTER — Other Ambulatory Visit: Payer: Self-pay | Admitting: Student

## 2023-05-01 ENCOUNTER — Ambulatory Visit (INDEPENDENT_AMBULATORY_CARE_PROVIDER_SITE_OTHER): Payer: Medicaid Other | Admitting: Student

## 2023-05-01 VITALS — BP 114/75 | HR 59 | Ht 68.0 in | Wt 135.4 lb

## 2023-05-01 DIAGNOSIS — N926 Irregular menstruation, unspecified: Secondary | ICD-10-CM | POA: Diagnosis present

## 2023-05-01 LAB — POCT HEMOGLOBIN: Hemoglobin: 10.6 g/dL — AB (ref 11–14.6)

## 2023-05-01 MED ORDER — MEDROXYPROGESTERONE ACETATE 10 MG PO TABS: 10.0000 mg | ORAL_TABLET | Freq: Three times a day (TID) | ORAL | 0 refills | Status: DC

## 2023-05-01 NOTE — Assessment & Plan Note (Addendum)
Unclear cause of AUB however suspect this is likely due to fibroid which was recently visualized on her TVUS.  Discussed findings with patient's.  Pregnancy test from 2 weeks ago was negative.  Given reports of increased bleeding in the last few days repeated a point-of-care hemoglobin which was stable from 2 weeks ago.  Informed patient of treatment options available and given she is of childbearing age and expressed tension of having more babies in the future discussed need to see OB/GYN.  Meantime we will start patient on trial Provera.  Plan discussed with patient who verbalized understanding and agreeable to plan. -Placed referral to OB/GYN -Rx Provera 10 mg 3 times daily -Encourage patient to follow-up at her next appointment in a week to reassess bleeding until OB/GYN appointment is set.

## 2023-05-01 NOTE — Patient Instructions (Addendum)
It was wonderful to meet you today. Thank you for allowing me to be a part of your care. Below is a short summary of what we discussed at your visit today:  Your transvaginal ultrasound showed that you have a fibroid.  This could be the cause of your bleeding.  I am sending in prescription for Provera which you will take 10 mg 3 times daily.  Also sending a referral to be seen by his [facility] gynecology specialist.  Manage a fibroid and abnormal bleeding.  Your hemoglobin today was 10.6 no significant change from your last test.  Please bring all of your medications to every appointment!  If you have any questions or concerns, please do not hesitate to contact us via phone or MyChart message.   Jerre Simon, MD [facility]

## 2023-05-01 NOTE — Progress Notes (Cosign Needed Addendum)
    SUBJECTIVE:   CHIEF COMPLAINT / HPI:   Patient is a 32 year old female presenting today for abnormal uterine bleeding. She was previously on Depo but continued to have AUB. Eventually switched to Sprintec's and patient reports no improvement with abnormal uterine bleeding. TVUS obtained showed fibroid in the posterior fundal myomectomy patient has continued to have abnormal uterine bleeding.   Reports in the last few days has had increased bleeding. She endorses some fatigue but last hemoglobin was 10.6.   PERTINENT  PMH / PSH: Reviewed  OBJECTIVE:   BP 114/75   Pulse (!) 59   Ht 5\' 8"  (1.727 m)   Wt 135 lb 6.4 oz (61.4 kg)   LMP  (LMP Unknown)   SpO2 98%   BMI 20.59 kg/m    Physical Exam General: Alert, well appearing, NAD Cardiovascular: RRR, No Murmurs, Normal S2/S2 Respiratory: CTAB, No wheezing or Rales Abdomen: No distension or tenderness   ASSESSMENT/PLAN:   Irregular bleeding Unclear cause of AUB however suspect this is likely due to fibroid which was recently visualized on her TVUS.  Discussed findings with patient's.  Pregnancy test from 2 weeks ago was negative.  Given reports of increased bleeding in the last few days repeated a point-of-care hemoglobin which was stable from 2 weeks ago.  Informed patient of treatment options available and given she is of childbearing age and expressed tension of having more babies in the future discussed need to see OB/GYN.  Meantime we will start patient on trial Provera.  Plan discussed with patient who verbalized understanding and agreeable to plan. -Placed referral to OB/GYN -Rx Provera 10 mg 3 times daily -Encourage patient to follow-up at her next appointment in a week to reassess bleeding until OB/GYN appointment is set.     Jerre Simon, MD [facility]

## 2023-05-02 NOTE — Telephone Encounter (Signed)
Notified by pharmacy that insurance would only cover BID dosing of Provera. Switched prescription from TID to BID.

## 2023-05-07 NOTE — Progress Notes (Deleted)
    SUBJECTIVE:   CHIEF COMPLAINT / HPI:   *** - able to schedule w/ OBGYN?  PERTINENT  PMH / PSH: ***  OBJECTIVE:   LMP  (LMP Unknown)   ***  ASSESSMENT/PLAN:   No problem-specific Assessment & Plan notes found for this encounter.     Lincoln Brigham, MD [facility]

## 2023-05-08 ENCOUNTER — Ambulatory Visit: Payer: Medicaid Other | Admitting: Family Medicine

## 2023-05-08 ENCOUNTER — Ambulatory Visit (INDEPENDENT_AMBULATORY_CARE_PROVIDER_SITE_OTHER): Payer: Medicaid Other | Admitting: Family Medicine

## 2023-05-08 VITALS — BP 120/80 | HR 74 | Ht 68.0 in | Wt 131.0 lb

## 2023-05-08 DIAGNOSIS — N939 Abnormal uterine and vaginal bleeding, unspecified: Secondary | ICD-10-CM | POA: Diagnosis not present

## 2023-05-08 DIAGNOSIS — G43909 Migraine, unspecified, not intractable, without status migrainosus: Secondary | ICD-10-CM | POA: Insufficient documentation

## 2023-05-08 MED ORDER — KETOROLAC TROMETHAMINE 30 MG/ML IJ SOLN: 30.0000 mg | Freq: Once | INTRAMUSCULAR | Status: DC

## 2023-05-08 MED ORDER — KETOROLAC TROMETHAMINE 30 MG/ML IJ SOLN
15.0000 mg | Freq: Once | INTRAMUSCULAR | Status: AC
  Administered 2023-05-08: 15 mg via INTRAMUSCULAR

## 2023-05-08 MED ORDER — ONDANSETRON 4 MG PO TBDP: 4.0000 mg | ORAL_TABLET | Freq: Three times a day (TID) | ORAL | 0 refills | Status: AC | PRN

## 2023-05-08 MED ORDER — NAPROXEN 500 MG PO TABS: 500.0000 mg | ORAL_TABLET | Freq: Two times a day (BID) | ORAL | 0 refills | Status: DC
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
## 2023-05-08 NOTE — Assessment & Plan Note (Signed)
Most likely secondary to fibroids found on TVUS. Not currently bleeding, did not take Provera prescribed at prior visit. OB/GYN referral pending -Recommend patient take Provera as prescribed at last visit if bleeding occurs
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
## 2023-05-08 NOTE — Patient Instructions (Addendum)
It was wonderful to see you today! Thank you for choosing [facility].   Please bring ALL of your medications with you to every visit.   Today we talked about:  For your fibroids, please take the Provera (already at the pharmacy) when you start bleeding again. The referral to OBGYN is processing but would be the next step in treating your symptoms. For your headache we are giving you a shot to help with the pain today. I am also prescribing a strong anti-inflammatory called Naproxen to help with the pain. You can take it starting tomorrow, twice per day for 2 weeks. You can also use the Zofran (dissolving) as needed every 8 hours for nausea relief. I would recommend cognitive rest until your migraine improves. This is reducing the amount of light exposure, screen usage and limiting complex thinking.  Please follow up in 2 weeks  Call the clinic at [phone] if your symptoms worsen or you have any concerns.  Please be sure to schedule follow up at the front desk before you leave today.   Elberta Fortis, DO Family Medicine
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
## 2023-05-29 ENCOUNTER — Other Ambulatory Visit (HOSPITAL_COMMUNITY)
Admission: RE | Admit: 2023-05-29 | Discharge: 2023-05-29 | Disposition: A | Discharge status: Home / Self Care | Payer: Medicaid Other | Source: Ambulatory Visit | Attending: Family Medicine | Admitting: Family Medicine

## 2023-05-29 ENCOUNTER — Encounter: Payer: Self-pay | Admitting: Family Medicine

## 2023-05-29 ENCOUNTER — Ambulatory Visit: Payer: Medicaid Other | Admitting: Family Medicine

## 2023-05-29 VITALS — BP 124/67 | HR 65 | Ht 68.0 in | Wt 141.2 lb

## 2023-05-29 DIAGNOSIS — G43909 Migraine, unspecified, not intractable, without status migrainosus: Secondary | ICD-10-CM

## 2023-05-29 DIAGNOSIS — N898 Other specified noninflammatory disorders of vagina: Secondary | ICD-10-CM | POA: Insufficient documentation

## 2023-05-29 DIAGNOSIS — N760 Acute vaginitis: Secondary | ICD-10-CM

## 2023-05-29 DIAGNOSIS — B9689 Other specified bacterial agents as the cause of diseases classified elsewhere: Secondary | ICD-10-CM

## 2023-05-29 NOTE — Progress Notes (Signed)
Patient messaged after clinic appointment that she had a home positive pregnancy test.  Spoke with patient over the phone about positive pregnancy test, unknown last menstrual period which she last had bleeding on 8/30.  Still unsure if she wants to keep the pregnancy, states she does not feel ready to be pregnant. Advised patient to pick up prenatal vitamin and start taking today.  Recommend she stop using any other substances or alcohol. Patient scheduled for 10/4 at 3:10 PM with PCP for follow-up, advised her to call if she decides she does not want to proceed with the pregnancy before that time.  Elberta Fortis, DO
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
## 2023-05-29 NOTE — Patient Instructions (Signed)
It was wonderful to see you today! Thank you for choosing [facility].   Please bring ALL of your medications with you to every visit.   Today we talked about:  We are swabbing you for BV, yeast and STIs today. I will follow up with you about those results. If you have additional concern about work accommodating your headaches please request FMLA paperwork from them.  Please follow up as needed for persistent symptoms.   We are checking some labs today. If they are abnormal, I will call you. If they are normal, I will send you a MyChart message (if it is active) or a letter in the mail. If you do not hear about your labs in the next 2 weeks, please call the office.  Call the clinic at [phone] if your symptoms worsen or you have any concerns.  Please be sure to schedule follow up at the front desk before you leave today.   Elberta Fortis, DO Family Medicine

## 2023-05-30 LAB — CERVICOVAGINAL ANCILLARY ONLY
Bacterial Vaginitis (gardnerella): POSITIVE — AB
Candida Glabrata: NEGATIVE
Candida Vaginitis: NEGATIVE
Chlamydia: NEGATIVE
Comment: NEGATIVE
Comment: NEGATIVE
Comment: NEGATIVE
Comment: NEGATIVE
Comment: NEGATIVE
Comment: NORMAL
Neisseria Gonorrhea: NEGATIVE
Trichomonas: NEGATIVE

## 2023-06-01 MED ORDER — METRONIDAZOLE 500 MG PO TABS: 500.0000 mg | ORAL_TABLET | Freq: Two times a day (BID) | ORAL | 0 refills | Status: AC

## 2023-06-01 NOTE — Addendum Note (Signed)
Addended by: Elberta Fortis on: 06/01/2023 04:18 PM   Modules accepted: Orders

## 2023-06-03 ENCOUNTER — Inpatient Hospital Stay (HOSPITAL_COMMUNITY)
Admission: AD | Admit: 2023-06-03 | Discharge: 2023-06-04 | Disposition: A | Discharge status: Home / Self Care | Payer: Medicaid Other | Attending: Obstetrics & Gynecology | Admitting: Obstetrics & Gynecology

## 2023-06-03 DIAGNOSIS — O3680X0 Pregnancy with inconclusive fetal viability, not applicable or unspecified: Secondary | ICD-10-CM | POA: Insufficient documentation

## 2023-06-03 DIAGNOSIS — R109 Unspecified abdominal pain: Secondary | ICD-10-CM | POA: Insufficient documentation

## 2023-06-03 DIAGNOSIS — O219 Vomiting of pregnancy, unspecified: Secondary | ICD-10-CM | POA: Insufficient documentation

## 2023-06-03 DIAGNOSIS — D259 Leiomyoma of uterus, unspecified: Secondary | ICD-10-CM | POA: Insufficient documentation

## 2023-06-03 DIAGNOSIS — Z3201 Encounter for pregnancy test, result positive: Secondary | ICD-10-CM | POA: Insufficient documentation

## 2023-06-03 DIAGNOSIS — O3411 Maternal care for benign tumor of corpus uteri, first trimester: Secondary | ICD-10-CM | POA: Insufficient documentation

## 2023-06-03 DIAGNOSIS — O26891 Other specified pregnancy related conditions, first trimester: Secondary | ICD-10-CM | POA: Insufficient documentation

## 2023-06-03 DIAGNOSIS — O99011 Anemia complicating pregnancy, first trimester: Secondary | ICD-10-CM | POA: Insufficient documentation

## 2023-06-03 DIAGNOSIS — Z3A01 Less than 8 weeks gestation of pregnancy: Secondary | ICD-10-CM | POA: Insufficient documentation

## 2023-06-04 ENCOUNTER — Encounter (HOSPITAL_COMMUNITY): Payer: Self-pay | Admitting: Obstetrics & Gynecology

## 2023-06-04 ENCOUNTER — Inpatient Hospital Stay (HOSPITAL_COMMUNITY): Payer: Medicaid Other

## 2023-06-04 DIAGNOSIS — Z3A01 Less than 8 weeks gestation of pregnancy: Secondary | ICD-10-CM | POA: Diagnosis not present

## 2023-06-04 DIAGNOSIS — O26899 Other specified pregnancy related conditions, unspecified trimester: Secondary | ICD-10-CM | POA: Diagnosis not present

## 2023-06-04 DIAGNOSIS — O3480 Maternal care for other abnormalities of pelvic organs, unspecified trimester: Secondary | ICD-10-CM | POA: Diagnosis not present

## 2023-06-04 DIAGNOSIS — O219 Vomiting of pregnancy, unspecified: Secondary | ICD-10-CM | POA: Diagnosis not present

## 2023-06-04 DIAGNOSIS — Z3201 Encounter for pregnancy test, result positive: Secondary | ICD-10-CM | POA: Diagnosis not present

## 2023-06-04 DIAGNOSIS — D259 Leiomyoma of uterus, unspecified: Secondary | ICD-10-CM | POA: Diagnosis not present

## 2023-06-04 DIAGNOSIS — O3411 Maternal care for benign tumor of corpus uteri, first trimester: Secondary | ICD-10-CM | POA: Diagnosis not present

## 2023-06-04 DIAGNOSIS — R109 Unspecified abdominal pain: Secondary | ICD-10-CM | POA: Diagnosis present

## 2023-06-04 DIAGNOSIS — O3680X0 Pregnancy with inconclusive fetal viability, not applicable or unspecified: Secondary | ICD-10-CM

## 2023-06-04 DIAGNOSIS — O99011 Anemia complicating pregnancy, first trimester: Secondary | ICD-10-CM | POA: Diagnosis not present

## 2023-06-04 DIAGNOSIS — O26891 Other specified pregnancy related conditions, first trimester: Secondary | ICD-10-CM | POA: Diagnosis not present

## 2023-06-04 LAB — HCG, QUANTITATIVE, PREGNANCY: hCG, Beta Chain, Quant, S: 3228 m[IU]/mL — ABNORMAL HIGH (ref <5)

## 2023-06-04 LAB — CBC
HCT: 29.8 % — ABNORMAL LOW (ref 36.0–46.0)
Hemoglobin: 9.9 g/dL — ABNORMAL LOW (ref 12.0–15.0)
MCH: 29 pg (ref 26.0–34.0)
MCHC: 33.2 g/dL (ref 30.0–36.0)
MCV: 87.4 fL (ref 80.0–100.0)
Platelets: 321 10*3/uL (ref 150–400)
RBC: 3.41 MIL/uL — ABNORMAL LOW (ref 3.87–5.11)
RDW: 12.2 % (ref 11.5–15.5)
WBC: 6 10*3/uL (ref 4.0–10.5)
nRBC: 0 % (ref 0.0–0.2)

## 2023-06-04 LAB — URINALYSIS, ROUTINE W REFLEX MICROSCOPIC
Bilirubin Urine: NEGATIVE
Glucose, UA: NEGATIVE mg/dL
Hgb urine dipstick: NEGATIVE
Ketones, ur: 5 mg/dL — AB
Leukocytes,Ua: NEGATIVE
Nitrite: NEGATIVE
Protein, ur: NEGATIVE mg/dL
Specific Gravity, Urine: 1.019 (ref 1.005–1.030)
pH: 6 (ref 5.0–8.0)

## 2023-06-04 LAB — WET PREP, GENITAL
Sperm: NONE SEEN
Trich, Wet Prep: NONE SEEN
WBC, Wet Prep HPF POC: <10 (ref <10)
Yeast Wet Prep HPF POC: NONE SEEN

## 2023-06-04 LAB — POCT PREGNANCY, URINE: Preg Test, Ur: POSITIVE — AB

## 2023-06-04 LAB — HIV ANTIBODY (ROUTINE TESTING W REFLEX): HIV Screen 4th Generation wRfx: NONREACTIVE

## 2023-06-04 NOTE — MAU Provider Note (Signed)
History     CSN: [id number]  Arrival date and time: 06/03/23 2350   Provider at bedside at 0245    Chief Complaint  Patient presents with   Abdominal Pain   HPI [name] is a 32 y.o. year old [id number] female at [redacted]w[redacted]d weeks gestation by unsure LMP of 04/29/2023 who presents to MAU reporting positive home pregnancy test on Tuesday, May 28, 2023.  She reports lower abdominal cramping that started on June 02, 2023 that is now worse.  She also reports hiccuping and vomiting.  She denies any vaginal bleeding.  She does have a history of 1 abortion July 25, 2022 at 8 to 9 weeks; she took medicine.  She does plan to keep this pregnancy.  Her pregnancy is complicated by uterine fibroids, Anemia and morning sickness. She was told to stop taking all of her medications until she is seen at her NOB appt. she plans to start prenatal care at the [facility] her first appointment is on June 08, 2023. Her sister is present and contributing to the history taking.   OB History     Gravida  3   Para  0   Term  0   Preterm  0   AB  0   Living  0      SAB  0   IAB  0   Ectopic  0   Multiple  0   Live Births              Past Medical History:  Diagnosis Date   ADHD (attention deficit hyperactivity disorder)    Boils    Complication of anesthesia    Hypersensitive to anesthesia (wisdom teeth)   Headache    Hyperemesis gravidarum    Vaginal Pap smear, abnormal     Past Surgical History:  Procedure Laterality Date   OTHER SURGICAL HISTORY     elective abortion   WISDOM TOOTH EXTRACTION      Family History  Problem Relation Age of Onset   Depression Mother    Diabetes Mother    Hypertension Mother    Miscarriages / [place] Mother    Asthma Sister    Depression Sister    Hearing loss Sister    Birth defects Sister    Hyperlipidemia Sister    Hypertension Sister    Miscarriages / Stillbirths Sister    Asthma Maternal  Grandmother    Cancer Maternal Grandmother    Diabetes Maternal Grandmother    Hypertension Maternal Grandmother    Stroke Maternal Grandmother    Vision loss Maternal Grandmother    Cancer Paternal Grandmother     Social History   Tobacco Use   Smoking status: Never   Smokeless tobacco: Never  Vaping Use   Vaping status: Never Used  Substance Use Topics   Alcohol use: No   Drug use: No    Allergies: No Known Allergies  Medications Prior to Admission  Medication Sig Dispense Refill Last Dose   medroxyPROGESTERone (PROVERA) 10 MG tablet Take 1 tablet (10 mg total) by mouth in the morning and at bedtime for 10 days. 20 tablet 0    metroNIDAZOLE (FLAGYL) 500 MG tablet Take 1 tablet (500 mg total) by mouth 2 (two) times daily for 7 days. 14 tablet 0    naproxen (NAPROSYN) 500 MG tablet Take 1 tablet (500 mg total) by mouth 2 (two) times daily with a meal. 30 tablet 0  Review of Systems  Constitutional: Negative.   HENT: Negative.    Eyes: Negative.   Respiratory: Negative.    Cardiovascular: Negative.   Gastrointestinal:  Positive for nausea.  Endocrine: Negative.   Genitourinary:  Positive for pelvic pain.  Musculoskeletal: Negative.   Skin: Negative.   Allergic/Immunologic: Negative.   Neurological: Negative.   Hematological: Negative.   Psychiatric/Behavioral: Negative.     Physical Exam   Blood pressure 125/75, pulse 81, temperature 98.1 F (36.7 C), temperature source Oral, resp. rate 16, height 5\' 8"  (1.727 m), weight 62.1 kg, last menstrual period 04/29/2023, unknown if currently breastfeeding.  Physical Exam Constitutional:      Appearance: Normal appearance. She is normal weight.  Cardiovascular:     Rate and Rhythm: Normal rate.  Pulmonary:     Effort: Pulmonary effort is normal.  Genitourinary:    Comments: Swabs collected by patient using blind swab technique  Musculoskeletal:        General: Normal range of motion.  Skin:    General: Skin is  warm and dry.  Neurological:     Mental Status: She is alert and oriented to person, place, and time.  Psychiatric:        Mood and Affect: Mood normal.        Behavior: Behavior normal.        Thought Content: Thought content normal.        Judgment: Judgment normal.     MAU Course  Procedures  MDM CCUA UPT CBC ABO/Rh HCG OB U/S < 14 wks TVUS  Results for orders placed or performed during the hospital encounter of 06/03/23 (from the past 24 hour(s))  Wet prep, genital     Status: Abnormal   Collection Time: 06/04/23 12:20 AM  Result Value Ref Range   Yeast Wet Prep HPF POC NONE SEEN NONE SEEN   Trich, Wet Prep NONE SEEN NONE SEEN   Clue Cells Wet Prep HPF POC PRESENT (A) NONE SEEN   WBC, Wet Prep HPF POC <10 <10   Sperm NONE SEEN   Pregnancy, urine POC     Status: Abnormal   Collection Time: 06/04/23 12:22 AM  Result Value Ref Range   Preg Test, Ur POSITIVE (A) NEGATIVE  Urinalysis, Routine w reflex microscopic -Urine, Clean Catch     Status: Abnormal   Collection Time: 06/04/23 12:28 AM  Result Value Ref Range   Color, Urine YELLOW YELLOW   APPearance HAZY (A) CLEAR   Specific Gravity, Urine 1.019 1.005 - 1.030   pH 6.0 5.0 - 8.0   Glucose, UA NEGATIVE NEGATIVE mg/dL   Hgb urine dipstick NEGATIVE NEGATIVE   Bilirubin Urine NEGATIVE NEGATIVE   Ketones, ur 5 (A) NEGATIVE mg/dL   Protein, ur NEGATIVE NEGATIVE mg/dL   Nitrite NEGATIVE NEGATIVE   Leukocytes,Ua NEGATIVE NEGATIVE  CBC     Status: Abnormal   Collection Time: 06/04/23 12:45 AM  Result Value Ref Range   WBC 6.0 4.0 - 10.5 K/uL   RBC 3.41 (L) 3.87 - 5.11 MIL/uL   Hemoglobin 9.9 (L) 12.0 - 15.0 g/dL   HCT 16.1 (L) 09.6 - 04.5 %   MCV 87.4 80.0 - 100.0 fL   MCH 29.0 26.0 - 34.0 pg   MCHC 33.2 30.0 - 36.0 g/dL   RDW 40.9 81.1 - 91.4 %   Platelets 321 150 - 400 K/uL   nRBC 0.0 0.0 - 0.2 %  hCG, quantitative, pregnancy     Status: Abnormal  Collection Time: 06/04/23 12:45 AM  Result Value Ref Range    hCG, Beta Chain, Quant, S 3,228 (H) <5 mIU/mL  HIV Antibody (routine testing w rflx)     Status: None   Collection Time: 06/04/23 12:45 AM  Result Value Ref Range   HIV Screen 4th Generation wRfx Non Reactive Non Reactive    US OB LESS THAN 14 WEEKS WITH OB TRANSVAGINAL  Result Date: 06/04/2023 CLINICAL DATA:  Abdominal pain. Positive pregnancy test. History of fibroids. Quantitative beta HCG is 3,228. Unknown LMP. EXAM: OBSTETRIC <14 WK [place] AND TRANSVAGINAL OB US TECHNIQUE: Both transabdominal and transvaginal ultrasound examinations were performed for complete evaluation of the gestation as well as the maternal uterus, adnexal regions, and pelvic cul-de-sac. Transvaginal technique was performed to assess early pregnancy. COMPARISON:  Ultrasound pelvis 04/18/2023 FINDINGS: Intrauterine gestational sac: A possible early intrauterine gestational sac is identified intermittently in the examination. Yolk sac:  Not Visualized. Embryo:  Not Visualized. Cardiac Activity: Not Visualized. MSD: Unable to measure. Subchorionic hemorrhage:  None visualized. Maternal uterus/adnexae: Uterus is anteverted. A large uterine fibroid is present measuring 5.1 x 4 x 5.1 cm. Both ovaries are visualized and appear normal. Corpus luteal cyst noted on the left. Small amount of free fluid in the pelvis. IMPRESSION: 1. Although a possible early intrauterine sac is intermittently identified in the examination, no definite intrauterine gestational sac, yolk sac, or fetal pole identified. Differential considerations include intrauterine pregnancy too early to be sonographically visualized, missed abortion, or ectopic pregnancy. Followup ultrasound is recommended in 10-14 days for further evaluation. 2. Normal ultrasound appearance of the ovaries. 3. Large uterine fibroid. Electronically Signed   By: Burman Nieves M.D.   On: 06/04/2023 02:16      Assessment and Plan  1. Pregnancy of unknown anatomic location - Information  provided on PUL and ectopic pregnancy   2. Abdominal pain during pregnancy in first trimester - Information provided on abdominal pain in pregnancy   3. [redacted] weeks gestation of pregnancy - Advised to restart taking iron supplements - Offer Rx for antiemetic. She states she was already Rx'd a medication that "melts under the tongue."  - Discharge patient - Keep scheduled appt with [facility] on 06/08/2023 - Patient verbalized an understanding of the plan of care and agrees.  Raelyn Mora, CNM 06/04/2023, 2:55 AM

## 2023-06-04 NOTE — MAU Note (Signed)
Pt she thinks LMP was 04-29-2023 But from fibroids  HPT done on Tuesday - positive  Has appointment on  Friday 10-4- [facility] - [address] [place] abd cramps- started Sat - worse now. No VB  Started vomiting. Hiccups in Triage

## 2023-06-04 NOTE — Discharge Instructions (Signed)
Return to MAU: If you have heavier bleeding that soaks through more that 2 pads per hour for an hour or more If you bleed so much that you feel like you might pass out or you do pass out If you have significant abdominal pain that is not improved with Tylenol 1000 mg every 8 hours as needed for pain If you develop a fever > 100.5  

## 2023-06-05 LAB — GC/CHLAMYDIA PROBE AMP (~~LOC~~) NOT AT ARMC
Chlamydia: NEGATIVE
Comment: NEGATIVE
Comment: NORMAL
Neisseria Gonorrhea: NEGATIVE

## 2023-06-07 NOTE — Progress Notes (Unsigned)
    SUBJECTIVE:   CHIEF COMPLAINT / HPI:   ***  - positive pregnancy test  - Keep pregnancy? - Repeat US in ~10 days  PERTINENT  PMH / PSH: ***  OBJECTIVE:   LMP 04/29/2023   ***  ASSESSMENT/PLAN:   No problem-specific Assessment & Plan notes found for this encounter.     Lincoln Brigham, MD [facility]

## 2023-06-08 ENCOUNTER — Ambulatory Visit (INDEPENDENT_AMBULATORY_CARE_PROVIDER_SITE_OTHER): Payer: Medicaid Other | Admitting: Family Medicine

## 2023-06-08 VITALS — Ht 68.0 in | Wt 134.0 lb

## 2023-06-08 DIAGNOSIS — Z3200 Encounter for pregnancy test, result unknown: Secondary | ICD-10-CM | POA: Diagnosis not present

## 2023-06-08 DIAGNOSIS — R1031 Right lower quadrant pain: Secondary | ICD-10-CM | POA: Diagnosis not present

## 2023-06-08 DIAGNOSIS — Z3490 Encounter for supervision of normal pregnancy, unspecified, unspecified trimester: Secondary | ICD-10-CM | POA: Diagnosis present

## 2023-06-08 LAB — POCT URINE PREGNANCY: Preg Test, Ur: POSITIVE — AB

## 2023-06-08 NOTE — Progress Notes (Unsigned)
    SUBJECTIVE:   CHIEF COMPLAINT / HPI:   Sharon Barnett is a 32yo F w/ hx of migraines, fibroid that p/f pregnancy follow-up.    - Pt has tested positive for pregnancy. Went to MAU 9/29 for RLQ abm pain, [place] at that time could not confirm location of gestational sac. There is potential c/f ectopic pregnancy. - Having occasional cramps in RLQ. This has been happening for past 1-2 weeks. - Pt reports she is still having RLQ pain, about same as prior. - No vaginal bleeding at this time - Stopped bleeding on the August 30th (unclear if this was a true menstrual period). No bleeding since August 30th. Had intercourse on August 31st.  - She is unsure if she wants to keep pregnancy. She is also interested in potentially pursuing abortion care.  OBJECTIVE:   Ht 5\' 8"  (1.727 m)   Wt 134 lb (60.8 kg)   LMP 04/29/2023   BMI 20.37 kg/m   General: Alert, pleasant woman. NAD. HEENT: NCAT. MMM. CV: RRR, no murmurs. Resp: CTAB, no wheezing or crackles. Normal WOB on RA.  Abm: TTP in RLQ. No rebound, rigidity, or guarding. Soft, nondistended. BS present. Ext: Moves all ext spontaneously Skin: Warm, well perfused    ASSESSMENT/PLAN:   Pregnant Confirmed pregnancy positive. Thinks LMP was 8/25-8/30. Unsure if she wants to keep this pregnancy. Advised to start taking daily prenatal vitamin and tentatively schedule an initial OB visit within the next month. Also provided abortion care resources, and advised to cancel initial OB visit if she chooses to pursue abortion care.   RLQ abdominal pain 1-2 wk hx of RLQ cramping pain. C/f ectopic pregnancy. [place] at [facility] 9/29 did not confirm location of gestational sac. bHCG today had appropriate fourfold increase over past 4 days, which is more reassuring for intrauterine pregancy. Pt has repeat US 10/14. - Strict ED/MAU precautions discussed - bHCG today appropriate - repeat US 10/14   Lincoln Brigham, MD [facility]

## 2023-06-08 NOTE — Patient Instructions (Signed)
Good to see you today - Thank you for coming in  Things we discussed today:  1) Because of your abdominal cramping, there is a risk that your pregnancy might have implanted in your fallopian tubes, which is a life-threatening condition called ectopic pregnancy. - We will check your labs today to see if your beta HCG is rising appropriately - We will also schedule for you to get a repeat Ultrasound to make sure where the pregnancy is growing   - Let's schedule an initial OB visit to help you establish care. Start taking a daily prenatal vitamin.  - At the same time, I am providing you a list of abortion care resources in case you want to pursue that instead. If we decide to do that, then you can just cancel the OB appointment.   Please go to the MAU for emergency medical attention if you: - Start having increased vaginal bleeding - You abdominal pain gets worse - You start vomiting uncontrollably  Please schedule an initial OB visit within the next month

## 2023-06-09 LAB — BETA HCG QUANT (REF LAB): hCG Quant: 11451 m[IU]/mL

## 2023-06-11 ENCOUNTER — Emergency Department (HOSPITAL_COMMUNITY)
Admission: EM | Admit: 2023-06-11 | Discharge: 2023-06-11 | Disposition: A | Discharge status: Home / Self Care | Payer: Medicaid Other | Attending: Emergency Medicine | Admitting: Emergency Medicine

## 2023-06-11 ENCOUNTER — Emergency Department (HOSPITAL_COMMUNITY): Payer: Medicaid Other

## 2023-06-11 ENCOUNTER — Other Ambulatory Visit: Payer: Self-pay

## 2023-06-11 DIAGNOSIS — O219 Vomiting of pregnancy, unspecified: Secondary | ICD-10-CM | POA: Insufficient documentation

## 2023-06-11 DIAGNOSIS — R1031 Right lower quadrant pain: Secondary | ICD-10-CM | POA: Diagnosis not present

## 2023-06-11 DIAGNOSIS — Z3A01 Less than 8 weeks gestation of pregnancy: Secondary | ICD-10-CM | POA: Insufficient documentation

## 2023-06-11 DIAGNOSIS — R110 Nausea: Secondary | ICD-10-CM | POA: Diagnosis not present

## 2023-06-11 DIAGNOSIS — Z3490 Encounter for supervision of normal pregnancy, unspecified, unspecified trimester: Secondary | ICD-10-CM | POA: Insufficient documentation

## 2023-06-11 DIAGNOSIS — R1111 Vomiting without nausea: Secondary | ICD-10-CM | POA: Diagnosis not present

## 2023-06-11 LAB — URINALYSIS, W/ REFLEX TO CULTURE (INFECTION SUSPECTED)
Bacteria, UA: NONE SEEN
Bilirubin Urine: NEGATIVE
Glucose, UA: NEGATIVE mg/dL
Hgb urine dipstick: NEGATIVE
Ketones, ur: 80 mg/dL — AB
Nitrite: NEGATIVE
Protein, ur: 100 mg/dL — AB
Specific Gravity, Urine: 1.031 — ABNORMAL HIGH (ref 1.005–1.030)
pH: 6 (ref 5.0–8.0)

## 2023-06-11 LAB — CBC WITH DIFFERENTIAL/PLATELET
Abs Immature Granulocytes: 0.02 10*3/uL (ref 0.00–0.07)
Basophils Absolute: 0 10*3/uL (ref 0.0–0.1)
Basophils Relative: 0 %
Eosinophils Absolute: 0 10*3/uL (ref 0.0–0.5)
Eosinophils Relative: 0 %
HCT: 32.4 % — ABNORMAL LOW (ref 36.0–46.0)
Hemoglobin: 10.5 g/dL — ABNORMAL LOW (ref 12.0–15.0)
Immature Granulocytes: 0 %
Lymphocytes Relative: 18 %
Lymphs Abs: 1.2 10*3/uL (ref 0.7–4.0)
MCH: 28.8 pg (ref 26.0–34.0)
MCHC: 32.4 g/dL (ref 30.0–36.0)
MCV: 88.8 fL (ref 80.0–100.0)
Monocytes Absolute: 0.2 10*3/uL (ref 0.1–1.0)
Monocytes Relative: 4 %
Neutro Abs: 5.4 10*3/uL (ref 1.7–7.7)
Neutrophils Relative %: 78 %
Platelets: 353 10*3/uL (ref 150–400)
RBC: 3.65 MIL/uL — ABNORMAL LOW (ref 3.87–5.11)
RDW: 12.4 % (ref 11.5–15.5)
WBC: 6.9 10*3/uL (ref 4.0–10.5)
nRBC: 0 % (ref 0.0–0.2)

## 2023-06-11 LAB — HCG, QUANTITATIVE, PREGNANCY: hCG, Beta Chain, Quant, S: 30084 m[IU]/mL — ABNORMAL HIGH (ref <5)

## 2023-06-11 LAB — COMPREHENSIVE METABOLIC PANEL
ALT: 12 U/L (ref 0–44)
AST: 13 U/L — ABNORMAL LOW (ref 15–41)
Albumin: 4.1 g/dL (ref 3.5–5.0)
Alkaline Phosphatase: 49 U/L (ref 38–126)
Anion gap: 12 (ref 5–15)
BUN: 14 mg/dL (ref 6–20)
CO2: 17 mmol/L — ABNORMAL LOW (ref 22–32)
Calcium: 8.8 mg/dL — ABNORMAL LOW (ref 8.9–10.3)
Chloride: 104 mmol/L (ref 98–111)
Creatinine, Ser: 0.68 mg/dL (ref 0.44–1.00)
GFR, Estimated: >60 mL/min (ref >60)
Glucose, Bld: 68 mg/dL — ABNORMAL LOW (ref 70–99)
Potassium: 3.3 mmol/L — ABNORMAL LOW (ref 3.5–5.1)
Sodium: 133 mmol/L — ABNORMAL LOW (ref 135–145)
Total Bilirubin: 0.9 mg/dL (ref 0.3–1.2)
Total Protein: 7.9 g/dL (ref 6.5–8.1)

## 2023-06-11 LAB — LIPASE, BLOOD: Lipase: 22 U/L (ref 11–51)

## 2023-06-11 MED ORDER — ONDANSETRON HCL 4 MG/2ML IJ SOLN
4.0000 mg | Freq: Once | INTRAMUSCULAR | Status: AC
  Administered 2023-06-11: 4 mg via INTRAVENOUS
  Filled 2023-06-11: qty 2

## 2023-06-11 MED ORDER — DOXYLAMINE-PYRIDOXINE 10-10 MG PO TBEC: 1.0000 | DELAYED_RELEASE_TABLET | Freq: Every day | ORAL | 0 refills | Status: DC

## 2023-06-11 MED ORDER — METOCLOPRAMIDE HCL 5 MG/ML IJ SOLN
10.0000 mg | Freq: Once | INTRAMUSCULAR | Status: AC
  Administered 2023-06-11: 10 mg via INTRAVENOUS
  Filled 2023-06-11: qty 2

## 2023-06-11 MED ORDER — LACTATED RINGERS IV BOLUS
1000.0000 mL | Freq: Once | INTRAVENOUS | Status: AC
  Administered 2023-06-11: 1000 mL via INTRAVENOUS

## 2023-06-11 MED ORDER — POTASSIUM CHLORIDE 10 MEQ/100ML IV SOLN
10.0000 meq | INTRAVENOUS | Status: AC
  Administered 2023-06-11 (×2): 10 meq via INTRAVENOUS
  Filled 2023-06-11 (×2): qty 100

## 2023-06-11 NOTE — Assessment & Plan Note (Addendum)
1-2 wk hx of RLQ cramping pain. C/f ectopic pregnancy. [place] at [facility] 9/29 did not confirm location of gestational sac. bHCG today had appropriate fourfold increase over past 4 days, which is more reassuring for intrauterine pregancy. Pt has repeat US 10/14. - Strict ED/MAU precautions discussed - bHCG today appropriate - repeat US 10/14

## 2023-06-11 NOTE — Assessment & Plan Note (Signed)
Confirmed pregnancy positive. Thinks LMP was 8/25-8/30. Unsure if she wants to keep this pregnancy. Advised to start taking daily prenatal vitamin and tentatively schedule an initial OB visit within the next month. Also provided abortion care resources, and advised to cancel initial OB visit if she chooses to pursue abortion care.

## 2023-06-11 NOTE — Discharge Instructions (Addendum)
Your blood work was overall reassuring.  You received IV fluids as well as medicines in the emergency department.  I spoke to the on-call OB regarding your ultrasound findings to recommend no intervention at this point.  They recommend that this is not a concerning finding and to keep your repeat ultrasound for 10/14.  Follow-up with your OB clinic.  Return for any concerning findings.  If you have any concerning symptoms go to [facility] emergency room into the MAU.  If you are unable to get there return to this hospital.  If you are unable to pick up the medication have sent into the pharmacy you can pick up the medications I have listed below from the drugstore and take them as directed.  Go to a drugstore and buy unisom and vitaminb6(pyridoxine) Take 1/2 a tab of unisom(12.5mg ) and 25mg  of vitamin b6.  Take this at night before bed.  If you continue to have nausea and vomiting take twice a day.  [facility]

## 2023-06-11 NOTE — ED Provider Notes (Signed)
[place] EMERGENCY DEPARTMENT AT [facility] Provider Note   CSN: [id number] Arrival date & time: 06/11/23  9562     History  No chief complaint on file.   Sharon Barnett is a 32 y.o. female.  32 year old female who is about [redacted] weeks gestation presents today for concern of right lower quadrant abdominal pain and persistent nausea and vomiting to the point she is unable to keep any food or drink down.  She states her symptoms have been ongoing now for few days.  She has had visits for similar complaints recently with the OB.  The history is provided by the patient and medical records. No language interpreter was used.       Home Medications Prior to Admission medications   Medication Sig Start Date End Date Taking? Authorizing Provider  zolpidem (AMBIEN CR) 6.25 MG CR tablet Take 1 tablet (6.25 mg total) by mouth at bedtime as needed for sleep. Patient not taking: Reported on 02/04/2019 07/11/16 05/20/19  Tillman Sers, DO      Allergies    Patient has no known allergies.    Review of Systems   Review of Systems  Constitutional:  Negative for chills and fever.  Respiratory:  Negative for shortness of breath.   Gastrointestinal:  Positive for abdominal pain, nausea and vomiting.  Neurological:  Negative for light-headedness.  All other systems reviewed and are negative.   Physical Exam Updated Vital Signs BP 118/79 (BP Location: Right Arm)   Pulse 66   Temp 98.5 F (36.9 C) (Oral)   Resp 16   Ht 5\' 8"  (1.727 m)   Wt 60.7 kg   LMP 04/29/2023   SpO2 100%   BMI 20.35 kg/m  Physical Exam Vitals and nursing note reviewed.  Constitutional:      General: She is not in acute distress.    Appearance: Normal appearance. She is not ill-appearing.  HENT:     Head: Normocephalic and atraumatic.     Nose: Nose normal.  Eyes:     Conjunctiva/sclera: Conjunctivae normal.  Cardiovascular:     Rate and Rhythm: Normal rate and regular rhythm.  Pulmonary:      Effort: Pulmonary effort is normal. No respiratory distress.  Abdominal:     General: There is no distension.     Palpations: Abdomen is soft.     Tenderness: There is abdominal tenderness. There is no guarding.  Musculoskeletal:        General: No deformity. Normal range of motion.     Cervical back: Normal range of motion.  Skin:    Findings: No rash.  Neurological:     Mental Status: She is alert.     ED Results / Procedures / Treatments   Labs (all labs ordered are listed, but only abnormal results are displayed) Labs Reviewed  CBC WITH DIFFERENTIAL/PLATELET - Abnormal; Notable for the following components:      Result Value   RBC 3.65 (*)    Hemoglobin 10.5 (*)    HCT 32.4 (*)    All other components within normal limits  COMPREHENSIVE METABOLIC PANEL - Abnormal; Notable for the following components:   Sodium 133 (*)    Potassium 3.3 (*)    CO2 17 (*)    Glucose, Bld 68 (*)    Calcium 8.8 (*)    AST 13 (*)    All other components within normal limits  HCG, QUANTITATIVE, PREGNANCY - Abnormal; Notable for the following components:  hCG, Beta Chain, Quant, S 30,084 (*)    All other components within normal limits  LIPASE, BLOOD  URINALYSIS, W/ REFLEX TO CULTURE (INFECTION SUSPECTED)    EKG None  Radiology US PELVIC COMPLETE WITH TRANSVAGINAL  Result Date: 06/11/2023 CLINICAL DATA:  Pelvic pain EXAM: OBSTETRIC <14 WK [place] AND TRANSVAGINAL OB TECHNIQUE: Both transabdominal and transvaginal ultrasound examinations were performed for complete evaluation of the gestation as well as the maternal uterus, adnexal regions, and pelvic cul-de-sac. Transvaginal technique was performed to assess early pregnancy. COMPARISON:  Pelvic ultrasound dated June 04, 2023. FINDINGS: Intrauterine gestational sac: Single Yolk sac:  Not Visualized. Embryo:  Not Visualized. Cardiac Activity: Not Visualized. MSD: 11.4 mm   5 w   6 d Subchorionic hemorrhage: A moderate-sized subchorionic  hemorrhage is noted measuring 3.3 x 1.8 x 1.1 cm. Maternal uterus/adnexae: The uterus is anteverted. A large uterine fibroid is again noted measuring 5.9 x 5.3 x 4.6 cm. Normal appearance of the ovaries with a corpus luteal cyst again noted on the left. There is a small amount of free fluid in the pelvis. IMPRESSION: 1. Moderate-sized subchorionic hemorrhage. 2. A single intrauterine gestational sac is identified measuring 5 weeks and 6 days by mean sac diameter. No yolk sac or embryo is visualized on today's examination. Follow-up ultrasound examination is recommended in 10-14 days for further evaluation. 3. Large uterine fibroid. 4. Normal sonographic appearance of the ovaries. Electronically Signed   By: Hart Robinsons M.D.   On: 06/11/2023 11:16    Procedures Procedures    Medications Ordered in ED Medications  metoCLOPramide (REGLAN) injection 10 mg (10 mg Intravenous Given 06/11/23 1610)    ED Course/ Medical Decision Making/ A&P                                 Medical Decision Making Amount and/or Complexity of Data Reviewed Labs: ordered. Radiology: ordered.  Risk Prescription drug management.   Medical Decision Making / ED Course   This patient presents to the ED for concern of abdominal pain, nausea, vomiting, this involves an extensive number of treatment options, and is a complaint that carries with it a high risk of complications and morbidity.  The differential diagnosis includes gastroenteritis, hyperemesis from pregnancy, appendicitis, other acute intra-abdominal infection  MDM: 32 year old female presents today for concern of abdominal pain, vomiting and inability tolerate p.o. intake.  Going on since Friday evening.  Abdomen soft.  No distention.  Mild tenderness that is generalized.  Not focal to the right lower quadrant.  Afebrile.  Given duration of symptoms, no leukocytosis, and afebrile low suspicion for appendicitis.  CBC without leukocytosis, hemoglobin at 10.5  but not significantly far from her baseline.  CMP with potassium of 3.3 otherwise no renal insufficiency or other acute finding.  UA without evidence of UTI.  hCG count uptrending appropriately currently at 30,000.  Lipase within normal.  Ultrasound shows single intrauterine gestational sac which measures 5 weeks, and 6 days.  No yolk sac or embryo was visualized on today's ultrasound.  Also shows moderate size subchorionic hemorrhage.  Discussed with on-call OB.  They recommend no intervention at this point.  They recommend that patient avoid sexual intercourse until viability is confirmed.  She has follow-up ultrasound scheduled for 10/14.  They recommend keeping that.  Discussed outpatient follow-up.  Will prescribe Diclegis.  She is appropriate for discharge.  Discharged in stable condition.  Return precautions discussed.  2 L fluid bolus given in the emergency department as well as IV potassium.  Lab Tests: -I ordered, reviewed, and interpreted labs.   The pertinent results include:   Labs Reviewed  CBC WITH DIFFERENTIAL/PLATELET - Abnormal; Notable for the following components:      Result Value   RBC 3.65 (*)    Hemoglobin 10.5 (*)    HCT 32.4 (*)    All other components within normal limits  COMPREHENSIVE METABOLIC PANEL - Abnormal; Notable for the following components:   Sodium 133 (*)    Potassium 3.3 (*)    CO2 17 (*)    Glucose, Bld 68 (*)    Calcium 8.8 (*)    AST 13 (*)    All other components within normal limits  URINALYSIS, W/ REFLEX TO CULTURE (INFECTION SUSPECTED) - Abnormal; Notable for the following components:   Specific Gravity, Urine 1.031 (*)    Ketones, ur 80 (*)    Protein, ur 100 (*)    Leukocytes,Ua TRACE (*)    All other components within normal limits  HCG, QUANTITATIVE, PREGNANCY - Abnormal; Notable for the following components:   hCG, Beta Chain, Quant, S 30,084 (*)    All other components within normal limits  LIPASE, BLOOD      EKG  EKG  Interpretation Date/Time:    Ventricular Rate:    PR Interval:    QRS Duration:    QT Interval:    QTC Calculation:   R Axis:      Text Interpretation:           Imaging Studies ordered: I ordered imaging studies including OB ultrasound I independently visualized and interpreted imaging. I agree with the radiologist interpretation   Medicines ordered and prescription drug management: Meds ordered this encounter  Medications   metoCLOPramide (REGLAN) injection 10 mg   lactated ringers bolus 1,000 mL   potassium chloride 10 mEq in 100 mL IVPB   ondansetron (ZOFRAN) injection 4 mg   lactated ringers bolus 1,000 mL    -I have reviewed the patients home medicines and have made adjustments as needed  Critical interventions IV fluids, antiemetics  Consultations Obtained: I requested consultation with the on-call OB,  and discussed lab and imaging findings as well as pertinent plan - they recommend: As above  Reevaluation: After the interventions noted above, I reevaluated the patient and found that they have :improved  Co morbidities that complicate the patient evaluation  Past Medical History:  Diagnosis Date   ADHD (attention deficit hyperactivity disorder)    Boils    Complication of anesthesia    Hypersensitive to anesthesia (wisdom teeth)   Headache    Hyperemesis gravidarum    Vaginal Pap smear, abnormal       Dispostion: Discharged in stable condition.  Return precaution discussed.  Patient voices understanding and she is in agreement with plan to discharge and follow-up outpatient.   Final Clinical Impression(s) / ED Diagnoses Final diagnoses:  Less than [redacted] weeks gestation of pregnancy  Nausea and vomiting during pregnancy    Rx / DC Orders ED Discharge Orders          Ordered    Doxylamine-Pyridoxine 10-10 MG TBEC  Daily at bedtime        06/11/23 1500              Marita Kansas, PA-C 06/11/23 1502    Bethann Berkshire, MD 06/12/23  1039

## 2023-06-11 NOTE — ED Triage Notes (Signed)
Patient arrived by ambulance reporting she is [redacted] weeks pregnant and vomiting. Denies any other complaints. No vaginal bleeding

## 2023-06-13 ENCOUNTER — Telehealth: Payer: Self-pay | Admitting: Student

## 2023-06-13 ENCOUNTER — Emergency Department (HOSPITAL_COMMUNITY)
Admission: EM | Admit: 2023-06-13 | Discharge: 2023-06-14 | Disposition: A | Discharge status: Home / Self Care | Payer: Medicaid Other | Attending: Emergency Medicine | Admitting: Emergency Medicine

## 2023-06-13 ENCOUNTER — Encounter (HOSPITAL_COMMUNITY): Payer: Self-pay

## 2023-06-13 ENCOUNTER — Other Ambulatory Visit: Payer: Self-pay

## 2023-06-13 DIAGNOSIS — R112 Nausea with vomiting, unspecified: Secondary | ICD-10-CM | POA: Diagnosis not present

## 2023-06-13 DIAGNOSIS — R42 Dizziness and giddiness: Secondary | ICD-10-CM | POA: Diagnosis not present

## 2023-06-13 DIAGNOSIS — R1111 Vomiting without nausea: Secondary | ICD-10-CM | POA: Diagnosis not present

## 2023-06-13 DIAGNOSIS — R110 Nausea: Secondary | ICD-10-CM | POA: Diagnosis not present

## 2023-06-13 DIAGNOSIS — Z3A01 Less than 8 weeks gestation of pregnancy: Secondary | ICD-10-CM | POA: Diagnosis not present

## 2023-06-13 DIAGNOSIS — O219 Vomiting of pregnancy, unspecified: Secondary | ICD-10-CM | POA: Diagnosis not present

## 2023-06-13 DIAGNOSIS — R531 Weakness: Secondary | ICD-10-CM | POA: Diagnosis not present

## 2023-06-13 LAB — CBC
HCT: 35.6 % — ABNORMAL LOW (ref 36.0–46.0)
Hemoglobin: 11.8 g/dL — ABNORMAL LOW (ref 12.0–15.0)
MCH: 28.7 pg (ref 26.0–34.0)
MCHC: 33.1 g/dL (ref 30.0–36.0)
MCV: 86.6 fL (ref 80.0–100.0)
Platelets: 392 10*3/uL (ref 150–400)
RBC: 4.11 MIL/uL (ref 3.87–5.11)
RDW: 12.4 % (ref 11.5–15.5)
WBC: 7.4 10*3/uL (ref 4.0–10.5)
nRBC: 0 % (ref 0.0–0.2)

## 2023-06-13 LAB — COMPREHENSIVE METABOLIC PANEL
ALT: 13 U/L (ref 0–44)
AST: 16 U/L (ref 15–41)
Albumin: 4.6 g/dL (ref 3.5–5.0)
Alkaline Phosphatase: 54 U/L (ref 38–126)
Anion gap: 15 (ref 5–15)
BUN: 9 mg/dL (ref 6–20)
CO2: 18 mmol/L — ABNORMAL LOW (ref 22–32)
Calcium: 9.3 mg/dL (ref 8.9–10.3)
Chloride: 102 mmol/L (ref 98–111)
Creatinine, Ser: 0.72 mg/dL (ref 0.44–1.00)
GFR, Estimated: >60 mL/min (ref >60)
Glucose, Bld: 81 mg/dL (ref 70–99)
Potassium: 3 mmol/L — ABNORMAL LOW (ref 3.5–5.1)
Sodium: 135 mmol/L (ref 135–145)
Total Bilirubin: 1.3 mg/dL — ABNORMAL HIGH (ref 0.3–1.2)
Total Protein: 8.7 g/dL — ABNORMAL HIGH (ref 6.5–8.1)

## 2023-06-13 MED ORDER — ONDANSETRON 4 MG PO TBDP
4.0000 mg | ORAL_TABLET | Freq: Once | ORAL | Status: AC
  Administered 2023-06-13: 4 mg via ORAL
  Filled 2023-06-13: qty 1

## 2023-06-13 NOTE — ED Provider Triage Note (Signed)
Emergency Medicine Provider Triage Evaluation Note  Sharon Barnett , a 32 y.o. female  was evaluated in triage.  Pt complains of nausea and vomiting for the past couple of days and some dizziness.  States she is about [redacted] weeks pregnant.  Reports she was seen here on 06/11/2019 and discharged with doxylamine pyridoxine but reports she has not been able to get this medication since her pharmacy does not carry it.  Review of Systems  Positive: As above Negative: As above  Physical Exam  BP 129/65   Pulse 96   Temp 98.8 F (37.1 C)   Resp 18   Ht 5\' 8"  (1.727 m)   Wt 60.7 kg   LMP 04/29/2023   SpO2 100%   BMI 20.35 kg/m  Gen:   Awake, no distress, is vomiting some Resp:  Normal effort  MSK:   Moves extremities without difficulty    Medical Decision Making  Medically screening exam initiated at 8:36 PM.  Appropriate orders placed.  Kimora C [place] was informed that the remainder of the evaluation will be completed by another provider, this initial triage assessment does not replace that evaluation, and the importance of remaining in the ED until their evaluation is complete.     Arabella Merles, PA-C 06/13/23 2041

## 2023-06-13 NOTE — Telephone Encounter (Signed)
*  AFTER HOURS/EMERGENCY LINE*  Received page that patient was vomiting, and that she is pregnant.  Called and spoke with patient's sister, could hear the patient retching and vomiting in the background. Sister reports that she is extremely weak, and has been throwing up everything that she tries to eat or drink.  She was unable to pick up the Diclegis from the pharmacy that was prescribed in the ER on 10/7.  She says that they were out of stock or they just did not have it.  I advised patient come to the ER for evaluation given severity of her symptoms and having weakness that she may require IV fluid resuscitation or may need further workup for her symptoms.  Patient would likely benefit from Zofran prescription.   Patient and her sister are in agreement with above plan.  They plan to come to the ER.  Darral Dash, DO PGY-3 [facility]

## 2023-06-13 NOTE — ED Notes (Signed)
Patient was unable to hold the zofran down.

## 2023-06-13 NOTE — ED Triage Notes (Signed)
BIB EMS from home for N/V since Friday. EMS reports pt had positive orthostatic vitals. BP went from 110 systolic to 96 systolic. C/o dizziness. Pt is [redacted] weeks pregnant. G3P1.

## 2023-06-13 NOTE — ED Notes (Signed)
Was informed by the secretary that the patient had called multiple times while sitting int he waiting room. Moved the patient to triage 2 to inquire how I could help her. Patient states she is feeling nauseous and she was supposed to be sent to [facility] to be seen by the [facility] Department. However EMS brought her here. Medication requested from the PA.

## 2023-06-14 ENCOUNTER — Telehealth (HOSPITAL_BASED_OUTPATIENT_CLINIC_OR_DEPARTMENT_OTHER): Payer: Self-pay | Admitting: Emergency Medicine

## 2023-06-14 LAB — HCG, QUANTITATIVE, PREGNANCY: hCG, Beta Chain, Quant, S: 53383 m[IU]/mL — ABNORMAL HIGH (ref <5)

## 2023-06-14 MED ORDER — METOCLOPRAMIDE HCL 5 MG/ML IJ SOLN
10.0000 mg | Freq: Once | INTRAMUSCULAR | Status: AC
  Administered 2023-06-14: 10 mg via INTRAVENOUS
  Filled 2023-06-14: qty 2

## 2023-06-14 MED ORDER — METHYLPREDNISOLONE SODIUM SUCC 125 MG IJ SOLR
125.0000 mg | Freq: Once | INTRAMUSCULAR | Status: AC
  Administered 2023-06-14: 125 mg via INTRAVENOUS
  Filled 2023-06-14: qty 2

## 2023-06-14 MED ORDER — LACTATED RINGERS IV BOLUS
1000.0000 mL | Freq: Once | INTRAVENOUS | Status: AC
  Administered 2023-06-14: 1000 mL via INTRAVENOUS

## 2023-06-14 MED ORDER — PREDNISONE 20 MG PO TABS: 40.0000 mg | ORAL_TABLET | Freq: Every day | ORAL | 0 refills | Status: DC

## 2023-06-14 MED ORDER — POTASSIUM CHLORIDE ER 10 MEQ PO TBCR
10.0000 meq | EXTENDED_RELEASE_TABLET | Freq: Every day | ORAL | 0 refills | Status: DC
Start: 2023-06-14 — End: 2023-06-14

## 2023-06-14 MED ORDER — POTASSIUM CHLORIDE CRYS ER 20 MEQ PO TBCR: 20.0000 meq | EXTENDED_RELEASE_TABLET | Freq: Once | ORAL | 0 refills | Status: DC

## 2023-06-14 MED ORDER — METOCLOPRAMIDE HCL 10 MG PO TABS: 10.0000 mg | ORAL_TABLET | Freq: Four times a day (QID) | ORAL | 0 refills | Status: DC

## 2023-06-14 MED ORDER — METOCLOPRAMIDE HCL 5 MG/ML IJ SOLN: 10.0000 mg | Freq: Once | INTRAMUSCULAR | Status: DC

## 2023-06-14 MED ORDER — PROMETHAZINE HCL 25 MG PO TABS: 25.0000 mg | ORAL_TABLET | Freq: Four times a day (QID) | ORAL | 0 refills | Status: DC | PRN

## 2023-06-14 MED ORDER — SODIUM CHLORIDE 0.9 % IV SOLN
12.5000 mg | INTRAVENOUS | Status: AC
  Administered 2023-06-14: 12.5 mg via INTRAVENOUS
  Filled 2023-06-14: qty 12.5

## 2023-06-14 MED ORDER — SODIUM CHLORIDE 0.9 % IV SOLN
12.5000 mg | Freq: Four times a day (QID) | INTRAVENOUS | Status: DC | PRN
  Administered 2023-06-14: 12.5 mg via INTRAVENOUS
  Filled 2023-06-14: qty 12.5

## 2023-06-14 MED ORDER — ONDANSETRON 4 MG PO TBDP
4.0000 mg | ORAL_TABLET | Freq: Three times a day (TID) | ORAL | 0 refills | Status: DC | PRN
Start: 2023-06-14 — End: 2023-06-14

## 2023-06-14 NOTE — Telephone Encounter (Cosign Needed)
Changing the patient's medications to phenergan and potassium and sending in the prednisone. Unfortunately was not able to get in touch with the patient. Confirmed with the pharmacy that she did not pick up the other medications, discussed with pharmacist to cancel the prescriptions and will be sending her in more.

## 2023-06-14 NOTE — Discharge Instructions (Addendum)
Please take tylenol/ibuprofen/naproxen for pain. I recommend close follow-up with PCP for reevaluation.  Please do not hesitate to return to emergency department if worrisome signs symptoms we discussed become apparent.

## 2023-06-14 NOTE — ED Provider Notes (Signed)
[place] EMERGENCY DEPARTMENT AT [facility] Provider Note   CSN: [id number] Arrival date & time: 06/13/23  1831     History  Chief Complaint  Patient presents with   Emesis During Pregnancy    Sharon Barnett is a 32 y.o. female presents today for evaluation of vomiting.  She was evaluated in the ER 3 days ago, was found to have a 5-week intrauterine pregnancy with subchorionic hemorrhage.  Patient return to the emergency department today due to continuing vomiting at home.  She was prescribed doxylamine pyridoxine but the pharmacy was not able to fill the medication.  She denies fever, chest pain, shortness of breath, abdominal pain, vaginal bleeding or vaginal discharge.  HPI  Past Medical History:  Diagnosis Date   ADHD (attention deficit hyperactivity disorder)    Boils    Complication of anesthesia    Hypersensitive to anesthesia (wisdom teeth)   Headache    Hyperemesis gravidarum    Vaginal Pap smear, abnormal    Past Surgical History:  Procedure Laterality Date   OTHER SURGICAL HISTORY     elective abortion   WISDOM TOOTH EXTRACTION       Home Medications Prior to Admission medications   Medication Sig Start Date End Date Taking? Authorizing Provider  metoCLOPramide (REGLAN) 10 MG tablet Take 1 tablet (10 mg total) by mouth every 6 (six) hours. 06/14/23  Yes Jeanelle Malling, PA  ondansetron (ZOFRAN-ODT) 4 MG disintegrating tablet Take 4 mg by mouth every 8 (eight) hours as needed for nausea or vomiting. 05/29/23  Yes [provider]  ondansetron (ZOFRAN-ODT) 4 MG disintegrating tablet Take 1 tablet (4 mg total) by mouth every 8 (eight) hours as needed. 06/14/23  Yes Jeanelle Malling, PA  potassium chloride (KLOR-CON) 10 MEQ tablet Take 1 tablet (10 mEq total) by mouth daily. 06/14/23  Yes Jeanelle Malling, PA  Doxylamine-Pyridoxine 10-10 MG TBEC Take 1 tablet by mouth at bedtime. Patient not taking: Reported on 06/14/2023 06/11/23 07/11/23  Marita Kansas, PA-C  zolpidem  (AMBIEN CR) 6.25 MG CR tablet Take 1 tablet (6.25 mg total) by mouth at bedtime as needed for sleep. Patient not taking: Reported on 02/04/2019 07/11/16 05/20/19  Tillman Sers, DO      Allergies    Patient has no known allergies.    Review of Systems   Review of Systems Negative except as per HPI.  Physical Exam Updated Vital Signs BP 118/77   Pulse 78   Temp 98.4 F (36.9 C) (Oral)   Resp 12   Ht 5\' 8"  (1.727 m)   Wt 60.7 kg   LMP 04/29/2023   SpO2 98%   BMI 20.35 kg/m  Physical Exam Vitals and nursing note reviewed.  Constitutional:      Appearance: Normal appearance.  HENT:     Head: Normocephalic and atraumatic.     Mouth/Throat:     Mouth: Mucous membranes are moist.  Eyes:     General: No scleral icterus. Cardiovascular:     Rate and Rhythm: Normal rate and regular rhythm.     Pulses: Normal pulses.     Heart sounds: Normal heart sounds.  Pulmonary:     Effort: Pulmonary effort is normal.     Breath sounds: Normal breath sounds.  Abdominal:     General: Abdomen is flat.     Palpations: Abdomen is soft.     Tenderness: There is no abdominal tenderness.  Musculoskeletal:        General: No  deformity.  Skin:    General: Skin is warm.     Findings: No rash.  Neurological:     General: No focal deficit present.     Mental Status: She is alert.  Psychiatric:        Mood and Affect: Mood normal.     ED Results / Procedures / Treatments   Labs (all labs ordered are listed, but only abnormal results are displayed) Labs Reviewed  CBC - Abnormal; Notable for the following components:      Result Value   Hemoglobin 11.8 (*)    HCT 35.6 (*)    All other components within normal limits  COMPREHENSIVE METABOLIC PANEL - Abnormal; Notable for the following components:   Potassium 3.0 (*)    CO2 18 (*)    Total Protein 8.7 (*)    Total Bilirubin 1.3 (*)    All other components within normal limits  HCG, QUANTITATIVE, PREGNANCY - Abnormal; Notable for the  following components:   hCG, Beta Chain, Quant, S [id number] (*)    All other components within normal limits    EKG None  Radiology No results found.  Procedures Procedures    Medications Ordered in ED Medications  ondansetron (ZOFRAN-ODT) disintegrating tablet 4 mg (4 mg Oral Given 06/13/23 2036)  ondansetron (ZOFRAN-ODT) disintegrating tablet 4 mg (4 mg Oral Given 06/13/23 2053)  metoCLOPramide (REGLAN) injection 10 mg (10 mg Intravenous Given 06/14/23 0249)    ED Course/ Medical Decision Making/ A&P                                 Medical Decision Making Amount and/or Complexity of Data Reviewed Labs: ordered.  Risk Prescription drug management.   This patient presents to the ED for vomiting, this involves an extensive number of treatment options, and is a complaint that carries with a high risk of complications and morbidity.  The differential diagnosis includes small bowel obstruction, acute gastroenteritis, appendicitis, gallbladder/biliary, pancreatitis, peptic ulcer disease, perforation, increased ICP meningitis, vertigo ACS/MI DKA, EtOH intoxication, cannabinoid hyperemesis.  This is not an exhaustive list.  Lab tests: I ordered and personally interpreted labs.  The pertinent results include: WBC unremarkable. Hbg unremarkable. Platelets unremarkable. Electrolytes significant for K 3.0. BUN, creatinine unremarkable. Hcg [id number].   Problem list/ ED course/ Critical interventions/ Medical management: HPI: See above Vital signs within normal range and stable throughout visit. Laboratory/imaging studies significant for: See above. On physical examination, patient is afebrile and appears in no acute distress.  Patient return to the emergency department due to continuing vomiting.  She had a ultrasound 3 days ago which showed a 5-week intrauterine pregnancy with subchorionic bleeding.  She denies any vaginal bleeding or abnormal vaginal discharge today.  She was unable to  refill her doxylamine pyridoxine due to pharmacy issue.  I have low suspicion for ectopic pregnancy or abortion.  Given Zofran and Reglan.  Reevaluation of patient after these medications showed that her symptom improved.  However patient had another episode of vomiting at discharge.  I ordered another dose of Phenergan for vomiting. I have reviewed the patient home medicines and have made adjustments as needed.  Cardiac monitoring/EKG: The patient was maintained on a cardiac monitor.  I personally reviewed and interpreted the cardiac monitor which showed an underlying rhythm of: sinus rhythm.  Additional history obtained: External records from outside source obtained and reviewed including: Chart review including previous notes, labs, imaging.  Consultations obtained: I spoke to Dr. Macon Large OB-GYN.  She recommended discharge if patient is able to tolerate p.o.  Recommended discharge home with Reglan and Zofran and outpatient OB/GYN follow-up.  Disposition Patient care signed out to change to regular insulin, PA-C with pending antiemetics and p.o. challenge. This chart was dictated using voice recognition software.  Despite best efforts to proofread,  errors can occur which can change the documentation meaning.          Final Clinical Impression(s) / ED Diagnoses Final diagnoses:  Nausea/vomiting in pregnancy    Rx / DC Orders ED Discharge Orders          Ordered    metoCLOPramide (REGLAN) 10 MG tablet  Every 6 hours        06/14/23 0541    ondansetron (ZOFRAN-ODT) 4 MG disintegrating tablet  Every 8 hours PRN        06/14/23 0541    potassium chloride (KLOR-CON) 10 MEQ tablet  Daily        06/14/23 0541              Jeanelle Malling, PA 06/14/23 4540    Tilden Fossa, MD 06/14/23 1004

## 2023-06-14 NOTE — ED Provider Notes (Signed)
  Physical Exam  BP 125/69 (BP Location: Right Arm)   Pulse 64   Temp 99.2 F (37.3 C)   Resp 18   Ht 5\' 8"  (1.727 m)   Wt 60.7 kg   LMP 04/29/2023   SpO2 99%   BMI 20.35 kg/m    Hand off from Le, PA-C for PO challenge. Patient has had Zofran and Reglan with emesis. She reports that she had this issue with her other pregnancies. Will try phenergan.  After phenergan, the patient is still having nausea and vomiting with water. Consulted OBGYN and spoke with Dr. Despina Hidden. He recommends 125 of solumedrol with some more anti nausea medication. He reports that if this is successful, she can go home on 40mg  every day for 10 days. If not, he is requesting a call back.   The patient was given the solumedrol, another 12.5mg  of phenergan as well as IL of IVF.   On re-evaluation, patient reports that she is feeling better. Asked nursing staff to PO challenge.   Unfortunately, the patient was inadvertently discharged by nursing staff prior to discharge order being placed.  According to staff however she was not having any emesis after the dose of the Solu-Medrol and Phenergan.  I have attempted to contact the patient multiple times however have not been able to answer.  She does not have a detailed voicemail however I did leave her a HIPAA compliant voicemail asking for call back however of not received one.  I did call her pharmacy to cancel her Reglan and Zofran as this did not work for her and was sent in by the previous provider.  I have sent her in some Phenergan as well as potassium and the prednisone like previously discussed with OB/GYN.       Achille Rich, PA-C 06/14/23 1738    Laurence Spates, MD 06/16/23 [phone]

## 2023-06-17 ENCOUNTER — Inpatient Hospital Stay (HOSPITAL_COMMUNITY)
Admission: AD | Admit: 2023-06-17 | Discharge: 2023-06-18 | Disposition: A | Discharge status: Home / Self Care | Payer: Medicaid Other | Attending: Emergency Medicine | Admitting: Emergency Medicine

## 2023-06-17 ENCOUNTER — Other Ambulatory Visit: Payer: Self-pay

## 2023-06-17 ENCOUNTER — Telehealth: Payer: Self-pay | Admitting: Student

## 2023-06-17 ENCOUNTER — Encounter (HOSPITAL_COMMUNITY): Payer: Self-pay

## 2023-06-17 DIAGNOSIS — R1084 Generalized abdominal pain: Secondary | ICD-10-CM | POA: Diagnosis not present

## 2023-06-17 DIAGNOSIS — Z3A01 Less than 8 weeks gestation of pregnancy: Secondary | ICD-10-CM | POA: Insufficient documentation

## 2023-06-17 DIAGNOSIS — R112 Nausea with vomiting, unspecified: Secondary | ICD-10-CM | POA: Diagnosis not present

## 2023-06-17 DIAGNOSIS — O210 Mild hyperemesis gravidarum: Secondary | ICD-10-CM | POA: Insufficient documentation

## 2023-06-17 DIAGNOSIS — R531 Weakness: Secondary | ICD-10-CM | POA: Diagnosis not present

## 2023-06-17 DIAGNOSIS — R000 Tachycardia, unspecified: Secondary | ICD-10-CM | POA: Diagnosis not present

## 2023-06-17 LAB — COMPREHENSIVE METABOLIC PANEL
ALT: 14 U/L (ref 0–44)
AST: 25 U/L (ref 15–41)
Albumin: 4.3 g/dL (ref 3.5–5.0)
Alkaline Phosphatase: 55 U/L (ref 38–126)
Anion gap: 17 — ABNORMAL HIGH (ref 5–15)
BUN: 12 mg/dL (ref 6–20)
CO2: 19 mmol/L — ABNORMAL LOW (ref 22–32)
Calcium: 10.2 mg/dL (ref 8.9–10.3)
Chloride: 101 mmol/L (ref 98–111)
Creatinine, Ser: 0.75 mg/dL (ref 0.44–1.00)
GFR, Estimated: >60 mL/min (ref >60)
Glucose, Bld: 102 mg/dL — ABNORMAL HIGH (ref 70–99)
Potassium: 4 mmol/L (ref 3.5–5.1)
Sodium: 137 mmol/L (ref 135–145)
Total Bilirubin: 1.4 mg/dL — ABNORMAL HIGH (ref 0.3–1.2)
Total Protein: 8 g/dL (ref 6.5–8.1)

## 2023-06-17 LAB — CBC WITH DIFFERENTIAL/PLATELET
Abs Immature Granulocytes: 0.03 10*3/uL (ref 0.00–0.07)
Basophils Absolute: 0 10*3/uL (ref 0.0–0.1)
Basophils Relative: 0 %
Eosinophils Absolute: 0 10*3/uL (ref 0.0–0.5)
Eosinophils Relative: 0 %
HCT: 39.1 % (ref 36.0–46.0)
Hemoglobin: 13.2 g/dL (ref 12.0–15.0)
Immature Granulocytes: 0 %
Lymphocytes Relative: 27 %
Lymphs Abs: 3 10*3/uL (ref 0.7–4.0)
MCH: 28.6 pg (ref 26.0–34.0)
MCHC: 33.8 g/dL (ref 30.0–36.0)
MCV: 84.8 fL (ref 80.0–100.0)
Monocytes Absolute: 0.5 10*3/uL (ref 0.1–1.0)
Monocytes Relative: 5 %
Neutro Abs: 7.4 10*3/uL (ref 1.7–7.7)
Neutrophils Relative %: 68 %
Platelets: 481 10*3/uL — ABNORMAL HIGH (ref 150–400)
RBC: 4.61 MIL/uL (ref 3.87–5.11)
RDW: 12.6 % (ref 11.5–15.5)
WBC: 11 10*3/uL — ABNORMAL HIGH (ref 4.0–10.5)
nRBC: 0 % (ref 0.0–0.2)

## 2023-06-17 LAB — HCG, QUANTITATIVE, PREGNANCY: hCG, Beta Chain, Quant, S: 157269 m[IU]/mL — ABNORMAL HIGH (ref <5)

## 2023-06-17 MED ORDER — LACTATED RINGERS IV BOLUS
1000.0000 mL | Freq: Once | INTRAVENOUS | Status: AC
  Administered 2023-06-17: 1000 mL via INTRAVENOUS

## 2023-06-17 MED ORDER — METOCLOPRAMIDE HCL 5 MG/ML IJ SOLN
INTRAMUSCULAR | Status: AC
  Filled 2023-06-17: qty 2

## 2023-06-17 MED ORDER — METOCLOPRAMIDE HCL 5 MG/ML IJ SOLN
10.0000 mg | Freq: Once | INTRAMUSCULAR | Status: AC
  Administered 2023-06-17: 10 mg via INTRAVENOUS
  Filled 2023-06-17: qty 2

## 2023-06-17 MED ORDER — SCOPOLAMINE 1 MG/3DAYS TD PT72
1.0000 | MEDICATED_PATCH | TRANSDERMAL | Status: DC
  Administered 2023-06-17: 1.5 mg via TRANSDERMAL
  Filled 2023-06-17: qty 1

## 2023-06-17 MED ORDER — ONDANSETRON HCL 4 MG/2ML IJ SOLN: 4.0000 mg | Freq: Once | INTRAMUSCULAR | Status: DC

## 2023-06-17 NOTE — Telephone Encounter (Signed)
Received after-hours page by mother of patient.  She states patient has been throwing up all day and has gone to the emergency room a couple times for continued vomiting.  Of note she is pregnant.  She is continuing to vomit, unable to keep down fluids and has not urinated today.  I recommended promptly going to [facility]U at [facility] for concern of dehydration.  She voiced understanding of this and stated they would go there.

## 2023-06-17 NOTE — ED Provider Notes (Signed)
[place] EMERGENCY DEPARTMENT AT [facility] Provider Note   CSN: [id number] Arrival date & time: 06/17/23  2123     History  Chief Complaint  Patient presents with   Emesis During Pregnancy    Sharon Barnett is a 32 y.o. female here presenting with vomiting.  Patient is about [redacted] weeks pregnant and had a recent ultrasound that showed subchorionic hemorrhage.  Patient is here with vomiting.  Denies vaginal bleeding.  The history is provided by the patient.       Home Medications Prior to Admission medications   Medication Sig Start Date End Date Taking? Authorizing Provider  Doxylamine-Pyridoxine 10-10 MG TBEC Take 1 tablet by mouth at bedtime. Patient not taking: Reported on 06/14/2023 06/11/23 07/11/23  Marita Kansas, PA-C  ondansetron (ZOFRAN-ODT) 4 MG disintegrating tablet Take 4 mg by mouth every 8 (eight) hours as needed for nausea or vomiting. 05/29/23   [provider]  potassium chloride SA (KLOR-CON M) 20 MEQ tablet Take 1 tablet (20 mEq total) by mouth once for 1 dose. 06/14/23 06/14/23  Achille Rich, PA-C  predniSONE (DELTASONE) 20 MG tablet Take 2 tablets (40 mg total) by mouth daily for 10 days. 06/14/23 06/24/23  Achille Rich, PA-C  promethazine (PHENERGAN) 25 MG tablet Take 1 tablet (25 mg total) by mouth every 6 (six) hours as needed for nausea or vomiting. 06/14/23   Achille Rich, PA-C  zolpidem (AMBIEN CR) 6.25 MG CR tablet Take 1 tablet (6.25 mg total) by mouth at bedtime as needed for sleep. Patient not taking: Reported on 02/04/2019 07/11/16 05/20/19  Tillman Sers, DO      Allergies    Patient has no known allergies.    Review of Systems   Review of Systems  Gastrointestinal:  Positive for vomiting.  All other systems reviewed and are negative.   Physical Exam Updated Vital Signs BP (!) 123/97   Pulse 98   Temp 98.2 F (36.8 C)   Resp 18   Ht 5\' 8"  (1.727 m)   Wt 58.1 kg   LMP 04/29/2023   SpO2 100%   BMI 19.48 kg/m   Physical Exam Vitals and nursing note reviewed.  Constitutional:      Comments: Vomiting and dehydrated  HENT:     Head: Normocephalic.     Nose: Nose normal.     Mouth/Throat:     Mouth: Mucous membranes are dry.  Eyes:     Extraocular Movements: Extraocular movements intact.     Pupils: Pupils are equal, round, and reactive to light.  Cardiovascular:     Rate and Rhythm: Normal rate and regular rhythm.     Pulses: Normal pulses.  Pulmonary:     Effort: Pulmonary effort is normal.     Breath sounds: Normal breath sounds.  Abdominal:     General: Abdomen is flat.     Palpations: Abdomen is soft.  Musculoskeletal:        General: Normal range of motion.     Cervical back: Normal range of motion and neck supple.  Skin:    General: Skin is warm.     Capillary Refill: Capillary refill takes less than 2 seconds.  Neurological:     General: No focal deficit present.     Mental Status: She is oriented to person, place, and time.  Psychiatric:        Mood and Affect: Mood normal.        Behavior: Behavior normal.  ED Results / Procedures / Treatments   Labs (all labs ordered are listed, but only abnormal results are displayed) Labs Reviewed  CBC WITH DIFFERENTIAL/PLATELET  COMPREHENSIVE METABOLIC PANEL  HCG, QUANTITATIVE, PREGNANCY    EKG None  Radiology No results found.  Procedures Procedures    Medications Ordered in ED Medications  lactated ringers bolus 1,000 mL (has no administration in time range)  ondansetron (ZOFRAN) injection 4 mg (has no administration in time range)    ED Course/ Medical Decision Making/ A&P                                 Medical Decision Making Sharon Barnett is a 32 y.o. female here with vomiting.  Patient is about [redacted] weeks pregnant confirmed on ultrasound.  Patient is actively vomiting.  Discussed with Dr. Shawnie Pons and will transfer to MAU.  I ordered CBC and CMP and hCG.  Also ordered 1 L LR bolus as well as  Zofran.   Problems Addressed: Hyperemesis gravidarum: acute illness or injury  Amount and/or Complexity of Data Reviewed Labs: ordered. Decision-making details documented in ED Course.  Risk Prescription drug management.    Final Clinical Impression(s) / ED Diagnoses Final diagnoses:  None    Rx / DC Orders ED Discharge Orders     None         Charlynne Pander, MD 06/17/23 2138

## 2023-06-17 NOTE — Progress Notes (Deleted)
History     CSN: [id number]  Arrival date and time: 06/17/23 2123   None     Chief Complaint  Patient presents with   Emesis During Pregnancy   HPI  {GYN/OB [id number]}  Past Medical History:  Diagnosis Date   ADHD (attention deficit hyperactivity disorder)    Boils    Complication of anesthesia    Hypersensitive to anesthesia (wisdom teeth)   Headache    Hyperemesis gravidarum    Vaginal Pap smear, abnormal     Past Surgical History:  Procedure Laterality Date   OTHER SURGICAL HISTORY     elective abortion   WISDOM TOOTH EXTRACTION      Family History  Problem Relation Age of Onset   Depression Mother    Diabetes Mother    Hypertension Mother    Miscarriages / [place] Mother    Asthma Sister    Depression Sister    Hearing loss Sister    Birth defects Sister    Hyperlipidemia Sister    Hypertension Sister    Miscarriages / Stillbirths Sister    Asthma Maternal Grandmother    Cancer Maternal Grandmother    Diabetes Maternal Grandmother    Hypertension Maternal Grandmother    Stroke Maternal Grandmother    Vision loss Maternal Grandmother    Cancer Paternal Grandmother     Social History   Tobacco Use   Smoking status: Never   Smokeless tobacco: Never  Vaping Use   Vaping status: Never Used  Substance Use Topics   Alcohol use: No   Drug use: No    Allergies: No Known Allergies  Medications Prior to Admission  Medication Sig Dispense Refill Last Dose   Doxylamine-Pyridoxine 10-10 MG TBEC Take 1 tablet by mouth at bedtime. (Patient not taking: Reported on 06/14/2023) 30 tablet 0    ondansetron (ZOFRAN-ODT) 4 MG disintegrating tablet Take 4 mg by mouth every 8 (eight) hours as needed for nausea or vomiting.      potassium chloride SA (KLOR-CON M) 20 MEQ tablet Take 1 tablet (20 mEq total) by mouth once for 1 dose. 5 tablet 0    predniSONE (DELTASONE) 20 MG tablet Take 2 tablets (40 mg total) by mouth daily for 10 days. 20 tablet 0     promethazine (PHENERGAN) 25 MG tablet Take 1 tablet (25 mg total) by mouth every 6 (six) hours as needed for nausea or vomiting. 12 tablet 0     Review of Systems Physical Exam   Blood pressure 128/83, pulse 98, temperature 98 F (36.7 C), resp. rate 20, height 5\' 8"  (1.727 m), weight 58.1 kg, last menstrual period 04/29/2023, SpO2 100%, unknown if currently breastfeeding.  Physical Exam  Fetal Assessment *** bpm, Mod Var, -Decels, +Accels Toco:   MAU Course   Results for orders placed or performed during the hospital encounter of 06/17/23 (from the past 24 hour(s))  CBC with Differential     Status: Abnormal   Collection Time: 06/17/23  9:25 PM  Result Value Ref Range   WBC 11.0 (H) 4.0 - 10.5 K/uL   RBC 4.61 3.87 - 5.11 MIL/uL   Hemoglobin 13.2 12.0 - 15.0 g/dL   HCT 78.2 95.6 - 21.3 %   MCV 84.8 80.0 - 100.0 fL   MCH 28.6 26.0 - 34.0 pg   MCHC 33.8 30.0 - 36.0 g/dL   RDW 08.6 57.8 - 46.9 %   Platelets 481 (H) 150 - 400 K/uL   nRBC 0.0 0.0 - 0.2 %  Neutrophils Relative % 68 %   Neutro Abs 7.4 1.7 - 7.7 K/uL   Lymphocytes Relative 27 %   Lymphs Abs 3.0 0.7 - 4.0 K/uL   Monocytes Relative 5 %   Monocytes Absolute 0.5 0.1 - 1.0 K/uL   Eosinophils Relative 0 %   Eosinophils Absolute 0.0 0.0 - 0.5 K/uL   Basophils Relative 0 %   Basophils Absolute 0.0 0.0 - 0.1 K/uL   Immature Granulocytes 0 %   Abs Immature Granulocytes 0.03 0.00 - 0.07 K/uL  Comprehensive metabolic panel     Status: Abnormal   Collection Time: 06/17/23  9:25 PM  Result Value Ref Range   Sodium 137 135 - 145 mmol/L   Potassium 4.0 3.5 - 5.1 mmol/L   Chloride 101 98 - 111 mmol/L   CO2 19 (L) 22 - 32 mmol/L   Glucose, Bld 102 (H) 70 - 99 mg/dL   BUN 12 6 - 20 mg/dL   Creatinine, Ser 4.09 0.44 - 1.00 mg/dL   Calcium 81.1 8.9 - 91.4 mg/dL   Total Protein 8.0 6.5 - 8.1 g/dL   Albumin 4.3 3.5 - 5.0 g/dL   AST 25 15 - 41 U/L   ALT 14 0 - 44 U/L   Alkaline Phosphatase 55 38 - 126 U/L   Total  Bilirubin 1.4 (H) 0.3 - 1.2 mg/dL   GFR, Estimated >78 >29 mL/min   Anion gap 17 (H) 5 - 15  hCG, quantitative, pregnancy     Status: Abnormal   Collection Time: 06/17/23  9:25 PM  Result Value Ref Range   hCG, Beta Chain, Quant, S 157,269 (H) <5 mIU/mL   No results found.  MDM PE Labs: EFM  Assessment and Plan  *** G***P***  SIUP at ***weeks Cat *** FT   -Exam findings discussed. Richardson Landry MSN, CNM 06/17/2023, 11:11 PM

## 2023-06-17 NOTE — ED Triage Notes (Signed)
Pt BIBEMS w/ c/o Nausea & vomiting, generalized weakness. As per report pt [redacted] weeks pregnant & has not urinated since Friday

## 2023-06-17 NOTE — MAU Note (Signed)
..  Sharon Barnett is a 32 y.o. at [redacted]w[redacted]d here in MAU from [facility] reporting: emesis since last Friday. Patient is gagging and spitting in triage and reports she is too weak to get on scale.  Patient brought into room and assisted on to bed.  Reports she has vomited too many times to count. Reports lower abdominal pain. Denies vaginal bleeding.   Reports she has medication prescribed but has not been able to keep them down. n Pain score: 10/10

## 2023-06-18 ENCOUNTER — Other Ambulatory Visit: Payer: Medicaid Other

## 2023-06-18 ENCOUNTER — Telehealth: Payer: Self-pay | Admitting: Family Medicine

## 2023-06-18 MED ORDER — PANTOPRAZOLE SODIUM 40 MG PO TBEC: 40.0000 mg | DELAYED_RELEASE_TABLET | Freq: Every day | ORAL | 0 refills | Status: DC

## 2023-06-18 MED ORDER — ONDANSETRON HCL 4 MG PO TABS: 4.0000 mg | ORAL_TABLET | Freq: Three times a day (TID) | ORAL | Status: DC | PRN

## 2023-06-18 MED ORDER — INFUVITE ADULT IV SOLN
Freq: Once | INTRAVENOUS | Status: DC
  Filled 2023-06-18: qty 10

## 2023-06-18 MED ORDER — TRANSDERM-SCOP 1 MG/3DAYS TD PT72: 1.0000 | MEDICATED_PATCH | TRANSDERMAL | 0 refills | Status: DC

## 2023-06-18 MED ORDER — SODIUM CHLORIDE 0.9 % IV SOLN
8.0000 mg | Freq: Once | INTRAVENOUS | Status: DC
  Filled 2023-06-18: qty 4

## 2023-06-18 MED ORDER — SODIUM CHLORIDE 0.9 % IV SOLN
12.5000 mg | Freq: Once | INTRAVENOUS | Status: AC
  Administered 2023-06-18: 12.5 mg via INTRAVENOUS
  Filled 2023-06-18: qty 0.5

## 2023-06-18 MED ORDER — GLYCOPYRROLATE 0.2 MG/ML IJ SOLN
0.2000 mg | Freq: Once | INTRAMUSCULAR | Status: AC
  Administered 2023-06-18: 0.2 mg via INTRAVENOUS
  Filled 2023-06-18: qty 1

## 2023-06-18 MED ORDER — DOCUSATE SODIUM 100 MG PO CAPS: 100.0000 mg | ORAL_CAPSULE | Freq: Two times a day (BID) | ORAL | 0 refills | Status: DC

## 2023-06-18 NOTE — Discharge Instructions (Addendum)
Safe Medications in Pregnancy    Acne: Benzoyl Peroxide Salicylic Acid  Backache/Headache: Tylenol: 2 regular strength every 4 hours OR              2 Extra strength every 6 hours  Colds/Coughs/Allergies: Benadryl (alcohol free) 25 mg every 6 hours as needed Breath right strips Claritin Cepacol throat lozenges Chloraseptic throat spray Cold-Eeze- up to three times per day Cough drops, alcohol free Flonase (by prescription only) Guaifenesin Mucinex Robitussin DM (plain only, alcohol free) Saline nasal spray/drops Sudafed (pseudoephedrine) & Actifed ** use only after [redacted] weeks gestation and if you do not have high blood pressure Tylenol Vicks Vaporub Zinc lozenges Zyrtec   Constipation: Colace Ducolax suppositories Fleet enema Glycerin suppositories Metamucil Milk of magnesia Miralax Senokot Smooth move tea  Diarrhea: Kaopectate Imodium A-D  *NO pepto Bismol  Hemorrhoids: Anusol Anusol HC Preparation H Tucks  Indigestion: Tums Maalox Mylanta Zantac  Pepcid  Insomnia: Benadryl (alcohol free) 25mg  every 6 hours as needed Tylenol PM Unisom, no Gelcaps  Leg Cramps: Tums MagGel  Nausea/Vomiting:  Bonine Dramamine Emetrol Ginger extract Sea bands Meclizine  Nausea medication to take during pregnancy:  Unisom (doxylamine succinate 25 mg tablets) Take one tablet daily at bedtime. If symptoms are not adequately controlled, the dose can be increased to a maximum recommended dose of two tablets daily (1/2 tablet in the morning, 1/2 tablet mid-afternoon and one at bedtime). Vitamin B6 100mg  tablets. Take one tablet twice a day (up to 200 mg per day).  Skin Rashes: Aveeno products Benadryl cream or 25mg  every 6 hours as needed Calamine Lotion 1% cortisone cream  Yeast infection: Gyne-lotrimin 7 Monistat 7   **If taking multiple medications, please check labels to avoid duplicating the same active ingredients **take  medication as directed on the label ** Do not exceed 4000 mg of tylenol in 24 hours **Do not take medications that contain aspirin or ibuprofen     [employer] for [employer] at [employer]             [address] [place], [place] [address] [phone]  Center for [employer] at [facility]                                                             [address], [place], [place], [address] [phone]  Center for [facility] at [facility] [address], [place], [place], [address] [phone]  Center for [facility] at [facility] [address], [address], [place], [place], [address] [phone]  Center for [facility] Healthcare at [facility]                                 [address] [place], [place], [place], [address] [phone]  Center for [facility] Healthcare at [facility]  [address] [place], [place], [address] [phone]  Center for [facility] Healthcare at [facility] [address], [place], [place], [address]                              [facility] [address], [place], [place], [address] [phone]  [facility]         Phone: [phone]  [facility]      Phone: [phone]   [facility] and [facility]      Phone: [phone]  [facility] Department-Family Planning         Phone: [phone]   [facility] Health Department-Maternity    Phone: [phone]  [facility]      Phone: [phone]  [facility]     Phone: [phone]  [facility]        Phone: [phone]  [facility] ([address] [place]) [phone]  [facility]      Phone: [phone]

## 2023-06-18 NOTE — Telephone Encounter (Signed)
Attempted to reach patient about needing to reschedule her appointment to next week. Left a detailed voicemail message.

## 2023-06-19 ENCOUNTER — Other Ambulatory Visit: Payer: Self-pay

## 2023-06-19 DIAGNOSIS — O210 Mild hyperemesis gravidarum: Secondary | ICD-10-CM

## 2023-06-19 MED ORDER — PROMETHAZINE HCL 25 MG PO TABS
25.0000 mg | ORAL_TABLET | Freq: Four times a day (QID) | ORAL | 0 refills | Status: DC | PRN
Start: 2023-06-19 — End: 2023-06-20

## 2023-06-19 NOTE — Telephone Encounter (Signed)
Patient's mother calls nurse line regarding issues with picking up nausea medication.   Called pharmacy. Scopolamine patches are not stocked at [facility] on [address]. They are considered a specialty drug that has to be shipped from another pharmacy. Estimated delivery date is tomorrow.   Mother also asked about promethazine prescription. Reports that this is the only medication that has really helped with her nausea. Pharmacist states that medication is out of refills. Will forward refill request to PCP.   Called mother and informed. She states that they are transferred scopolamine patches to [facility].   Veronda Prude, RN

## 2023-06-19 NOTE — Telephone Encounter (Signed)
Provided short course of phenergan for nausea. Pt will need follow-up before next refill as phenergan is not preferred in pregnancy.

## 2023-06-20 ENCOUNTER — Encounter (HOSPITAL_COMMUNITY): Payer: Self-pay | Admitting: Obstetrics & Gynecology

## 2023-06-20 ENCOUNTER — Telehealth: Payer: Self-pay | Admitting: Family Medicine

## 2023-06-20 ENCOUNTER — Other Ambulatory Visit: Payer: Self-pay

## 2023-06-20 ENCOUNTER — Inpatient Hospital Stay (HOSPITAL_COMMUNITY)
Admission: AD | Admit: 2023-06-20 | Discharge: 2023-06-20 | Disposition: A | Discharge status: Home / Self Care | Payer: Medicaid Other | Attending: Obstetrics & Gynecology | Admitting: Obstetrics & Gynecology

## 2023-06-20 DIAGNOSIS — Z3A01 Less than 8 weeks gestation of pregnancy: Secondary | ICD-10-CM | POA: Insufficient documentation

## 2023-06-20 DIAGNOSIS — O210 Mild hyperemesis gravidarum: Secondary | ICD-10-CM | POA: Insufficient documentation

## 2023-06-20 DIAGNOSIS — O26891 Other specified pregnancy related conditions, first trimester: Secondary | ICD-10-CM | POA: Diagnosis present

## 2023-06-20 DIAGNOSIS — R531 Weakness: Secondary | ICD-10-CM | POA: Insufficient documentation

## 2023-06-20 LAB — URINALYSIS, ROUTINE W REFLEX MICROSCOPIC
Bilirubin Urine: NEGATIVE
Glucose, UA: NEGATIVE mg/dL
Hgb urine dipstick: NEGATIVE
Ketones, ur: 80 mg/dL — AB
Nitrite: NEGATIVE
Protein, ur: 30 mg/dL — AB
Specific Gravity, Urine: 1.033 — ABNORMAL HIGH (ref 1.005–1.030)
pH: 5 (ref 5.0–8.0)

## 2023-06-20 MED ORDER — LACTATED RINGERS IV BOLUS
1000.0000 mL | Freq: Once | INTRAVENOUS | Status: AC
  Administered 2023-06-20: 1000 mL via INTRAVENOUS

## 2023-06-20 MED ORDER — PROMETHAZINE HCL 25 MG PO TABS: 25.0000 mg | ORAL_TABLET | Freq: Four times a day (QID) | ORAL | 0 refills | Status: DC | PRN

## 2023-06-20 MED ORDER — GLYCOPYRROLATE 0.2 MG/ML IJ SOLN
0.2000 mg | Freq: Once | INTRAMUSCULAR | Status: AC
  Administered 2023-06-20: 0.2 mg via INTRAVENOUS
  Filled 2023-06-20: qty 1

## 2023-06-20 MED ORDER — PREDNISONE 20 MG PO TABS: 40.0000 mg | ORAL_TABLET | Freq: Every day | ORAL | 0 refills | Status: AC

## 2023-06-20 MED ORDER — SCOPOLAMINE 1 MG/3DAYS TD PT72
1.0000 | MEDICATED_PATCH | TRANSDERMAL | Status: DC
  Administered 2023-06-20: 1.5 mg via TRANSDERMAL
  Filled 2023-06-20: qty 1

## 2023-06-20 MED ORDER — ONDANSETRON 4 MG PO TBDP
8.0000 mg | ORAL_TABLET | Freq: Once | ORAL | Status: DC
  Filled 2023-06-20: qty 2

## 2023-06-20 MED ORDER — SCOPOLAMINE 1 MG/3DAYS TD PT72: 1.0000 | MEDICATED_PATCH | TRANSDERMAL | 0 refills | Status: DC

## 2023-06-20 MED ORDER — FAMOTIDINE IN NACL 20-0.9 MG/50ML-% IV SOLN
20.0000 mg | Freq: Once | INTRAVENOUS | Status: AC
  Administered 2023-06-20: 20 mg via INTRAVENOUS
  Filled 2023-06-20: qty 50

## 2023-06-20 MED ORDER — METOCLOPRAMIDE HCL 5 MG/ML IJ SOLN
20.0000 mg | Freq: Once | INTRAVENOUS | Status: AC
  Administered 2023-06-20: 20 mg via INTRAVENOUS
  Filled 2023-06-20: qty 4

## 2023-06-20 MED ORDER — ONDANSETRON HCL 4 MG PO TABS: 4.0000 mg | ORAL_TABLET | Freq: Three times a day (TID) | ORAL | Status: DC | PRN

## 2023-06-20 MED ORDER — ONDANSETRON HCL 4 MG/2ML IJ SOLN
4.0000 mg | Freq: Once | INTRAMUSCULAR | Status: AC
  Administered 2023-06-20: 4 mg via INTRAVENOUS
  Filled 2023-06-20: qty 2

## 2023-06-20 MED ORDER — PANTOPRAZOLE SODIUM 40 MG PO TBEC: 40.0000 mg | DELAYED_RELEASE_TABLET | Freq: Every day | ORAL | 0 refills | Status: DC

## 2023-06-20 MED ORDER — DICLEGIS 10-10 MG PO TBEC: 1.0000 | DELAYED_RELEASE_TABLET | Freq: Two times a day (BID) | ORAL | 0 refills | Status: DC

## 2023-06-20 NOTE — Telephone Encounter (Signed)
After hours call:  Patient's mother called line stating she would like to know next steps for patient's severe nausea/vomiting of pregnancy. Patient called her mother from her work stating she had called 911 to take her to the ED since she is having such persistent symptoms. Advised that I agree with further in-person evaluation and that the clinician who sees her could likely help with further therapy. Patient's mother agreeable and will express her concerns at the hospital when she meets her daughter there.  Given patient's symptoms are refractory to multiple treatments, can consider diphenhydramine, metoclopramide, prochlorperazine, and even glucocorticoids in the future.

## 2023-06-20 NOTE — MAU Note (Addendum)
Sharon Barnett is a 32 y.o. at [redacted]w[redacted]d here in MAU reporting: has N/V and unable to keep anything down.  Reports vomiting is nonstop.  Reports she's too weak to talk or walk.  Denies VB, endorses lower  abdominal cramping. LMP: NA Onset of complaint: 2 weeks ago Pain score: 9 Vitals:   06/20/23 1523  BP: 132/88  Pulse: 82  Resp: 18  Temp: 99.2 F (37.3 C)  SpO2: 100%     FHT:NA Lab orders placed from triage:   UA

## 2023-06-20 NOTE — MAU Provider Note (Signed)
Chief Complaint: Nausea and Emesis   Event Date/Time   First Provider Initiated Contact with Patient 06/20/23 1635      SUBJECTIVE HPI: Sharon Barnett is a 32 y.o. G3P0011 at [redacted]w[redacted]d by LMP consistent with early ultrasound who presents to maternity admissions reporting persistent N/V.  Patient has been seen in MAU multiple times for same issue. Notes that she was unable to pick up prescriptions from 10/13 as her pharmacy was out of the medications. She only has phenergan at home and took this last night. Feels weak and tired. Significant spitting up/gagging.  Denies urinary symptoms, VB, LOF, vaginal discharge/odor, fever/chills, congestion, sick contacts.  HPI  Past Medical History:  Diagnosis Date   ADHD (attention deficit hyperactivity disorder)    Boils    Complication of anesthesia    Hypersensitive to anesthesia (wisdom teeth)   Headache    Hyperemesis gravidarum    Vaginal Pap smear, abnormal    Past Surgical History:  Procedure Laterality Date   OTHER SURGICAL HISTORY     elective abortion   WISDOM TOOTH EXTRACTION     Social History   Socioeconomic History   Marital status: Significant Other    Spouse name: Not on file   Number of children: Not on file   Years of education: Not on file   Highest education level: GED or equivalent  Occupational History   Not on file  Tobacco Use   Smoking status: Never   Smokeless tobacco: Never  Vaping Use   Vaping status: Never Used  Substance and Sexual Activity   Alcohol use: No   Drug use: No   Sexual activity: Not Currently    Birth control/protection: None  Other Topics Concern   Not on file  Social History Narrative   Not on file   Social Determinants of Health   Financial Resource Strain: Low Risk  (05/29/2023)   Overall Financial Resource Strain (CARDIA)    Difficulty of Paying Living Expenses: Not hard at all  Food Insecurity: No Food Insecurity (05/29/2023)   Hunger Vital Sign    Worried About Running  Out of Food in the Last Year: Never true    Ran Out of Food in the Last Year: Never true  Transportation Needs: No Transportation Needs (05/29/2023)   PRAPARE - Administrator, Civil Service (Medical): No    Lack of Transportation (Non-Medical): No  Physical Activity: Unknown (05/29/2023)   Exercise Vital Sign    Days of Exercise per Week: 0 days    Minutes of Exercise per Session: Not on file  Stress: No Stress Concern Present (05/29/2023)   [employer] of Occupational Health - Occupational Stress Questionnaire    Feeling of Stress : Only a little  Social Connections: Socially Isolated (05/29/2023)   Social Connection and Isolation Panel [NHANES]    Frequency of Communication with Friends and Family: More than three times a week    Frequency of Social Gatherings with Friends and Family: Once a week    Attends Religious Services: Never    Database administrator or Organizations: No    Attends Engineer, structural: Not on file    Marital Status: Never married  Intimate Partner Violence: Not on file   No current facility-administered medications on file prior to encounter.   Current Outpatient Medications on File Prior to Encounter  Medication Sig Dispense Refill   [DISCONTINUED] zolpidem (AMBIEN CR) 6.25 MG CR tablet Take 1 tablet (6.25 mg total) by  mouth at bedtime as needed for sleep. (Patient not taking: Reported on 02/04/2019) 15 tablet 0   No Known Allergies  ROS:  Pertinent positives/negatives listed above.  I have reviewed patient's Past Medical Hx, Surgical Hx, Family Hx, Social Hx, medications and allergies.   Physical Exam  Patient Vitals for the past 24 hrs:  BP Temp Temp src Pulse Resp SpO2  06/20/23 1946 127/79 -- -- 90 -- --  06/20/23 1815 -- -- -- -- 16 --  06/20/23 1613 115/69 -- -- 74 16 98 %  06/20/23 1523 132/88 99.2 F (37.3 C) Oral 82 18 100 %   Constitutional: Well-developed, well-nourished female sleepy. Minimally conversant but  able to talk logically Cardiovascular: normal rate Respiratory: normal effort GI: Abd soft, non-tender. Pos BS x 4 MS: Extremities nontender, no edema, normal ROM Neurologic: Alert and oriented x 4  LAB RESULTS Results for orders placed or performed during the hospital encounter of 06/20/23 (from the past 24 hour(s))  Urinalysis, Routine w reflex microscopic -Urine, Clean Catch     Status: Abnormal   Collection Time: 06/20/23  3:55 PM  Result Value Ref Range   Color, Urine AMBER (A) YELLOW   APPearance HAZY (A) CLEAR   Specific Gravity, Urine 1.033 (H) 1.005 - 1.030   pH 5.0 5.0 - 8.0   Glucose, UA NEGATIVE NEGATIVE mg/dL   Hgb urine dipstick NEGATIVE NEGATIVE   Bilirubin Urine NEGATIVE NEGATIVE   Ketones, ur 80 (A) NEGATIVE mg/dL   Protein, ur 30 (A) NEGATIVE mg/dL   Nitrite NEGATIVE NEGATIVE   Leukocytes,Ua MODERATE (A) NEGATIVE   RBC / HPF 0-5 0 - 5 RBC/hpf   WBC, UA 6-10 0 - 5 WBC/hpf   Bacteria, UA FEW (A) NONE SEEN   Squamous Epithelial / HPF 11-20 0 - 5 /HPF   Mucus PRESENT        IMAGING US PELVIC COMPLETE WITH TRANSVAGINAL  Result Date: 06/11/2023 CLINICAL DATA:  Pelvic pain EXAM: OBSTETRIC <14 WK [place] AND TRANSVAGINAL OB TECHNIQUE: Both transabdominal and transvaginal ultrasound examinations were performed for complete evaluation of the gestation as well as the maternal uterus, adnexal regions, and pelvic cul-de-sac. Transvaginal technique was performed to assess early pregnancy. COMPARISON:  Pelvic ultrasound dated June 04, 2023. FINDINGS: Intrauterine gestational sac: Single Yolk sac:  Not Visualized. Embryo:  Not Visualized. Cardiac Activity: Not Visualized. MSD: 11.4 mm   5 w   6 d Subchorionic hemorrhage: A moderate-sized subchorionic hemorrhage is noted measuring 3.3 x 1.8 x 1.1 cm. Maternal uterus/adnexae: The uterus is anteverted. A large uterine fibroid is again noted measuring 5.9 x 5.3 x 4.6 cm. Normal appearance of the ovaries with a corpus luteal cyst  again noted on the left. There is a small amount of free fluid in the pelvis. IMPRESSION: 1. Moderate-sized subchorionic hemorrhage. 2. A single intrauterine gestational sac is identified measuring 5 weeks and 6 days by mean sac diameter. No yolk sac or embryo is visualized on today's examination. Follow-up ultrasound examination is recommended in 10-14 days for further evaluation. 3. Large uterine fibroid. 4. Normal sonographic appearance of the ovaries. Electronically Signed   By: Hart Robinsons M.D.   On: 06/11/2023 11:16   US OB LESS THAN 14 WEEKS WITH OB TRANSVAGINAL  Result Date: 06/04/2023 CLINICAL DATA:  Abdominal pain. Positive pregnancy test. History of fibroids. Quantitative beta HCG is 3,228. Unknown LMP. EXAM: OBSTETRIC <14 WK [place] AND TRANSVAGINAL OB US TECHNIQUE: Both transabdominal and transvaginal ultrasound examinations were performed for complete  evaluation of the gestation as well as the maternal uterus, adnexal regions, and pelvic cul-de-sac. Transvaginal technique was performed to assess early pregnancy. COMPARISON:  Ultrasound pelvis 04/18/2023 FINDINGS: Intrauterine gestational sac: A possible early intrauterine gestational sac is identified intermittently in the examination. Yolk sac:  Not Visualized. Embryo:  Not Visualized. Cardiac Activity: Not Visualized. MSD: Unable to measure. Subchorionic hemorrhage:  None visualized. Maternal uterus/adnexae: Uterus is anteverted. A large uterine fibroid is present measuring 5.1 x 4 x 5.1 cm. Both ovaries are visualized and appear normal. Corpus luteal cyst noted on the left. Small amount of free fluid in the pelvis. IMPRESSION: 1. Although a possible early intrauterine sac is intermittently identified in the examination, no definite intrauterine gestational sac, yolk sac, or fetal pole identified. Differential considerations include intrauterine pregnancy too early to be sonographically visualized, missed abortion, or ectopic pregnancy. Followup  ultrasound is recommended in 10-14 days for further evaluation. 2. Normal ultrasound appearance of the ovaries. 3. Large uterine fibroid. Electronically Signed   By: Burman Nieves M.D.   On: 06/04/2023 02:16    MAU Management/MDM: Orders Placed This Encounter  Procedures   Urinalysis, Routine w reflex microscopic -Urine, Clean Catch   Discharge patient    Meds ordered this encounter  Medications   DISCONTD: ondansetron (ZOFRAN-ODT) disintegrating tablet 8 mg   scopolamine (TRANSDERM-SCOP) 1 MG/3DAYS 1.5 mg   lactated ringers bolus 1,000 mL   ondansetron (ZOFRAN) injection 4 mg   metoCLOPramide (REGLAN) 20 mg in dextrose 5 % 50 mL IVPB   famotidine (PEPCID) IVPB 20 mg premix   glycopyrrolate (ROBINUL) injection 0.2 mg   predniSONE (DELTASONE) 20 MG tablet    Sig: Take 2 tablets (40 mg total) by mouth daily for 10 days.    Dispense:  20 tablet    Refill:  0   Doxylamine-Pyridoxine (DICLEGIS) 10-10 MG TBEC    Sig: Take 1 tablet by mouth 2 (two) times daily.    Dispense:  60 tablet    Refill:  0   ondansetron (ZOFRAN) 4 MG tablet    Sig: Take 1 tablet (4 mg total) by mouth every 8 (eight) hours as needed for nausea or vomiting.   pantoprazole (PROTONIX) 40 MG tablet    Sig: Take 1 tablet (40 mg total) by mouth daily.    Dispense:  30 tablet    Refill:  0   scopolamine (TRANSDERM-SCOP) 1 MG/3DAYS    Sig: Place 1 patch (1.5 mg total) onto the skin every 3 (three) days.    Dispense:  10 patch    Refill:  0   promethazine (PHENERGAN) 25 MG tablet    Sig: Take 1 tablet (25 mg total) by mouth every 6 (six) hours as needed for nausea or vomiting.    Dispense:  12 tablet    Refill:  0    Discussed with patient upon assessment that we can try Zofran ODT, scopolamine patch, and PO hydration. She was amenable to this. However, she immediately spit up the Zofran ODT. Decision was made at that time to obtain IV access. Gave LR bolus, zofran, reglan, famotidine, glycopyrrolate, and  scopolamine patch.   Patient felt much improved with these interventions and requested to be discharged after eating a snack. I sent prescriptions to [facility] pharmacy and her ride verbalized they could pick them up on her way home.  ASSESSMENT 1. Hyperemesis gravidarum   2. [redacted] weeks gestation of pregnancy     PLAN Discharge home with strict return precautions. Allergies  as of 06/20/2023   No Known Allergies      Medication List     STOP taking these medications    docusate sodium 100 MG capsule Commonly known as: COLACE   ondansetron 4 MG disintegrating tablet Commonly known as: ZOFRAN-ODT   potassium chloride SA 20 MEQ tablet Commonly known as: KLOR-CON M       TAKE these medications    Diclegis 10-10 MG Tbec Generic drug: Doxylamine-Pyridoxine Take 1 tablet by mouth 2 (two) times daily. What changed: when to take this   ondansetron 4 MG tablet Commonly known as: ZOFRAN Take 1 tablet (4 mg total) by mouth every 8 (eight) hours as needed for nausea or vomiting.   pantoprazole 40 MG tablet Commonly known as: Protonix Take 1 tablet (40 mg total) by mouth daily.   predniSONE 20 MG tablet Commonly known as: DELTASONE Take 2 tablets (40 mg total) by mouth daily for 10 days.   promethazine 25 MG tablet Commonly known as: PHENERGAN Take 1 tablet (25 mg total) by mouth every 6 (six) hours as needed for nausea or vomiting.   scopolamine 1 MG/3DAYS Commonly known as: Transderm-Scop Place 1 patch (1.5 mg total) onto the skin every 3 (three) days.         Wylene Simmer, MD OB Fellow 06/20/2023  9:23 PM

## 2023-06-22 ENCOUNTER — Other Ambulatory Visit: Payer: Self-pay | Admitting: *Deleted

## 2023-06-22 DIAGNOSIS — O3680X0 Pregnancy with inconclusive fetal viability, not applicable or unspecified: Secondary | ICD-10-CM

## 2023-06-25 ENCOUNTER — Telehealth: Payer: Self-pay | Admitting: Student

## 2023-06-25 ENCOUNTER — Other Ambulatory Visit: Payer: Medicaid Other

## 2023-06-25 ENCOUNTER — Inpatient Hospital Stay (HOSPITAL_COMMUNITY): Payer: Medicaid Other

## 2023-06-25 ENCOUNTER — Inpatient Hospital Stay (HOSPITAL_COMMUNITY)
Admission: AD | Admit: 2023-06-25 | Discharge: 2023-06-26 | Disposition: A | Discharge status: Home / Self Care | Payer: Medicaid Other | Attending: Obstetrics and Gynecology | Admitting: Obstetrics and Gynecology

## 2023-06-25 DIAGNOSIS — O26891 Other specified pregnancy related conditions, first trimester: Secondary | ICD-10-CM | POA: Diagnosis present

## 2023-06-25 DIAGNOSIS — D259 Leiomyoma of uterus, unspecified: Secondary | ICD-10-CM | POA: Diagnosis not present

## 2023-06-25 DIAGNOSIS — O208 Other hemorrhage in early pregnancy: Secondary | ICD-10-CM | POA: Diagnosis not present

## 2023-06-25 DIAGNOSIS — O3411 Maternal care for benign tumor of corpus uteri, first trimester: Secondary | ICD-10-CM | POA: Diagnosis not present

## 2023-06-25 DIAGNOSIS — O3680X0 Pregnancy with inconclusive fetal viability, not applicable or unspecified: Secondary | ICD-10-CM | POA: Insufficient documentation

## 2023-06-25 DIAGNOSIS — R1031 Right lower quadrant pain: Secondary | ICD-10-CM | POA: Insufficient documentation

## 2023-06-25 DIAGNOSIS — O210 Mild hyperemesis gravidarum: Secondary | ICD-10-CM | POA: Insufficient documentation

## 2023-06-25 DIAGNOSIS — Z3A08 8 weeks gestation of pregnancy: Secondary | ICD-10-CM | POA: Diagnosis not present

## 2023-06-25 DIAGNOSIS — O99011 Anemia complicating pregnancy, first trimester: Secondary | ICD-10-CM | POA: Insufficient documentation

## 2023-06-25 DIAGNOSIS — R110 Nausea: Secondary | ICD-10-CM | POA: Diagnosis not present

## 2023-06-25 DIAGNOSIS — R112 Nausea with vomiting, unspecified: Secondary | ICD-10-CM | POA: Diagnosis not present

## 2023-06-25 LAB — CBC
HCT: 31.9 % — ABNORMAL LOW (ref 36.0–46.0)
Hemoglobin: 10.8 g/dL — ABNORMAL LOW (ref 12.0–15.0)
MCH: 28.6 pg (ref 26.0–34.0)
MCHC: 33.9 g/dL (ref 30.0–36.0)
MCV: 84.4 fL (ref 80.0–100.0)
Platelets: 354 10*3/uL (ref 150–400)
RBC: 3.78 MIL/uL — ABNORMAL LOW (ref 3.87–5.11)
RDW: 12.7 % (ref 11.5–15.5)
WBC: 8.2 10*3/uL (ref 4.0–10.5)
nRBC: 0 % (ref 0.0–0.2)

## 2023-06-25 LAB — COMPREHENSIVE METABOLIC PANEL
ALT: 14 U/L (ref 0–44)
AST: 16 U/L (ref 15–41)
Albumin: 3.4 g/dL — ABNORMAL LOW (ref 3.5–5.0)
Alkaline Phosphatase: 41 U/L (ref 38–126)
Anion gap: 13 (ref 5–15)
BUN: 9 mg/dL (ref 6–20)
CO2: 20 mmol/L — ABNORMAL LOW (ref 22–32)
Calcium: 9.5 mg/dL (ref 8.9–10.3)
Chloride: 101 mmol/L (ref 98–111)
Creatinine, Ser: 0.65 mg/dL (ref 0.44–1.00)
GFR, Estimated: >60 mL/min (ref >60)
Glucose, Bld: 76 mg/dL (ref 70–99)
Potassium: 3.4 mmol/L — ABNORMAL LOW (ref 3.5–5.1)
Sodium: 134 mmol/L — ABNORMAL LOW (ref 135–145)
Total Bilirubin: 0.8 mg/dL (ref 0.3–1.2)
Total Protein: 6.8 g/dL (ref 6.5–8.1)

## 2023-06-25 LAB — URINALYSIS, ROUTINE W REFLEX MICROSCOPIC
Bilirubin Urine: NEGATIVE
Glucose, UA: NEGATIVE mg/dL
Hgb urine dipstick: NEGATIVE
Ketones, ur: 80 mg/dL — AB
Nitrite: NEGATIVE
Protein, ur: 30 mg/dL — AB
Specific Gravity, Urine: 1.031 — ABNORMAL HIGH (ref 1.005–1.030)
pH: 6 (ref 5.0–8.0)

## 2023-06-25 LAB — ABO/RH: ABO/RH(D): A POS

## 2023-06-25 MED ORDER — METOCLOPRAMIDE HCL 5 MG/ML IJ SOLN
10.0000 mg | Freq: Once | INTRAMUSCULAR | Status: AC
  Administered 2023-06-25: 10 mg via INTRAVENOUS
  Filled 2023-06-25: qty 2

## 2023-06-25 MED ORDER — GLYCOPYRROLATE 0.2 MG/ML IJ SOLN
0.2000 mg | Freq: Four times a day (QID) | INTRAMUSCULAR | Status: DC | PRN
  Administered 2023-06-25: 0.2 mg via INTRAVENOUS
  Filled 2023-06-25: qty 1

## 2023-06-25 MED ORDER — ONDANSETRON HCL 4 MG/2ML IJ SOLN
4.0000 mg | Freq: Once | INTRAMUSCULAR | Status: AC
  Administered 2023-06-25: 4 mg via INTRAVENOUS
  Filled 2023-06-25: qty 2

## 2023-06-25 MED ORDER — FAMOTIDINE IN NACL 20-0.9 MG/50ML-% IV SOLN
20.0000 mg | Freq: Once | INTRAVENOUS | Status: AC
  Administered 2023-06-25: 20 mg via INTRAVENOUS
  Filled 2023-06-25: qty 50

## 2023-06-25 MED ORDER — LACTATED RINGERS IV BOLUS
1000.0000 mL | Freq: Once | INTRAVENOUS | Status: AC
  Administered 2023-06-25: 1000 mL via INTRAVENOUS

## 2023-06-25 NOTE — Telephone Encounter (Signed)
Patient and her mother called after-hours line due to intractable vomiting today.  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  She is currently taking a scopolamine patch, this is in place.  She has Phenergan, Zofran and Diclegis.  She was unable to keep down Phenergan or Diclegis.  Zofran has not helped her vomiting.  She has not been able to eat nor drink today.  Given her current intractable vomiting despite home therapy, I do recommend she is evaluated at MAU.  It appears that she responds well to IV treatment in the MAU.  Additionally have scheduled her for an appointment in our clinic tomorrow, unclear who she is seeing for prenatal care at this time.  Question utility of mirtazapine as an outpatient. Patient will need counseling of risk and benefit.  However, given her significant intractable vomiting anticipate benefits outweigh risk.   Tiffany Kocher DO  Family Medicine PGY-2

## 2023-06-25 NOTE — MAU Provider Note (Signed)
Chief Complaint: Abdominal Pain, Nausea, and Emesis  SUBJECTIVE HPI: Sharon Barnett is a 32 y.o. G3P0011 at [redacted]w[redacted]d by LMP who presents to maternity admissions reporting unable to keep down fluids or food despite prescribed medications starting Saturday. She has not been able to pick up the prednisone. She also endorses RLQ pain that comes and goes, feels sharp. She feels weak and struggles to get around. Minimally conversant, but alert.  She denies vaginal bleeding, vaginal itching/burning, urinary symptoms, h/a, dizziness, n/v, or fever/chills.    HPI  Past Medical History:  Diagnosis Date   ADHD (attention deficit hyperactivity disorder)    Boils    Complication of anesthesia    Hypersensitive to anesthesia (wisdom teeth)   Headache    Hyperemesis gravidarum    Vaginal Pap smear, abnormal    Past Surgical History:  Procedure Laterality Date   OTHER SURGICAL HISTORY     elective abortion   WISDOM TOOTH EXTRACTION     Social History   Socioeconomic History   Marital status: Significant Other    Spouse name: Not on file   Number of children: Not on file   Years of education: Not on file   Highest education level: GED or equivalent  Occupational History   Not on file  Tobacco Use   Smoking status: Never   Smokeless tobacco: Never  Vaping Use   Vaping status: Never Used  Substance and Sexual Activity   Alcohol use: No   Drug use: No   Sexual activity: Not Currently    Birth control/protection: None  Other Topics Concern   Not on file  Social History Narrative   Not on file   Social Determinants of Health   Financial Resource Strain: Low Risk  (05/29/2023)   Overall Financial Resource Strain (CARDIA)    Difficulty of Paying Living Expenses: Not hard at all  Food Insecurity: No Food Insecurity (05/29/2023)   Hunger Vital Sign    Worried About Running Out of Food in the Last Year: Never true    Ran Out of Food in the Last Year: Never true  Transportation Needs: No  Transportation Needs (05/29/2023)   PRAPARE - Administrator, Civil Service (Medical): No    Lack of Transportation (Non-Medical): No  Physical Activity: Unknown (05/29/2023)   Exercise Vital Sign    Days of Exercise per Week: 0 days    Minutes of Exercise per Session: Not on file  Stress: No Stress Concern Present (05/29/2023)   [employer] of Occupational Health - Occupational Stress Questionnaire    Feeling of Stress : Only a little  Social Connections: Socially Isolated (05/29/2023)   Social Connection and Isolation Panel [NHANES]    Frequency of Communication with Friends and Family: More than three times a week    Frequency of Social Gatherings with Friends and Family: Once a week    Attends Religious Services: Never    Database administrator or Organizations: No    Attends Engineer, structural: Not on file    Marital Status: Never married  Intimate Partner Violence: Not on file   No current facility-administered medications on file prior to encounter.   Current Outpatient Medications on File Prior to Encounter  Medication Sig Dispense Refill   promethazine (PHENERGAN) 25 MG tablet Take 1 tablet (25 mg total) by mouth every 6 (six) hours as needed for nausea or vomiting. 12 tablet 0   scopolamine (TRANSDERM-SCOP) 1 MG/3DAYS Place 1 patch (1.5 mg  total) onto the skin every 3 (three) days. 10 patch 0   Doxylamine-Pyridoxine (DICLEGIS) 10-10 MG TBEC Take 1 tablet by mouth 2 (two) times daily. 60 tablet 0   ondansetron (ZOFRAN) 4 MG tablet Take 1 tablet (4 mg total) by mouth every 8 (eight) hours as needed for nausea or vomiting.     pantoprazole (PROTONIX) 40 MG tablet Take 1 tablet (40 mg total) by mouth daily. 30 tablet 0   predniSONE (DELTASONE) 20 MG tablet Take 2 tablets (40 mg total) by mouth daily for 10 days. 20 tablet 0   [DISCONTINUED] zolpidem (AMBIEN CR) 6.25 MG CR tablet Take 1 tablet (6.25 mg total) by mouth at bedtime as needed for sleep.  (Patient not taking: Reported on 02/04/2019) 15 tablet 0   No Known Allergies  I have reviewed patient's Past Medical Hx, Surgical Hx, Family Hx, Social Hx, medications and allergies.   ROS:  Review of Systems Review of Systems  Other systems negative   Physical Exam  Physical Exam Patient Vitals for the past 24 hrs:  BP Temp Temp src Pulse Resp SpO2 Height Weight  06/26/23 0027 (!) 110/59 -- -- (!) 58 -- -- -- --  06/25/23 2254 (!) 99/48 -- -- 76 -- -- -- --  06/25/23 2051 123/73 -- -- 64 -- -- -- --  06/25/23 2049 123/73 99.9 F (37.7 C) Oral 74 19 100 % 5\' 8"  (1.727 m) 54.7 kg   Constitutional: Well-developed, well-nourished female in moderate acute distress(vomiting, on arrival).  Cardiovascular: normal rate Respiratory: normal effort GI: Abd soft, RLQ tenderness. Pos BS x 4 MS: Extremities nontender, no edema, normal ROM Neurologic: Alert and oriented x 4.  GU: Neg CVAT.  LAB RESULTS Results for orders placed or performed during the hospital encounter of 06/25/23 (from the past 24 hour(s))  Urinalysis, Routine w reflex microscopic -Urine, Clean Catch     Status: Abnormal   Collection Time: 06/25/23  8:06 PM  Result Value Ref Range   Color, Urine YELLOW YELLOW   APPearance HAZY (A) CLEAR   Specific Gravity, Urine 1.031 (H) 1.005 - 1.030   pH 6.0 5.0 - 8.0   Glucose, UA NEGATIVE NEGATIVE mg/dL   Hgb urine dipstick NEGATIVE NEGATIVE   Bilirubin Urine NEGATIVE NEGATIVE   Ketones, ur 80 (A) NEGATIVE mg/dL   Protein, ur 30 (A) NEGATIVE mg/dL   Nitrite NEGATIVE NEGATIVE   Leukocytes,Ua MODERATE (A) NEGATIVE   RBC / HPF 0-5 0 - 5 RBC/hpf   WBC, UA 6-10 0 - 5 WBC/hpf   Bacteria, UA FEW (A) NONE SEEN   Squamous Epithelial / HPF 11-20 0 - 5 /HPF   Mucus PRESENT    Hyaline Casts, UA PRESENT   Comprehensive metabolic panel     Status: Abnormal   Collection Time: 06/25/23  9:56 PM  Result Value Ref Range   Sodium 134 (L) 135 - 145 mmol/L   Potassium 3.4 (L) 3.5 - 5.1  mmol/L   Chloride 101 98 - 111 mmol/L   CO2 20 (L) 22 - 32 mmol/L   Glucose, Bld 76 70 - 99 mg/dL   BUN 9 6 - 20 mg/dL   Creatinine, Ser 2.59 0.44 - 1.00 mg/dL   Calcium 9.5 8.9 - 56.3 mg/dL   Total Protein 6.8 6.5 - 8.1 g/dL   Albumin 3.4 (L) 3.5 - 5.0 g/dL   AST 16 15 - 41 U/L   ALT 14 0 - 44 U/L   Alkaline Phosphatase 41 38 -  126 U/L   Total Bilirubin 0.8 0.3 - 1.2 mg/dL   GFR, Estimated >16 >10 mL/min   Anion gap 13 5 - 15  CBC     Status: Abnormal   Collection Time: 06/25/23  9:56 PM  Result Value Ref Range   WBC 8.2 4.0 - 10.5 K/uL   RBC 3.78 (L) 3.87 - 5.11 MIL/uL   Hemoglobin 10.8 (L) 12.0 - 15.0 g/dL   HCT 96.0 (L) 45.4 - 09.8 %   MCV 84.4 80.0 - 100.0 fL   MCH 28.6 26.0 - 34.0 pg   MCHC 33.9 30.0 - 36.0 g/dL   RDW 11.9 14.7 - 82.9 %   Platelets 354 150 - 400 K/uL   nRBC 0.0 0.0 - 0.2 %  hCG, quantitative, pregnancy     Status: Abnormal   Collection Time: 06/25/23  9:56 PM  Result Value Ref Range   hCG, Beta Chain, Quant, S >250,000 (H) <5 mIU/mL  ABO/Rh     Status: None   Collection Time: 06/25/23  9:56 PM  Result Value Ref Range   ABO/RH(D) A POS    No rh immune globuloin      NOT A RH IMMUNE GLOBULIN CANDIDATE, PT RH POSITIVE Performed at [facility], [address], [place], [place] [address]    --/--/A POS (10/21 2156) IMAGING US OB Comp Less 14 Wks  Result Date: 06/25/2023 CLINICAL DATA:  Pregnancy of unknown location. EXAM: OBSTETRIC <14 WK ULTRASOUND TECHNIQUE: Transabdominal ultrasound was performed for evaluation of the gestation as well as the maternal uterus and adnexal regions. COMPARISON:  Ultrasound 06/11/2023 FINDINGS: Intrauterine gestational sac: Single Yolk sac:  Visualized. Embryo:  Visualized. Cardiac Activity: Visualized. Heart Rate: Not discretely measured, but fetal cardiac activity demonstrated on cine clips. CRL:   12.3 mm   7 w 3 d                  [place] EDC: 02/08/2024 Subchorionic hemorrhage: Moderate subchorionic hemorrhage  again seen, proximally 2.3 x 1.1 cm. Maternal uterus/adnexae: Large posterior fibroid measuring 6.1 x 5 x 5.4 cm. Both ovaries are visualized and are normal, ovarian blood flow is seen. No adnexal mass or pelvic free fluid. IMPRESSION: 1. Single live intrauterine pregnancy estimated gestational age [redacted] weeks 3 days for ultrasound [facility] 02/08/2024. 2. Moderate subchorionic hemorrhage again seen. 3. Uterine fibroid. Electronically Signed   By: Narda Rutherford M.D.   On: 06/25/2023 23:23   US PELVIC COMPLETE WITH TRANSVAGINAL  Result Date: 06/11/2023 CLINICAL DATA:  Pelvic pain EXAM: OBSTETRIC <14 WK [place] AND TRANSVAGINAL OB TECHNIQUE: Both transabdominal and transvaginal ultrasound examinations were performed for complete evaluation of the gestation as well as the maternal uterus, adnexal regions, and pelvic cul-de-sac. Transvaginal technique was performed to assess early pregnancy. COMPARISON:  Pelvic ultrasound dated June 04, 2023. FINDINGS: Intrauterine gestational sac: Single Yolk sac:  Not Visualized. Embryo:  Not Visualized. Cardiac Activity: Not Visualized. MSD: 11.4 mm   5 w   6 d Subchorionic hemorrhage: A moderate-sized subchorionic hemorrhage is noted measuring 3.3 x 1.8 x 1.1 cm. Maternal uterus/adnexae: The uterus is anteverted. A large uterine fibroid is again noted measuring 5.9 x 5.3 x 4.6 cm. Normal appearance of the ovaries with a corpus luteal cyst again noted on the left. There is a small amount of free fluid in the pelvis. IMPRESSION: 1. Moderate-sized subchorionic hemorrhage. 2. A single intrauterine gestational sac is identified measuring 5 weeks and 6 days by mean sac diameter. No  yolk sac or embryo is visualized on today's examination. Follow-up ultrasound examination is recommended in 10-14 days for further evaluation. 3. Large uterine fibroid. 4. Normal sonographic appearance of the ovaries. Electronically Signed   By: Hart Robinsons M.D.   On: 06/11/2023 11:16   US OB LESS THAN 14  WEEKS WITH OB TRANSVAGINAL  Result Date: 06/04/2023 CLINICAL DATA:  Abdominal pain. Positive pregnancy test. History of fibroids. Quantitative beta HCG is 3,228. Unknown LMP. EXAM: OBSTETRIC <14 WK [place] AND TRANSVAGINAL OB US TECHNIQUE: Both transabdominal and transvaginal ultrasound examinations were performed for complete evaluation of the gestation as well as the maternal uterus, adnexal regions, and pelvic cul-de-sac. Transvaginal technique was performed to assess early pregnancy. COMPARISON:  Ultrasound pelvis 04/18/2023 FINDINGS: Intrauterine gestational sac: A possible early intrauterine gestational sac is identified intermittently in the examination. Yolk sac:  Not Visualized. Embryo:  Not Visualized. Cardiac Activity: Not Visualized. MSD: Unable to measure. Subchorionic hemorrhage:  None visualized. Maternal uterus/adnexae: Uterus is anteverted. A large uterine fibroid is present measuring 5.1 x 4 x 5.1 cm. Both ovaries are visualized and appear normal. Corpus luteal cyst noted on the left. Small amount of free fluid in the pelvis. IMPRESSION: 1. Although a possible early intrauterine sac is intermittently identified in the examination, no definite intrauterine gestational sac, yolk sac, or fetal pole identified. Differential considerations include intrauterine pregnancy too early to be sonographically visualized, missed abortion, or ectopic pregnancy. Followup ultrasound is recommended in 10-14 days for further evaluation. 2. Normal ultrasound appearance of the ovaries. 3. Large uterine fibroid. Electronically Signed   By: Burman Nieves M.D.   On: 06/04/2023 02:16    MAU Management/MDM: I have reviewed the triage vital signs and the nursing notes.   Pertinent labs & imaging results that were available during my care of the patient were reviewed by me and considered in my medical decision making (see chart for details).      I have reviewed her medical records including past results, notes and  treatments. Medical, Surgical, and family history were reviewed.  Medications and recent lab tests were reviewed  Discussed with patient upon assessment that we can try Zofran ODT, scopolamine patch, and PO hydration. However, this did not work last week so the decision was made  to obtain IV access. Gave LR bolus, zofran, reglan, famotidine, and glycopyrrolate.  Treatments in MAU included as ordered above.   ASSESSMENT 1. [redacted] weeks gestation of pregnancy   2. Hyperemesis affecting pregnancy, antepartum   3. Right lower quadrant abdominal pain   Symptoms resolved and patient requesting to go home.  Labs appropriate Mild anemia K 3.4 2/2 emesis provided LR bolus UA consistent with dehydration and reportedly poor oral intake - tolerating PO at discharge  PLAN Discharge home   Follow-up Information     [facility] Follow up.   Specialty: Family Medicine Why: Continue with appointment as scheduled 06/26/23 Contact information: [address] [place] [address] [phone]              Pt stable at time of discharge. Encouraged to return here if she develops worsening of symptoms, fever, or other concerning symptoms.   Wyn Forster, MD FMOB Fellow, Faculty practice [facility], Center for [facility]  06/26/2023  1:01 AM

## 2023-06-25 NOTE — MAU Note (Signed)
Called radiology reading room [place], he stated he will sent the [place] directly to radiologist and should be read in 20 minutes.

## 2023-06-25 NOTE — MAU Note (Signed)
..  Sharon Barnett is a 32 y.o. at [redacted]w[redacted]d here in MAU reporting: nausea and vomiting. Reports that her medication stopped working Saturday night and has vomited non-stop since then. Reports right sided abdominal pain that started Saturday and it comes and goes, feels sharp.  Denies vaginal bleeding.  Feels weak and struggles to get from wheelchair to bed.   Took suppository phenergan this morning, no relief  Pain score: 8.5/10 Vitals:   06/25/23 2049  BP: 123/73  Pulse: 74  Resp: 19  Temp: 99.9 F (37.7 C)  SpO2: 100%     FHT:n/a Lab orders placed from triage:  UA

## 2023-06-26 ENCOUNTER — Other Ambulatory Visit: Payer: Self-pay

## 2023-06-26 ENCOUNTER — Ambulatory Visit (INDEPENDENT_AMBULATORY_CARE_PROVIDER_SITE_OTHER): Payer: Self-pay | Admitting: Family Medicine

## 2023-06-26 VITALS — BP 119/72 | HR 95 | Temp 98.2°F | Ht 68.0 in | Wt 125.4 lb

## 2023-06-26 DIAGNOSIS — Z3A08 8 weeks gestation of pregnancy: Secondary | ICD-10-CM

## 2023-06-26 DIAGNOSIS — O210 Mild hyperemesis gravidarum: Secondary | ICD-10-CM | POA: Diagnosis not present

## 2023-06-26 DIAGNOSIS — R1031 Right lower quadrant pain: Secondary | ICD-10-CM | POA: Diagnosis not present

## 2023-06-26 LAB — HCG, QUANTITATIVE, PREGNANCY: hCG, Beta Chain, Quant, S: >250000 m[IU]/mL — ABNORMAL HIGH (ref <5)

## 2023-06-26 MED ORDER — PREPLUS 27-1 MG PO TABS: 1.0000 | ORAL_TABLET | Freq: Every day | ORAL | 13 refills | Status: DC

## 2023-06-26 NOTE — Progress Notes (Signed)
    SUBJECTIVE:   CHIEF COMPLAINT / HPI:   Hyperemesis Gravidarum  Patient is currently [redacted] weeks pregnant and has gone to the ED or MAU almost twice a week due to hyperemesis. Patient has tried many different medications including diclegis, protonix, zofran, reglan, phenergan.  She was prescribed prednisone at one time to take with phenergan but has not yet tried it.  Patient had hyperemesis with other pregnancies but says this is the worst.  She throws up throughout the day and has lost almost 20 lbs since beginning of pregnancy  Has not been taking prenatal vitamins because she cannot keep anything down.    PERTINENT  PMH / PSH: History of hyperemesis gravidarum   OBJECTIVE:   BP 119/72   Pulse 95   Temp 98.2 F (36.8 C) (Oral)   Ht 5\' 8"  (1.727 m)   Wt 125 lb 6.4 oz (56.9 kg)   LMP 04/29/2023   SpO2 100%   BMI 19.07 kg/m   General: tired and thin appearing, in no acute distress CV: mildly tachycardic though regular, radial pulses equal and palpable, no BLE edema, cap refill =2  Resp: Normal work of breathing on room air   ASSESSMENT/PLAN:   Assessment & Plan Hyperemesis gravidarum Concern for severe hyperemesis possibly needed IV tranfusions for hydration.  - Clarified regimen to patient - diclegis twice a day, protonix, prednisone and phenergan  - Made appointment to establish care with high risk OB given extent of hyperemesis and weight loss.  [redacted] weeks gestation of pregnancy Ultrasound at MAU only concerning for stable subchorionic hemorrhage and large posterior fibroid  - ordered patient prenatal vitamins      Lockie Mola, MD [facility]

## 2023-06-26 NOTE — Assessment & Plan Note (Signed)
Ultrasound at MAU only concerning for stable subchorionic hemorrhage and large posterior fibroid  - ordered patient prenatal vitamins

## 2023-06-26 NOTE — MAU Provider Note (Signed)
History     CSN: [id number]  Arrival date and time: 06/17/23 2123   None     Chief Complaint  Patient presents with   Emesis During Pregnancy   HPI  {GYN/OB WU:[id number]}  Past Medical History:  Diagnosis Date   ADHD (attention deficit hyperactivity disorder)    Boils    Complication of anesthesia    Hypersensitive to anesthesia (wisdom teeth)   Headache    Hyperemesis gravidarum    Vaginal Pap smear, abnormal     Past Surgical History:  Procedure Laterality Date   OTHER SURGICAL HISTORY     elective abortion   WISDOM TOOTH EXTRACTION      Family History  Problem Relation Age of Onset   Depression Mother    Diabetes Mother    Hypertension Mother    Miscarriages / [place] Mother    Asthma Sister    Depression Sister    Hearing loss Sister    Birth defects Sister    Hyperlipidemia Sister    Hypertension Sister    Miscarriages / Stillbirths Sister    Asthma Maternal Grandmother    Cancer Maternal Grandmother    Diabetes Maternal Grandmother    Hypertension Maternal Grandmother    Stroke Maternal Grandmother    Vision loss Maternal Grandmother    Cancer Paternal Grandmother     Social History   Tobacco Use   Smoking status: Never   Smokeless tobacco: Never  Vaping Use   Vaping status: Never Used  Substance Use Topics   Alcohol use: No   Drug use: No    Allergies: No Known Allergies  Medications Prior to Admission  Medication Sig Dispense Refill Last Dose   Doxylamine-Pyridoxine 10-10 MG TBEC Take 1 tablet by mouth at bedtime. (Patient not taking: Reported on 06/14/2023) 30 tablet 0    ondansetron (ZOFRAN-ODT) 4 MG disintegrating tablet Take 4 mg by mouth every 8 (eight) hours as needed for nausea or vomiting.      potassium chloride SA (KLOR-CON M) 20 MEQ tablet Take 1 tablet (20 mEq total) by mouth once for 1 dose. 5 tablet 0    predniSONE (DELTASONE) 20 MG tablet Take 2 tablets (40 mg total) by mouth daily for 10 days. 20 tablet 0     promethazine (PHENERGAN) 25 MG tablet Take 1 tablet (25 mg total) by mouth every 6 (six) hours as needed for nausea or vomiting. 12 tablet 0     Review of Systems Physical Exam   Blood pressure 128/83, pulse 98, temperature 98 F (36.7 C), resp. rate 20, height 5\' 8"  (1.727 m), weight 58.1 kg, last menstrual period 04/29/2023, SpO2 100%, unknown if currently breastfeeding.  Physical Exam  Fetal Assessment *** bpm, Mod Var, -Decels, +Accels Toco:   MAU Course   Results for orders placed or performed during the hospital encounter of 06/17/23 (from the past 24 hour(s))  CBC with Differential     Status: Abnormal   Collection Time: 06/17/23  9:25 PM  Result Value Ref Range   WBC 11.0 (H) 4.0 - 10.5 K/uL   RBC 4.61 3.87 - 5.11 MIL/uL   Hemoglobin 13.2 12.0 - 15.0 g/dL   HCT 78.2 95.6 - 21.3 %   MCV 84.8 80.0 - 100.0 fL   MCH 28.6 26.0 - 34.0 pg   MCHC 33.8 30.0 - 36.0 g/dL   RDW 08.6 57.8 - 46.9 %   Platelets 481 (H) 150 - 400 K/uL   nRBC 0.0 0.0 - 0.2 %  Neutrophils Relative % 68 %   Neutro Abs 7.4 1.7 - 7.7 K/uL   Lymphocytes Relative 27 %   Lymphs Abs 3.0 0.7 - 4.0 K/uL   Monocytes Relative 5 %   Monocytes Absolute 0.5 0.1 - 1.0 K/uL   Eosinophils Relative 0 %   Eosinophils Absolute 0.0 0.0 - 0.5 K/uL   Basophils Relative 0 %   Basophils Absolute 0.0 0.0 - 0.1 K/uL   Immature Granulocytes 0 %   Abs Immature Granulocytes 0.03 0.00 - 0.07 K/uL  Comprehensive metabolic panel     Status: Abnormal   Collection Time: 06/17/23  9:25 PM  Result Value Ref Range   Sodium 137 135 - 145 mmol/L   Potassium 4.0 3.5 - 5.1 mmol/L   Chloride 101 98 - 111 mmol/L   CO2 19 (L) 22 - 32 mmol/L   Glucose, Bld 102 (H) 70 - 99 mg/dL   BUN 12 6 - 20 mg/dL   Creatinine, Ser 4.09 0.44 - 1.00 mg/dL   Calcium 81.1 8.9 - 91.4 mg/dL   Total Protein 8.0 6.5 - 8.1 g/dL   Albumin 4.3 3.5 - 5.0 g/dL   AST 25 15 - 41 U/L   ALT 14 0 - 44 U/L   Alkaline Phosphatase 55 38 - 126 U/L   Total  Bilirubin 1.4 (H) 0.3 - 1.2 mg/dL   GFR, Estimated >78 >29 mL/min   Anion gap 17 (H) 5 - 15  hCG, quantitative, pregnancy     Status: Abnormal   Collection Time: 06/17/23  9:25 PM  Result Value Ref Range   hCG, Beta Chain, Quant, S 157,269 (H) <5 mIU/mL   No results found.  MDM PE Labs: EFM  Assessment and Plan  *** G***P***  SIUP at ***weeks Cat *** FT   -Exam findings discussed. Richardson Landry MSN, CNM 06/17/2023, 11:11 PM

## 2023-06-26 NOTE — Patient Instructions (Signed)
It was wonderful to see you today.  Please bring ALL of your medications with you to every visit.   Today we talked about:  Nausea and vomiting:  Please take the following medicines   Diclegis: take one in the morning and then the second as needed during the day  Protonix: take once a day  Prednisone: take once a day with promethazine  Promethazine: take up to 4 times a day for nausea and vomiting.  Please start taking a prenatal vitamin   Please follow up 1 week   Thank you for choosing [facility].   Please call [phone] with any questions about today's appointment.  Please be sure to schedule follow up at the front desk before you leave today.   Lockie Mola, MD  Family Medicine

## 2023-06-27 LAB — COMPREHENSIVE METABOLIC PANEL
ALT: 11 [IU]/L (ref 0–32)
AST: 14 [IU]/L (ref 0–40)
Albumin: 3.9 g/dL — ABNORMAL LOW (ref 4.0–5.0)
Alkaline Phosphatase: 48 [IU]/L (ref 44–121)
BUN/Creatinine Ratio: 11 (ref 9–23)
BUN: 8 mg/dL (ref 6–20)
Bilirubin Total: 0.4 mg/dL (ref 0.0–1.2)
CO2: 18 mmol/L — ABNORMAL LOW (ref 20–29)
Calcium: 9.3 mg/dL (ref 8.7–10.2)
Chloride: 100 mmol/L (ref 96–106)
Creatinine, Ser: 0.74 mg/dL (ref 0.57–1.00)
Globulin, Total: 2.5 g/dL (ref 1.5–4.5)
Glucose: 101 mg/dL — ABNORMAL HIGH (ref 70–99)
Potassium: 4 mmol/L (ref 3.5–5.2)
Sodium: 133 mmol/L — ABNORMAL LOW (ref 134–144)
Total Protein: 6.4 g/dL (ref 6.0–8.5)
eGFR: 112 mL/min/{1.73_m2} (ref >59)

## 2023-06-29 ENCOUNTER — Telehealth: Payer: Self-pay | Admitting: Lactation Services

## 2023-06-29 ENCOUNTER — Telehealth: Payer: Self-pay | Admitting: Student

## 2023-06-29 NOTE — Telephone Encounter (Signed)
Received PA request for Scopolamine. Was prescribed by [facility], she is being transferred to [facility].   Medicaid typically covers Scopolamine patches.   Called patient to see if patient was able to obtain her Scopolamine patches, she reports she was able to get them.

## 2023-06-29 NOTE — Telephone Encounter (Signed)
**  After Hours/ Emergency Line Call**  Received a call from the mother of Sharon Barnett complaining of vomiting and nausea. Verified DOB. Endorsing worsening symptoms despite prednisone and Diclegis, has hyperemesis gravidarum.  Denying vaginal bleeding or discharge.  Recommended that if she is unable to keep any fluids down, she needs to be seen by the MAU providers for IVF if indicated.    Mother confirmed that they will be seen in MAU. Will forward to PCP.  Patient wants to speak to her PCP on Monday via telephone to discuss other options of treatment. I noted that I would forward this to the PCP.   Alfredo Martinez, MD  PGY-3, [facility] 06/29/2023 7:32 PM

## 2023-07-02 ENCOUNTER — Encounter: Payer: Self-pay | Admitting: Family Medicine

## 2023-07-02 NOTE — Progress Notes (Deleted)
    SUBJECTIVE:   CHIEF COMPLAINT / HPI:   ***  PERTINENT  PMH / PSH: ***  OBJECTIVE:   LMP 04/29/2023   ***  ASSESSMENT/PLAN:   No problem-specific Assessment & Plan notes found for this encounter.     Para March, DO [facility]

## 2023-07-06 ENCOUNTER — Other Ambulatory Visit: Payer: Self-pay

## 2023-07-06 DIAGNOSIS — O210 Mild hyperemesis gravidarum: Secondary | ICD-10-CM

## 2023-07-06 MED ORDER — PANTOPRAZOLE SODIUM 40 MG PO TBEC: 40.0000 mg | DELAYED_RELEASE_TABLET | Freq: Every day | ORAL | 0 refills | Status: DC

## 2023-07-06 MED ORDER — SCOPOLAMINE 1 MG/3DAYS TD PT72: 1.0000 | MEDICATED_PATCH | TRANSDERMAL | 0 refills | Status: DC

## 2023-07-06 MED ORDER — PROMETHAZINE HCL 25 MG PO TABS
25.0000 mg | ORAL_TABLET | Freq: Four times a day (QID) | ORAL | 0 refills | Status: DC | PRN
Start: 2023-07-06 — End: 2023-07-07

## 2023-07-07 ENCOUNTER — Inpatient Hospital Stay (HOSPITAL_COMMUNITY)
Admission: AD | Admit: 2023-07-07 | Discharge: 2023-07-07 | Disposition: A | Discharge status: Home / Self Care | Payer: Medicaid Other | Attending: Obstetrics and Gynecology | Admitting: Obstetrics and Gynecology

## 2023-07-07 ENCOUNTER — Encounter (HOSPITAL_COMMUNITY): Payer: Self-pay | Admitting: Obstetrics and Gynecology

## 2023-07-07 ENCOUNTER — Other Ambulatory Visit: Payer: Self-pay

## 2023-07-07 DIAGNOSIS — R1084 Generalized abdominal pain: Secondary | ICD-10-CM | POA: Diagnosis not present

## 2023-07-07 DIAGNOSIS — R112 Nausea with vomiting, unspecified: Secondary | ICD-10-CM | POA: Diagnosis not present

## 2023-07-07 DIAGNOSIS — Z3A09 9 weeks gestation of pregnancy: Secondary | ICD-10-CM | POA: Insufficient documentation

## 2023-07-07 DIAGNOSIS — O210 Mild hyperemesis gravidarum: Secondary | ICD-10-CM | POA: Insufficient documentation

## 2023-07-07 DIAGNOSIS — O2690 Pregnancy related conditions, unspecified, unspecified trimester: Secondary | ICD-10-CM | POA: Diagnosis not present

## 2023-07-07 LAB — COMPREHENSIVE METABOLIC PANEL
ALT: 51 U/L — ABNORMAL HIGH (ref 0–44)
AST: 27 U/L (ref 15–41)
Albumin: 3 g/dL — ABNORMAL LOW (ref 3.5–5.0)
Alkaline Phosphatase: 35 U/L — ABNORMAL LOW (ref 38–126)
Anion gap: 9 (ref 5–15)
BUN: 8 mg/dL (ref 6–20)
CO2: 22 mmol/L (ref 22–32)
Calcium: 8.8 mg/dL — ABNORMAL LOW (ref 8.9–10.3)
Chloride: 103 mmol/L (ref 98–111)
Creatinine, Ser: 0.53 mg/dL (ref 0.44–1.00)
GFR, Estimated: >60 mL/min (ref >60)
Glucose, Bld: 80 mg/dL (ref 70–99)
Potassium: 3.6 mmol/L (ref 3.5–5.1)
Sodium: 134 mmol/L — ABNORMAL LOW (ref 135–145)
Total Bilirubin: 1 mg/dL (ref 0.3–1.2)
Total Protein: 5.9 g/dL — ABNORMAL LOW (ref 6.5–8.1)

## 2023-07-07 LAB — URINALYSIS, ROUTINE W REFLEX MICROSCOPIC
Bilirubin Urine: NEGATIVE
Glucose, UA: NEGATIVE mg/dL
Hgb urine dipstick: NEGATIVE
Ketones, ur: 80 mg/dL — AB
Nitrite: NEGATIVE
Protein, ur: NEGATIVE mg/dL
Specific Gravity, Urine: 1.021 (ref 1.005–1.030)
pH: 7 (ref 5.0–8.0)

## 2023-07-07 LAB — MAGNESIUM: Magnesium: 2.1 mg/dL (ref 1.7–2.4)

## 2023-07-07 MED ORDER — PANTOPRAZOLE SODIUM 40 MG PO TBEC: 40.0000 mg | DELAYED_RELEASE_TABLET | Freq: Every day | ORAL | 2 refills | Status: DC

## 2023-07-07 MED ORDER — GLYCOPYRROLATE 2 MG PO TABS
2.0000 mg | ORAL_TABLET | Freq: Three times a day (TID) | ORAL | 3 refills | Status: DC | PRN
Start: 2023-07-07 — End: 2023-07-27

## 2023-07-07 MED ORDER — SCOPOLAMINE 1 MG/3DAYS TD PT72
1.0000 | MEDICATED_PATCH | Freq: Once | TRANSDERMAL | Status: DC
  Administered 2023-07-07: 1.5 mg via TRANSDERMAL
  Filled 2023-07-07: qty 1

## 2023-07-07 MED ORDER — PROMETHAZINE HCL 25 MG/ML IJ SOLN
25.0000 mg | Freq: Once | INTRAMUSCULAR | Status: AC
  Administered 2023-07-07: 25 mg via INTRAMUSCULAR
  Filled 2023-07-07: qty 1

## 2023-07-07 MED ORDER — PROMETHAZINE HCL 25 MG PO TABS: 25.0000 mg | ORAL_TABLET | Freq: Four times a day (QID) | ORAL | 2 refills | Status: DC | PRN

## 2023-07-07 MED ORDER — SCOPOLAMINE 1 MG/3DAYS TD PT72: 1.0000 | MEDICATED_PATCH | TRANSDERMAL | 1 refills | Status: DC

## 2023-07-07 MED ORDER — GLYCOPYRROLATE 0.2 MG/ML IJ SOLN
0.2000 mg | Freq: Once | INTRAMUSCULAR | Status: AC
  Administered 2023-07-07: 0.2 mg via INTRAVENOUS
  Filled 2023-07-07: qty 1

## 2023-07-07 NOTE — MAU Provider Note (Signed)
History     CSN: [id number]  Arrival date and time: 07/07/23 1806   Event Date/Time   First Provider Initiated Contact with Patient 07/07/23 1856      Chief Complaint  Patient presents with   Hyperemesis Gravidarum   HPI Sharon Barnett is a 32 y.o. G3P1011 at [redacted]w[redacted]d who presents via ems for n/v. Has hx of HEG in all of her pregnancies. Reports good response to her at home regimen but ran out of her medications on Thursday & symptoms have returned. States she has vomited countless times today & has been unable to keep down anything. Hasn't taken any meds since Thursday. Reports some abdominal cramping. Denies diarrhea, fever, vaginal bleeding.   OB History     Gravida  3   Para  1   Term  1   Preterm  0   AB  1   Living  1      SAB  0   IAB  1   Ectopic  0   Multiple  0   Live Births  1           Past Medical History:  Diagnosis Date   ADHD (attention deficit hyperactivity disorder)    Boils    Complication of anesthesia    Hypersensitive to anesthesia (wisdom teeth)   Headache    Hyperemesis gravidarum    Vaginal Pap smear, abnormal     Past Surgical History:  Procedure Laterality Date   OTHER SURGICAL HISTORY     elective abortion   WISDOM TOOTH EXTRACTION      Family History  Problem Relation Age of Onset   Depression Mother    Diabetes Mother    Hypertension Mother    Miscarriages / [place] Mother    Asthma Sister    Depression Sister    Hearing loss Sister    Birth defects Sister    Hyperlipidemia Sister    Hypertension Sister    Miscarriages / Stillbirths Sister    Asthma Maternal Grandmother    Cancer Maternal Grandmother    Diabetes Maternal Grandmother    Hypertension Maternal Grandmother    Stroke Maternal Grandmother    Vision loss Maternal Grandmother    Cancer Paternal Grandmother     Social History   Tobacco Use   Smoking status: Never   Smokeless tobacco: Never  Vaping Use   Vaping status: Never Used   Substance Use Topics   Alcohol use: No   Drug use: No    Allergies: No Known Allergies  Medications Prior to Admission  Medication Sig Dispense Refill Last Dose   Doxylamine-Pyridoxine (DICLEGIS) 10-10 MG TBEC Take 1 tablet by mouth 2 (two) times daily. 60 tablet 0 Past Week   Prenatal Vit-Fe Fumarate-FA (PREPLUS) 27-1 MG TABS Take 1 tablet by mouth daily. 30 tablet 13 07/07/2023   [DISCONTINUED] promethazine (PHENERGAN) 25 MG tablet Take 1 tablet (25 mg total) by mouth every 6 (six) hours as needed for nausea or vomiting. 12 tablet 0 Past Week   [DISCONTINUED] scopolamine (TRANSDERM-SCOP) 1 MG/3DAYS Place 1 patch (1.5 mg total) onto the skin every 3 (three) days. 10 patch 0 Past Week   [DISCONTINUED] pantoprazole (PROTONIX) 40 MG tablet Take 1 tablet (40 mg total) by mouth daily. 30 tablet 0     Review of Systems  All other systems reviewed and are negative.  Physical Exam   Blood pressure 118/73, pulse 78, temperature 98.8 F (37.1 C), temperature source Oral, resp. rate 18,  height 5\' 8"  (1.727 m), weight 56 kg, last menstrual period 04/29/2023, SpO2 97%, unknown if currently breastfeeding.  Physical Exam Vitals and nursing note reviewed.  Constitutional:      General: She is not in acute distress.    Appearance: She is well-developed. She is not ill-appearing.  HENT:     Head: Normocephalic and atraumatic.  Eyes:     General: No scleral icterus.       Right eye: No discharge.        Left eye: No discharge.     Conjunctiva/sclera: Conjunctivae normal.  Pulmonary:     Effort: Pulmonary effort is normal. No respiratory distress.  Neurological:     General: No focal deficit present.     Mental Status: She is alert.  Psychiatric:        Mood and Affect: Mood normal.        Behavior: Behavior normal.    Pt informed that the ultrasound is considered a limited OB ultrasound and is not intended to be a complete ultrasound exam.  Patient also informed that the ultrasound is  not being completed with the intent of assessing for fetal or placental anomalies or any pelvic abnormalities.  Explained that the purpose of today's ultrasound is to assess for  viability.  Patient acknowledges the purpose of the exam and the limitations of the study.    Live IUP. FHR 140 bpm.   MAU Course  Procedures Results for orders placed or performed during the hospital encounter of 07/07/23 (from the past 24 hour(s))  Urinalysis, Routine w reflex microscopic -Urine, Clean Catch     Status: Abnormal   Collection Time: 07/07/23  6:45 PM  Result Value Ref Range   Color, Urine YELLOW YELLOW   APPearance HAZY (A) CLEAR   Specific Gravity, Urine 1.021 1.005 - 1.030   pH 7.0 5.0 - 8.0   Glucose, UA NEGATIVE NEGATIVE mg/dL   Hgb urine dipstick NEGATIVE NEGATIVE   Bilirubin Urine NEGATIVE NEGATIVE   Ketones, ur 80 (A) NEGATIVE mg/dL   Protein, ur NEGATIVE NEGATIVE mg/dL   Nitrite NEGATIVE NEGATIVE   Leukocytes,Ua TRACE (A) NEGATIVE   RBC / HPF 0-5 0 - 5 RBC/hpf   WBC, UA 0-5 0 - 5 WBC/hpf   Bacteria, UA RARE (A) NONE SEEN   Squamous Epithelial / HPF 11-20 0 - 5 /HPF   Mucus PRESENT   Comprehensive metabolic panel     Status: Abnormal   Collection Time: 07/07/23  7:16 PM  Result Value Ref Range   Sodium 134 (L) 135 - 145 mmol/L   Potassium 3.6 3.5 - 5.1 mmol/L   Chloride 103 98 - 111 mmol/L   CO2 22 22 - 32 mmol/L   Glucose, Bld 80 70 - 99 mg/dL   BUN 8 6 - 20 mg/dL   Creatinine, Ser 1.47 0.44 - 1.00 mg/dL   Calcium 8.8 (L) 8.9 - 10.3 mg/dL   Total Protein 5.9 (L) 6.5 - 8.1 g/dL   Albumin 3.0 (L) 3.5 - 5.0 g/dL   AST 27 15 - 41 U/L   ALT 51 (H) 0 - 44 U/L   Alkaline Phosphatase 35 (L) 38 - 126 U/L   Total Bilirubin 1.0 0.3 - 1.2 mg/dL   GFR, Estimated >82 >95 mL/min   Anion gap 9 5 - 15  Magnesium     Status: None   Collection Time: 07/07/23  7:16 PM  Result Value Ref Range   Magnesium 2.1 1.7 - 2.4  mg/dL    MDM BSUS for viability IM/IV meds U/a &  labs  Assessment and Plan   1. Hyperemesis gravidarum   2. [redacted] weeks gestation of pregnancy    -No vomiting in MAU although patient with increased salivation. Treated with IV robinul, IM phenergan, & scopolamine patch. Will discharge home with refills for medications & will add robinul to regimen.  -Weight & labs stable -Has appt with her OB on Tuesday  Judeth Horn 07/07/2023, 8:58 PM

## 2023-07-07 NOTE — MAU Note (Signed)
Sharon Barnett is a 31 y.o. at [redacted]w[redacted]d here via EMS in MAU reporting: she's had N/V for the past 2 days.  Reports can't keep anything down, liquids or solids.  Reports throat is sore, burns when swallowing.  States was doing well, but ran out of medicine.  Reports she's having constant sharp lower abdominal pain.  Denies VB. LMP: NA Onset of complaint: 2 days Pain score: 8 Vitals:   07/07/23 1824  BP: 138/89  Pulse: 98  Resp: 18  Temp: 98.8 F (37.1 C)  SpO2: 97%     FHT:NA Lab orders placed from triage:   UA

## 2023-07-10 ENCOUNTER — Encounter: Payer: Medicaid Other | Admitting: Family Medicine

## 2023-07-10 ENCOUNTER — Inpatient Hospital Stay (HOSPITAL_COMMUNITY)
Admission: AD | Admit: 2023-07-10 | Discharge: 2023-07-10 | Disposition: A | Discharge status: Home / Self Care | Payer: Medicaid Other | Attending: Obstetrics & Gynecology | Admitting: Obstetrics & Gynecology

## 2023-07-10 ENCOUNTER — Encounter (HOSPITAL_COMMUNITY): Payer: Self-pay | Admitting: Obstetrics & Gynecology

## 2023-07-10 DIAGNOSIS — O210 Mild hyperemesis gravidarum: Secondary | ICD-10-CM | POA: Diagnosis present

## 2023-07-10 DIAGNOSIS — Z3A10 10 weeks gestation of pregnancy: Secondary | ICD-10-CM | POA: Insufficient documentation

## 2023-07-10 HISTORY — DX: Benign neoplasm of connective and other soft tissue, unspecified: D21.9

## 2023-07-10 LAB — URINALYSIS, ROUTINE W REFLEX MICROSCOPIC
Bilirubin Urine: NEGATIVE
Glucose, UA: NEGATIVE mg/dL
Hgb urine dipstick: NEGATIVE
Ketones, ur: 80 mg/dL — AB
Nitrite: NEGATIVE
Protein, ur: 30 mg/dL — AB
Specific Gravity, Urine: 1.025 (ref 1.005–1.030)
pH: 5 (ref 5.0–8.0)

## 2023-07-10 LAB — COMPREHENSIVE METABOLIC PANEL
ALT: 34 U/L (ref 0–44)
AST: 19 U/L (ref 15–41)
Albumin: 3.5 g/dL (ref 3.5–5.0)
Alkaline Phosphatase: 42 U/L (ref 38–126)
Anion gap: 11 (ref 5–15)
BUN: 9 mg/dL (ref 6–20)
CO2: 22 mmol/L (ref 22–32)
Calcium: 9.6 mg/dL (ref 8.9–10.3)
Chloride: 100 mmol/L (ref 98–111)
Creatinine, Ser: 0.7 mg/dL (ref 0.44–1.00)
GFR, Estimated: >60 mL/min (ref >60)
Glucose, Bld: 77 mg/dL (ref 70–99)
Potassium: 4.1 mmol/L (ref 3.5–5.1)
Sodium: 133 mmol/L — ABNORMAL LOW (ref 135–145)
Total Bilirubin: 0.7 mg/dL (ref <1.2)
Total Protein: 7.4 g/dL (ref 6.5–8.1)

## 2023-07-10 LAB — CBC
HCT: 35.2 % — ABNORMAL LOW (ref 36.0–46.0)
Hemoglobin: 11.5 g/dL — ABNORMAL LOW (ref 12.0–15.0)
MCH: 28.4 pg (ref 26.0–34.0)
MCHC: 32.7 g/dL (ref 30.0–36.0)
MCV: 86.9 fL (ref 80.0–100.0)
Platelets: 360 10*3/uL (ref 150–400)
RBC: 4.05 MIL/uL (ref 3.87–5.11)
RDW: 14.1 % (ref 11.5–15.5)
WBC: 8.6 10*3/uL (ref 4.0–10.5)
nRBC: 0 % (ref 0.0–0.2)

## 2023-07-10 MED ORDER — PROMETHAZINE HCL 12.5 MG RE SUPP: 12.5000 mg | Freq: Four times a day (QID) | RECTAL | 0 refills | Status: DC | PRN

## 2023-07-10 MED ORDER — LACTATED RINGERS IV BOLUS
1000.0000 mL | Freq: Once | INTRAVENOUS | Status: AC
  Administered 2023-07-10: 1000 mL via INTRAVENOUS

## 2023-07-10 MED ORDER — SCOPOLAMINE 1 MG/3DAYS TD PT72
1.0000 | MEDICATED_PATCH | Freq: Once | TRANSDERMAL | Status: DC
  Administered 2023-07-10: 1.5 mg via TRANSDERMAL
  Filled 2023-07-10: qty 1

## 2023-07-10 MED ORDER — PROCHLORPERAZINE EDISYLATE 10 MG/2ML IJ SOLN
10.0000 mg | Freq: Once | INTRAMUSCULAR | Status: AC
  Administered 2023-07-10: 10 mg via INTRAVENOUS
  Filled 2023-07-10: qty 2

## 2023-07-10 MED ORDER — PANTOPRAZOLE SODIUM 40 MG IV SOLR
40.0000 mg | Freq: Once | INTRAVENOUS | Status: AC
  Administered 2023-07-10: 40 mg via INTRAVENOUS
  Filled 2023-07-10: qty 10

## 2023-07-10 MED ORDER — ONDANSETRON 4 MG PO TBDP
4.0000 mg | ORAL_TABLET | Freq: Three times a day (TID) | ORAL | 0 refills | Status: DC | PRN
Start: 2023-07-10 — End: 2023-07-27

## 2023-07-10 NOTE — MAU Provider Note (Signed)
History     CSN: [id number]  Arrival date and time: 07/10/23 4098   Event Date/Time   First Provider Initiated Contact with Patient 07/10/23 1257      Chief Complaint  Patient presents with   Nausea   Emesis   Sharon Barnett is a G3P1011 at [redacted]w[redacted]d presenting with nausea/vomiting. Reports vomiting 5 times per day. Has a history of hyperemesis gravidarum.  Report decreased urine output. She is unable to keep anything down.   Emesis  This is a recurrent problem. The current episode started 1 to 4 weeks ago. The problem occurs 5 to 10 times per day. The problem has been unchanged. Associated symptoms include abdominal pain. Pertinent negatives include no chest pain, chills, coughing, diarrhea, dizziness or fever.    OB History     Gravida  3   Para  1   Term  1   Preterm  0   AB  1   Living  1      SAB  0   IAB  1   Ectopic  0   Multiple  0   Live Births  1           Past Medical History:  Diagnosis Date   ADHD (attention deficit hyperactivity disorder)    Boils    Complication of anesthesia    Hypersensitive to anesthesia (wisdom teeth)   Fibroid    Headache    Hyperemesis gravidarum    Vaginal Pap smear, abnormal     Past Surgical History:  Procedure Laterality Date   OTHER SURGICAL HISTORY     elective abortion   WISDOM TOOTH EXTRACTION      Family History  Problem Relation Age of Onset   Depression Mother    Diabetes Mother    Hypertension Mother    Miscarriages / [place] Mother    Fibroids Mother    Sarcoidosis Father    Asthma Sister    Depression Sister    Hearing loss Sister    Birth defects Sister    Hyperlipidemia Sister    Hypertension Sister    Miscarriages / Stillbirths Sister    Asthma Maternal Grandmother    Cancer Maternal Grandmother    Diabetes Maternal Grandmother    Hypertension Maternal Grandmother    Stroke Maternal Grandmother    Vision loss Maternal Grandmother    Cancer Paternal Grandmother     Social  History   Tobacco Use   Smoking status: Never   Smokeless tobacco: Never  Vaping Use   Vaping status: Never Used  Substance Use Topics   Alcohol use: No   Drug use: No    Allergies: No Known Allergies  Medications Prior to Admission  Medication Sig Dispense Refill Last Dose   Doxylamine-Pyridoxine (DICLEGIS) 10-10 MG TBEC Take 1 tablet by mouth 2 (two) times daily. 60 tablet 0    glycopyrrolate (ROBINUL) 2 MG tablet Take 1 tablet (2 mg total) by mouth 3 (three) times daily as needed (spitting, nausea). 30 tablet 3 07/08/2023   pantoprazole (PROTONIX) 40 MG tablet Take 1 tablet (40 mg total) by mouth daily. 30 tablet 2    Prenatal Vit-Fe Fumarate-FA (PREPLUS) 27-1 MG TABS Take 1 tablet by mouth daily. 30 tablet 13 07/05/2023   promethazine (PHENERGAN) 25 MG tablet Take 1 tablet (25 mg total) by mouth every 6 (six) hours as needed for nausea or vomiting. 30 tablet 2    scopolamine (TRANSDERM-SCOP) 1 MG/3DAYS Place 1 patch (1.5 mg total) onto the  skin every 3 (three) days. 10 patch 1     Review of Systems  Constitutional:  Negative for chills and fever.  HENT:  Negative for congestion and sore throat.   Eyes:  Negative for pain and visual disturbance.  Respiratory:  Negative for cough, chest tightness and shortness of breath.   Cardiovascular:  Negative for chest pain.  Gastrointestinal:  Positive for abdominal pain, nausea and vomiting. Negative for diarrhea.  Endocrine: Negative for cold intolerance and heat intolerance.  Genitourinary:  Negative for dysuria and flank pain.  Musculoskeletal:  Negative for back pain.  Skin:  Negative for rash.  Allergic/Immunologic: Negative for food allergies.  Neurological:  Negative for dizziness and light-headedness.  Psychiatric/Behavioral:  Negative for agitation.    Physical Exam   Blood pressure 125/71, pulse 90, temperature 97.9 F (36.6 C), temperature source Axillary, resp. rate 16, height 5\' 8"  (1.727 m), weight 56.2 kg, last  menstrual period 04/29/2023, SpO2 99%, unknown if currently breastfeeding.  Physical Exam Vitals and nursing note reviewed.  Constitutional:      General: She is not in acute distress.    Appearance: She is well-developed.  HENT:     Head: Normocephalic and atraumatic.  Eyes:     General: No scleral icterus.    Conjunctiva/sclera: Conjunctivae normal.  Cardiovascular:     Rate and Rhythm: Normal rate.  Pulmonary:     Effort: Pulmonary effort is normal.  Chest:     Chest wall: No tenderness.  Abdominal:     Palpations: Abdomen is soft.     Tenderness: There is no abdominal tenderness. There is no guarding or rebound.  Genitourinary:    Vagina: Normal.  Musculoskeletal:        General: Normal range of motion.     Cervical back: Normal range of motion and neck supple.  Skin:    General: Skin is warm and dry.     Findings: No rash.  Neurological:     Mental Status: She is alert and oriented to person, place, and time.     MAU Course  Procedures  [id number] MYANNA ZIESMER 18-Mar-1991  Patient informed that the ultrasound is considered a limited OB ultrasound and is not intended to be a complete ultrasound exam.  Patient also informed that the ultrasound is not being completed with the intent of assessing for fetal or placental anomalies or any pelvic abnormalities.  Explained that the purpose of today's ultrasound is to assess for  viability, FHR is 160bpm on doppler.  Patient acknowledges the purpose of the exam and the limitations of the study.  Federico Flake, MD 07/10/2023, 12:58 PM    MDM: high  This patient presents to the ED for concern of   Chief Complaint  Patient presents with   Nausea   Emesis     These complains involves an extensive number of treatment options, and is a complaint that carries with it a high risk of complications and morbidity.  The differential diagnosis for  1. Vomiting in pregnancy INCLUDES infectious causes (less likely due  to lack of infectious sx like fever/chills and otherwise normal vital signs, most likely normal variant. No signs of profound dehydration on exam.   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  The pertinent results include:   Results for orders placed or performed during the hospital encounter of 07/10/23 (from the past 24 hour(s))  Urinalysis, Routine w reflex microscopic -Urine, Clean Catch     Status: Abnormal   Collection Time: 07/10/23 10:14 AM  Result Value Ref Range   Color, Urine YELLOW YELLOW   APPearance HAZY (A) CLEAR   Specific Gravity, Urine 1.025 1.005 - 1.030   pH 5.0 5.0 - 8.0   Glucose, UA NEGATIVE NEGATIVE mg/dL   Hgb urine dipstick NEGATIVE NEGATIVE   Bilirubin Urine NEGATIVE NEGATIVE   Ketones, ur 80 (A) NEGATIVE mg/dL   Protein, ur 30 (A) NEGATIVE mg/dL   Nitrite NEGATIVE NEGATIVE   Leukocytes,Ua TRACE (A) NEGATIVE   RBC / HPF 0-5 0 - 5 RBC/hpf   WBC, UA 6-10 0 - 5 WBC/hpf   Bacteria, UA RARE (A) NONE SEEN   Squamous Epithelial / HPF 11-20 0 - 5 /HPF   Mucus PRESENT   Comprehensive metabolic panel     Status: Abnormal   Collection Time: 07/10/23 10:54 AM  Result Value Ref Range   Sodium 133 (L) 135 - 145 mmol/L   Potassium 4.1 3.5 - 5.1 mmol/L   Chloride 100 98 - 111 mmol/L   CO2 22 22 - 32 mmol/L   Glucose, Bld 77 70 - 99 mg/dL   BUN 9 6 - 20 mg/dL   Creatinine, Ser 1.61 0.44 - 1.00 mg/dL    Calcium 9.6 8.9 - 09.6 mg/dL   Total Protein 7.4 6.5 - 8.1 g/dL   Albumin 3.5 3.5 - 5.0 g/dL   AST 19 15 - 41 U/L   ALT 34 0 - 44 U/L   Alkaline Phosphatase 42 38 - 126 U/L   Total Bilirubin 0.7 <1.2 mg/dL   GFR, Estimated >04 >54 mL/min   Anion gap 11 5 - 15  CBC     Status: Abnormal   Collection Time: 07/10/23 10:54 AM  Result Value Ref Range   WBC 8.6 4.0 - 10.5 K/uL   RBC 4.05 3.87 - 5.11 MIL/uL   Hemoglobin 11.5 (L) 12.0 - 15.0 g/dL   HCT 09.8 (L) 11.9 - 14.7 %   MCV 86.9 80.0 - 100.0 fL   MCH 28.4 26.0 - 34.0 pg   MCHC 32.7 30.0 - 36.0 g/dL   RDW 82.9 56.2 - 13.0 %   Platelets 360 150 - 400 K/uL   nRBC 0.0 0.0 - 0.2 %    Medicines ordered and prescription drug management:  Medications:  Meds ordered this encounter  Medications   scopolamine (TRANSDERM-SCOP) 1 MG/3DAYS 1.5 mg   pantoprazole (PROTONIX) injection 40 mg   prochlorperazine (COMPAZINE) injection 10 mg   lactated ringers bolus 1,000 mL     Reevaluation of the patient after these medicines showed that the patient improved I have reviewed the patients home medicines and have made adjustments as needed    Critical Interventions: IV fluids   MAU Course:  12:59 PM feeling better and asking for apple juice and crackers   After the interventions noted above, I reevaluated the patient and found that they have :improved  Dispostion: discharged   Assessment and  Plan   1. Hyperemesis affecting pregnancy, antepartum   2. [redacted] weeks gestation of pregnancy    - Discharged home - medication for PRN use - Establish prenatal care  No future appointments.   Allergies as of 07/10/2023   No Known Allergies      Medication List     TAKE these medications    Diclegis 10-10 MG Tbec Generic drug: Doxylamine-Pyridoxine Take 1 tablet by mouth 2 (two) times daily.   glycopyrrolate 2 MG tablet Commonly known as: ROBINUL Take 1 tablet (2 mg total) by mouth 3 (three) times daily as needed (spitting,  nausea).   ondansetron 4 MG disintegrating tablet Commonly known as: ZOFRAN-ODT Take 1 tablet (4 mg total) by mouth every 8 (eight) hours as needed for nausea or vomiting.   pantoprazole 40 MG tablet Commonly known as: Protonix Take 1 tablet (40 mg total) by mouth daily.   PrePLUS 27-1 MG Tabs Take 1 tablet by mouth daily.   promethazine 25 MG tablet Commonly known as: PHENERGAN Take 1 tablet (25 mg total) by mouth every 6 (six) hours as needed for nausea or vomiting. What changed: Another medication with the same name was added. Make sure you understand how and when to take each.   promethazine 12.5 MG suppository Commonly known as: PHENERGAN Place 1 suppository (12.5 mg total) rectally every 6 (six) hours as needed for nausea or vomiting. What changed: You were already taking a medication with the same name, and this prescription was added. Make sure you understand how and when to take each.   scopolamine 1 MG/3DAYS Commonly known as: Transderm-Scop Place 1 patch (1.5 mg total) onto the skin every 3 (three) days.         Isa Rankin [facility] 07/10/2023, 12:57 PM

## 2023-07-10 NOTE — MAU Note (Signed)
Tender to touch rt groin, close to midline.  Noted when  checking FH

## 2023-07-10 NOTE — MAU Note (Signed)
Kanijah Groseclose Fierro is a 32 y.o. at [redacted]w[redacted]d here in MAU reporting: was supposed to be at an appointment. Was told at the wrong place.  Can't eat or drink nothing.   Doesn't feel good. Every time she throws up her chest is burning.  Denies vag bleeding or d/c.  C/o sharp pain in lower abd, throbbing in privates.   Onset of complaint: ongoing vomiting Pain score: sharp pain in lower abd  9, throbbing in privates 9, burning 10 Vitals:   07/10/23 0948  BP: 130/81  Pulse: (!) 123  Resp: 18  Temp: 97.9 F (36.6 C)  SpO2: 99%     WUJ:WJXB check later, pt unable to lay back without retching Lab orders placed from triage:  urine  Pt  wretching, spitting in triage   Has not TRIED to eat or drink since Sunday night.    Was only given Robinul when she went to pharmacy, was told others were sent to a different pharmacy, it was closed so they couldn't tranfer them

## 2023-07-10 NOTE — Discharge Instructions (Signed)
Pick up new meds.  Take as prescribed.

## 2023-07-10 NOTE — Progress Notes (Signed)
Reviewed chart and patient with known IUP on 06/25/23  [place].

## 2023-07-13 ENCOUNTER — Encounter (HOSPITAL_COMMUNITY): Payer: Self-pay | Admitting: Obstetrics & Gynecology

## 2023-07-13 ENCOUNTER — Inpatient Hospital Stay (HOSPITAL_COMMUNITY)
Admission: AD | Admit: 2023-07-13 | Discharge: 2023-07-13 | Disposition: A | Discharge status: Home / Self Care | Payer: Medicaid Other | Attending: Obstetrics & Gynecology | Admitting: Obstetrics & Gynecology

## 2023-07-13 ENCOUNTER — Telehealth: Payer: Self-pay | Admitting: Family Medicine

## 2023-07-13 DIAGNOSIS — Z3A10 10 weeks gestation of pregnancy: Secondary | ICD-10-CM | POA: Diagnosis not present

## 2023-07-13 DIAGNOSIS — Z79899 Other long term (current) drug therapy: Secondary | ICD-10-CM | POA: Diagnosis not present

## 2023-07-13 DIAGNOSIS — O210 Mild hyperemesis gravidarum: Secondary | ICD-10-CM | POA: Insufficient documentation

## 2023-07-13 MED ORDER — METHYLPREDNISOLONE SODIUM SUCC 125 MG IJ SOLR
48.0000 mg | Freq: Once | INTRAMUSCULAR | Status: AC
  Administered 2023-07-13: 48 mg via INTRAVENOUS
  Filled 2023-07-13: qty 2

## 2023-07-13 MED ORDER — FAMOTIDINE IN NACL 20-0.9 MG/50ML-% IV SOLN
20.0000 mg | Freq: Once | INTRAVENOUS | Status: AC
  Administered 2023-07-13: 20 mg via INTRAVENOUS
  Filled 2023-07-13: qty 50

## 2023-07-13 MED ORDER — METHYLPREDNISOLONE 4 MG PO TABS: ORAL_TABLET | ORAL | 0 refills | Status: DC

## 2023-07-13 MED ORDER — ONDANSETRON HCL 4 MG/2ML IJ SOLN
4.0000 mg | Freq: Once | INTRAMUSCULAR | Status: AC
  Administered 2023-07-13: 4 mg via INTRAVENOUS
  Filled 2023-07-13: qty 2

## 2023-07-13 MED ORDER — SODIUM CHLORIDE 0.9 % IV SOLN
25.0000 mg | Freq: Once | INTRAVENOUS | Status: AC
  Administered 2023-07-13: 25 mg via INTRAVENOUS
  Filled 2023-07-13: qty 1

## 2023-07-13 MED ORDER — LACTATED RINGERS IV BOLUS
1000.0000 mL | Freq: Once | INTRAVENOUS | Status: AC
  Administered 2023-07-13: 1000 mL via INTRAVENOUS

## 2023-07-13 NOTE — MAU Note (Signed)
.  Sharon Barnett is a 32 y.o. at [redacted]w[redacted]d here in MAU reporting: she has constant nausea and frequent emesis - reports she has not been able to hold down anything since Halloween. She reports burning epigastric pain 10/10 - intermittent. Denies VB or abnormal discharge. Has meds for home, but unable to keep them down.  LMP: N/A Onset of complaint: On-going Pain score: 10/10 Vitals:   07/13/23 1357  BP: 126/76  Pulse: (!) 105  Resp: 14  Temp: 99.1 F (37.3 C)  SpO2: 99%     Lab orders placed from triage:  UA

## 2023-07-13 NOTE — MAU Provider Note (Signed)
Chief Complaint: Emesis, Nausea, and Abdominal Pain   Event Date/Time   First Provider Initiated Contact with Patient 07/13/23 1423      SUBJECTIVE HPI: Sharon Barnett is a 32 y.o. G3P1011 at [redacted]w[redacted]d by LMP who presents to maternity admissions reporting unable to keep anything down.  Patient with known hyperemesis gravidarum and has been seen in MAU for the same concern previously. States she has her scopolamine patch on but otherwise unable to keep anything else down. Denies fever/chills, urinary symptoms, abdominal cramping, VB, LOF.  HPI  Past Medical History:  Diagnosis Date   ADHD (attention deficit hyperactivity disorder)    Boils    Complication of anesthesia    Hypersensitive to anesthesia (wisdom teeth)   Fibroid    Headache    Hyperemesis gravidarum    Vaginal Pap smear, abnormal    Past Surgical History:  Procedure Laterality Date   OTHER SURGICAL HISTORY     elective abortion   WISDOM TOOTH EXTRACTION     Social History   Socioeconomic History   Marital status: Significant Other    Spouse name: Not on file   Number of children: Not on file   Years of education: Not on file   Highest education level: GED or equivalent  Occupational History   Not on file  Tobacco Use   Smoking status: Never   Smokeless tobacco: Never  Vaping Use   Vaping status: Never Used  Substance and Sexual Activity   Alcohol use: No   Drug use: No   Sexual activity: Not Currently    Birth control/protection: None  Other Topics Concern   Not on file  Social History Narrative   Not on file   Social Determinants of Health   Financial Resource Strain: Low Risk  (05/29/2023)   Overall Financial Resource Strain (CARDIA)    Difficulty of Paying Living Expenses: Not hard at all  Food Insecurity: No Food Insecurity (05/29/2023)   Hunger Vital Sign    Worried About Running Out of Food in the Last Year: Never true    Ran Out of Food in the Last Year: Never true  Transportation Needs:  No Transportation Needs (05/29/2023)   PRAPARE - Administrator, Civil Service (Medical): No    Lack of Transportation (Non-Medical): No  Physical Activity: Unknown (05/29/2023)   Exercise Vital Sign    Days of Exercise per Week: 0 days    Minutes of Exercise per Session: Not on file  Stress: No Stress Concern Present (05/29/2023)   [employer] of Occupational Health - Occupational Stress Questionnaire    Feeling of Stress : Only a little  Social Connections: Socially Isolated (05/29/2023)   Social Connection and Isolation Panel [NHANES]    Frequency of Communication with Friends and Family: More than three times a week    Frequency of Social Gatherings with Friends and Family: Once a week    Attends Religious Services: Never    Database administrator or Organizations: No    Attends Engineer, structural: Not on file    Marital Status: Never married  Intimate Partner Violence: Not on file   No current facility-administered medications on file prior to encounter.   Current Outpatient Medications on File Prior to Encounter  Medication Sig Dispense Refill   pantoprazole (PROTONIX) 40 MG tablet Take 1 tablet (40 mg total) by mouth daily. 30 tablet 2   Prenatal Vit-Fe Fumarate-FA (PREPLUS) 27-1 MG TABS Take 1 tablet by mouth  daily. 30 tablet 13   scopolamine (TRANSDERM-SCOP) 1 MG/3DAYS Place 1 patch (1.5 mg total) onto the skin every 3 (three) days. 10 patch 1   Doxylamine-Pyridoxine (DICLEGIS) 10-10 MG TBEC Take 1 tablet by mouth 2 (two) times daily. 60 tablet 0   glycopyrrolate (ROBINUL) 2 MG tablet Take 1 tablet (2 mg total) by mouth 3 (three) times daily as needed (spitting, nausea). 30 tablet 3   ondansetron (ZOFRAN-ODT) 4 MG disintegrating tablet Take 1 tablet (4 mg total) by mouth every 8 (eight) hours as needed for nausea or vomiting. 30 tablet 0   promethazine (PHENERGAN) 12.5 MG suppository Place 1 suppository (12.5 mg total) rectally every 6 (six) hours as  needed for nausea or vomiting. 12 each 0   promethazine (PHENERGAN) 25 MG tablet Take 1 tablet (25 mg total) by mouth every 6 (six) hours as needed for nausea or vomiting. 30 tablet 2   [DISCONTINUED] zolpidem (AMBIEN CR) 6.25 MG CR tablet Take 1 tablet (6.25 mg total) by mouth at bedtime as needed for sleep. (Patient not taking: Reported on 02/04/2019) 15 tablet 0   No Known Allergies  ROS:  Pertinent positives/negatives listed above.  I have reviewed patient's Past Medical Hx, Surgical Hx, Family Hx, Social Hx, medications and allergies.   Physical Exam  Patient Vitals for the past 24 hrs:  BP Temp Temp src Pulse Resp SpO2  07/13/23 1718 121/74 -- -- (!) 105 -- --  07/13/23 1357 126/76 99.1 F (37.3 C) Oral (!) 105 14 99 %   Constitutional: Well-developed, well-nourished female in no acute distress. Appears fatigued. Dry MM  Cardiovascular: tachycardic Respiratory: normal effort GI: Abd soft, non-tender. Pos BS x 4 MS: Extremities nontender, no edema, normal ROM Neurologic: Alert and oriented x 4   Doppler: 131  LAB RESULTS No results found for this or any previous visit (from the past 24 hour(s)).  --/--/A POS (10/21 2156)  IMAGING US OB Comp Less 14 Wks  Result Date: 06/25/2023 CLINICAL DATA:  Pregnancy of unknown location. EXAM: OBSTETRIC <14 WK ULTRASOUND TECHNIQUE: Transabdominal ultrasound was performed for evaluation of the gestation as well as the maternal uterus and adnexal regions. COMPARISON:  Ultrasound 06/11/2023 FINDINGS: Intrauterine gestational sac: Single Yolk sac:  Visualized. Embryo:  Visualized. Cardiac Activity: Visualized. Heart Rate: Not discretely measured, but fetal cardiac activity demonstrated on cine clips. CRL:   12.3 mm   7 w 3 d                  [place] EDC: 02/08/2024 Subchorionic hemorrhage: Moderate subchorionic hemorrhage again seen, proximally 2.3 x 1.1 cm. Maternal uterus/adnexae: Large posterior fibroid measuring 6.1 x 5 x 5.4 cm. Both ovaries are  visualized and are normal, ovarian blood flow is seen. No adnexal mass or pelvic free fluid. IMPRESSION: 1. Single live intrauterine pregnancy estimated gestational age [redacted] weeks 3 days for ultrasound [facility] 02/08/2024. 2. Moderate subchorionic hemorrhage again seen. 3. Uterine fibroid. Electronically Signed   By: Narda Rutherford M.D.   On: 06/25/2023 23:23    MAU Management/MDM: Orders Placed This Encounter  Procedures   Discharge patient    Meds ordered this encounter  Medications   lactated ringers bolus 1,000 mL   promethazine (PHENERGAN) 25 mg in sodium chloride 0.9 % 50 mL IVPB   famotidine (PEPCID) IVPB 20 mg premix   ondansetron (ZOFRAN) injection 4 mg   methylPREDNISolone sodium succinate (SOLU-MEDROL) 125 mg/2 mL injection 48 mg    IV methylprednisolone will be converted to  either a q12h or q24h frequency with the same total daily dose (TDD).  Ordered Dose: 1 to 125 mg TDD; convert to: TDD q24h.  Ordered Dose: 126 to 250 mg TDD; convert to: TDD div q12h.  Ordered Dose: >250 mg TDD; DAW.   methylPREDNISolone (MEDROL) 4 MG tablet    Sig: Take 4 tablets (16 mg total) by mouth 3 (three) times daily for 5 days, THEN 2 tablets (8 mg total) 3 (three) times daily for 5 days, THEN 1 tablet (4 mg total) 3 (three) times daily for 5 days.    Dispense:  105 tablet    Refill:  0    Patient with known hyperemesis gravidarum. Dehydrated on exam. Fluids given, along with medications as above. Did not obtain labs as these were obtained 3 days ago and were without e'lyte abnormalities. Patient felt improved with above interventions and was able to keep down juice and crackers. Discussed that it is important to stay ahead of nausea instead of catching up with IV medications. Encouraged her to continue to use scopolamine patch and start using phenergan suppositories as these are ways to bypass the stomach. Will additionally add steroid taper.  ASSESSMENT 1. Hyperemesis gravidarum   2. [redacted] weeks gestation  of pregnancy     PLAN Discharge home with strict return precautions. Allergies as of 07/13/2023   No Known Allergies      Medication List     TAKE these medications    Diclegis 10-10 MG Tbec Generic drug: Doxylamine-Pyridoxine Take 1 tablet by mouth 2 (two) times daily.   glycopyrrolate 2 MG tablet Commonly known as: ROBINUL Take 1 tablet (2 mg total) by mouth 3 (three) times daily as needed (spitting, nausea).   methylPREDNISolone 4 MG tablet Commonly known as: MEDROL Take 4 tablets (16 mg total) by mouth 3 (three) times daily for 5 days, THEN 2 tablets (8 mg total) 3 (three) times daily for 5 days, THEN 1 tablet (4 mg total) 3 (three) times daily for 5 days. Start taking on: July 13, 2023   ondansetron 4 MG disintegrating tablet Commonly known as: ZOFRAN-ODT Take 1 tablet (4 mg total) by mouth every 8 (eight) hours as needed for nausea or vomiting.   pantoprazole 40 MG tablet Commonly known as: Protonix Take 1 tablet (40 mg total) by mouth daily.   PrePLUS 27-1 MG Tabs Take 1 tablet by mouth daily.   promethazine 25 MG tablet Commonly known as: PHENERGAN Take 1 tablet (25 mg total) by mouth every 6 (six) hours as needed for nausea or vomiting.   promethazine 12.5 MG suppository Commonly known as: PHENERGAN Place 1 suppository (12.5 mg total) rectally every 6 (six) hours as needed for nausea or vomiting.   scopolamine 1 MG/3DAYS Commonly known as: Transderm-Scop Place 1 patch (1.5 mg total) onto the skin every 3 (three) days.         Wylene Simmer, MD OB Fellow 07/13/2023  5:57 PM

## 2023-07-13 NOTE — Telephone Encounter (Signed)
Received after-hours page about patient.  Called listed callback number.  Patient's mother on the phone.  Who verified her date of birth.  Patient has multiple visits to MAU for hyperemesis gravidarum.  Attempted to get detailed history over past 72 hours of patient's hydration status and use of nausea medicines.  Mother is unable to report when last nausea medicine was taken.  However does say that she is failed to keep anything down over the past 24 hours liquid or solid.  Reports she is conscious and talking although sounds as if she is very tired.  Before provider can give recommendations, mother declared she was calling EMS to take patient to the ED.  Discussed that this is reasonable as I cannot personally examine her hydration status.  Suspect she may need repeat IV fluids and IV antiemetics.

## 2023-07-16 ENCOUNTER — Other Ambulatory Visit (HOSPITAL_COMMUNITY): Payer: Self-pay

## 2023-07-26 ENCOUNTER — Telehealth: Payer: Self-pay | Admitting: Student

## 2023-07-26 NOTE — Telephone Encounter (Signed)
After-hours call:  Pt's mother calls stating pt has been vomiting all day for the past 2 days.  She is [redacted] weeks pregnant.  Has not been able to keep anything down, is having stomach cramps and feeling weak. Denies feeling light headed.  On chart review, patient has been seen in the MAU 3 times earlier this month for hyperemesis gravidarum requiring IV fluids and IV antinausea medications. States she was supposed to pick up anti-nausea meds at pharmacy but never got them.  Considering patient has been vomiting for 2 days and cannot keep anything down, advised her to go to the MAU as she likely needs IV fluids, IV antinausea medications, and new Rx for antinausea meds.  Patient mother agree and will head to the MAU.

## 2023-07-27 ENCOUNTER — Inpatient Hospital Stay (HOSPITAL_COMMUNITY)
Admission: AD | Admit: 2023-07-27 | Discharge: 2023-07-27 | Disposition: A | Discharge status: Home / Self Care | Payer: Medicaid Other | Attending: Obstetrics and Gynecology | Admitting: Obstetrics and Gynecology

## 2023-07-27 ENCOUNTER — Other Ambulatory Visit (HOSPITAL_COMMUNITY): Payer: Self-pay

## 2023-07-27 DIAGNOSIS — O26891 Other specified pregnancy related conditions, first trimester: Secondary | ICD-10-CM | POA: Diagnosis present

## 2023-07-27 DIAGNOSIS — O2341 Unspecified infection of urinary tract in pregnancy, first trimester: Secondary | ICD-10-CM | POA: Insufficient documentation

## 2023-07-27 DIAGNOSIS — N390 Urinary tract infection, site not specified: Secondary | ICD-10-CM | POA: Diagnosis not present

## 2023-07-27 DIAGNOSIS — M545 Low back pain, unspecified: Secondary | ICD-10-CM | POA: Insufficient documentation

## 2023-07-27 DIAGNOSIS — O210 Mild hyperemesis gravidarum: Secondary | ICD-10-CM | POA: Insufficient documentation

## 2023-07-27 DIAGNOSIS — Z3A12 12 weeks gestation of pregnancy: Secondary | ICD-10-CM | POA: Insufficient documentation

## 2023-07-27 LAB — COMPREHENSIVE METABOLIC PANEL
ALT: 38 U/L (ref 0–44)
AST: 28 U/L (ref 15–41)
Albumin: 3.4 g/dL — ABNORMAL LOW (ref 3.5–5.0)
Alkaline Phosphatase: 46 U/L (ref 38–126)
Anion gap: 15 (ref 5–15)
BUN: 13 mg/dL (ref 6–20)
CO2: 20 mmol/L — ABNORMAL LOW (ref 22–32)
Calcium: 9.9 mg/dL (ref 8.9–10.3)
Chloride: 102 mmol/L (ref 98–111)
Creatinine, Ser: 0.75 mg/dL (ref 0.44–1.00)
GFR, Estimated: >60 mL/min (ref >60)
Glucose, Bld: 94 mg/dL (ref 70–99)
Potassium: 3.5 mmol/L (ref 3.5–5.1)
Sodium: 137 mmol/L (ref 135–145)
Total Bilirubin: 0.7 mg/dL (ref <1.2)
Total Protein: 7.8 g/dL (ref 6.5–8.1)

## 2023-07-27 LAB — URINALYSIS, ROUTINE W REFLEX MICROSCOPIC
Bilirubin Urine: NEGATIVE
Glucose, UA: NEGATIVE mg/dL
Hgb urine dipstick: NEGATIVE
Ketones, ur: 80 mg/dL — AB
Nitrite: NEGATIVE
Protein, ur: 30 mg/dL — AB
Specific Gravity, Urine: 1.024 (ref 1.005–1.030)
pH: 5 (ref 5.0–8.0)

## 2023-07-27 LAB — CBC
HCT: 36.4 % (ref 36.0–46.0)
Hemoglobin: 12.1 g/dL (ref 12.0–15.0)
MCH: 28.8 pg (ref 26.0–34.0)
MCHC: 33.2 g/dL (ref 30.0–36.0)
MCV: 86.7 fL (ref 80.0–100.0)
Platelets: 479 10*3/uL — ABNORMAL HIGH (ref 150–400)
RBC: 4.2 MIL/uL (ref 3.87–5.11)
RDW: 13.8 % (ref 11.5–15.5)
WBC: 7.5 10*3/uL (ref 4.0–10.5)
nRBC: 0 % (ref 0.0–0.2)

## 2023-07-27 MED ORDER — GLYCOPYRROLATE 2 MG PO TABS
2.0000 mg | ORAL_TABLET | Freq: Three times a day (TID) | ORAL | 0 refills | Status: DC | PRN
  Filled 2023-07-27: qty 30, 10d supply, fill #0

## 2023-07-27 MED ORDER — PANTOPRAZOLE SODIUM 40 MG PO TBEC
40.0000 mg | DELAYED_RELEASE_TABLET | Freq: Every day | ORAL | 0 refills | Status: DC
  Filled 2023-07-27: qty 30, 30d supply, fill #0

## 2023-07-27 MED ORDER — SODIUM CHLORIDE 0.9 % IV SOLN
25.0000 mg | Freq: Once | INTRAVENOUS | Status: AC
  Administered 2023-07-27: 25 mg via INTRAVENOUS
  Filled 2023-07-27: qty 1

## 2023-07-27 MED ORDER — TRANSDERM-SCOP 1 MG/3DAYS TD PT72
1.0000 | MEDICATED_PATCH | TRANSDERMAL | 0 refills | Status: DC
  Filled 2023-07-27: qty 6, 18d supply, fill #0

## 2023-07-27 MED ORDER — CEFADROXIL 500 MG PO CAPS
500.0000 mg | ORAL_CAPSULE | Freq: Two times a day (BID) | ORAL | 0 refills | Status: AC
  Filled 2023-07-27: qty 14, 7d supply, fill #0

## 2023-07-27 MED ORDER — PROMETHAZINE HCL 25 MG PO TABS
25.0000 mg | ORAL_TABLET | Freq: Four times a day (QID) | ORAL | 0 refills | Status: DC | PRN
  Filled 2023-07-27: qty 30, 8d supply, fill #0

## 2023-07-27 MED ORDER — DICLEGIS 10-10 MG PO TBEC
2.0000 | DELAYED_RELEASE_TABLET | Freq: Every day | ORAL | 0 refills | Status: AC
  Filled 2023-07-27: qty 60, 30d supply, fill #0

## 2023-07-27 MED ORDER — LACTATED RINGERS IV BOLUS
1000.0000 mL | Freq: Once | INTRAVENOUS | Status: AC
  Administered 2023-07-27: 1000 mL via INTRAVENOUS

## 2023-07-27 MED ORDER — FAMOTIDINE IN NACL 20-0.9 MG/50ML-% IV SOLN
20.0000 mg | Freq: Once | INTRAVENOUS | Status: AC
Start: 2023-07-27 — End: 2023-07-27
  Administered 2023-07-27: 20 mg via INTRAVENOUS
  Filled 2023-07-27: qty 50

## 2023-07-27 MED ORDER — ONDANSETRON 4 MG PO TBDP
4.0000 mg | ORAL_TABLET | Freq: Three times a day (TID) | ORAL | 0 refills | Status: DC | PRN
  Filled 2023-07-27: qty 30, 10d supply, fill #0

## 2023-07-27 MED ORDER — SODIUM CHLORIDE 0.9 % IV SOLN
1.0000 g | INTRAVENOUS | Status: DC
  Administered 2023-07-27: 1 g via INTRAVENOUS
  Filled 2023-07-27: qty 10

## 2023-07-27 MED ORDER — GLYCOPYRROLATE 0.2 MG/ML IJ SOLN
0.2000 mg | Freq: Once | INTRAMUSCULAR | Status: AC
Start: 2023-07-27 — End: 2023-07-27
  Administered 2023-07-27: 0.2 mg via INTRAVENOUS
  Filled 2023-07-27: qty 1

## 2023-07-27 MED ORDER — PROMETHAZINE HCL 12.5 MG RE SUPP
12.5000 mg | Freq: Four times a day (QID) | RECTAL | 0 refills | Status: DC | PRN
  Filled 2023-07-27: qty 12, 3d supply, fill #0

## 2023-07-27 MED ORDER — SODIUM CHLORIDE 0.9 % IV SOLN
8.0000 mg | Freq: Once | INTRAVENOUS | Status: AC
  Administered 2023-07-27: 8 mg via INTRAVENOUS
  Filled 2023-07-27: qty 4

## 2023-07-27 MED ORDER — GLYCOPYRROLATE 1 MG PO TABS: 1.0000 mg | ORAL_TABLET | Freq: Once | ORAL | Status: DC

## 2023-07-27 NOTE — MAU Provider Note (Signed)
History     CSN: [id number]  Arrival date and time: 07/27/23 1113   Event Date/Time   First Provider Initiated Contact with Patient 07/27/2023 12:19 PM   Chief Complaint  Patient presents with   Fatigue   Emesis   Nausea    HPI  Sharon Barnett is a 32 y.o. G3P1011 at [redacted]w[redacted]d who presents to the MAU for nausea and vomiting of pregnancy. Pt last seen 11/8 for hyperemesis, was treated with Phenergan, Pepcid, Zofran, and Solumedrol. She was discharged home with Medrol dose pack, Robinul, Zofran, Protonix, Phenergan, and Scopolamine patch. She was unable to pick up meds from pharmacy -- they said meds not in stock. Has not been able to keep anything down over past several days. Last tried eating something yesterday or the day before, but vomited it up. She has tried to keep some fluids down. Has some low back pain. No fevers, chills, abd pain, c/d.  Past Medical History:  Diagnosis Date   ADHD (attention deficit hyperactivity disorder)    Boils    Complication of anesthesia    Hypersensitive to anesthesia (wisdom teeth)   Fibroid    Headache    Hyperemesis gravidarum    Vaginal Pap smear, abnormal     Past Surgical History:  Procedure Laterality Date   OTHER SURGICAL HISTORY     elective abortion   WISDOM TOOTH EXTRACTION      Family History  Problem Relation Age of Onset   Depression Mother    Diabetes Mother    Hypertension Mother    Miscarriages / [place] Mother    Fibroids Mother    Sarcoidosis Father    Asthma Sister    Depression Sister    Hearing loss Sister    Birth defects Sister    Hyperlipidemia Sister    Hypertension Sister    Miscarriages / Stillbirths Sister    Asthma Maternal Grandmother    Cancer Maternal Grandmother    Diabetes Maternal Grandmother    Hypertension Maternal Grandmother    Stroke Maternal Grandmother    Vision loss Maternal Grandmother    Cancer Paternal Grandmother     Social History   Tobacco Use   Smoking status:  Never   Smokeless tobacco: Never  Vaping Use   Vaping status: Never Used  Substance Use Topics   Alcohol use: No   Drug use: No    Allergies: No Known Allergies  No medications prior to admission.    ROS reviewed and pertinent positives and negatives as documented in HPI.  Physical Exam   Blood pressure (!) 119/94, pulse (!) 132, temperature 98.7 F (37.1 C), temperature source Oral, resp. rate 18, height 5\' 8"  (1.727 m), weight 54.9 kg, last menstrual period 04/29/2023, SpO2 98%, unknown if currently breastfeeding.  Physical Exam Constitutional:      General: She is not in acute distress.    Appearance: Normal appearance. She is ill-appearing.  HENT:     Head: Normocephalic and atraumatic.     Mouth/Throat:     Mouth: Mucous membranes are dry.  Cardiovascular:     Rate and Rhythm: Normal rate.  Pulmonary:     Effort: Pulmonary effort is normal.     Breath sounds: Normal breath sounds.  Abdominal:     Palpations: Abdomen is soft.     Tenderness: There is no abdominal tenderness. There is no right CVA tenderness, left CVA tenderness or guarding.  Musculoskeletal:        General: Normal range  of motion.  Skin:    General: Skin is warm and dry.     Findings: No rash.  Neurological:     General: No focal deficit present.     Mental Status: She is alert and oriented to person, place, and time.     MAU Course  Procedures  MDM 32 y.o. G3P1011 at [redacted]w[redacted]d presenting for hypermesis gravidarum. Pt with tachycardia and ill-appearing on initial evaluation, so fluids and IV Pepcid and antiemetics given. Labs drawn -- no electrolyte derangements, normal CBC. UA c/f UTI, for which pt doesn't appear to have been treated -- will give a dose of CTX and send Duricef to pharmacy to tx for possible UTI which is worsening HEG symptoms. Pt PO challenged and tolerated well so meds to beds pharmacy used to provide pt with Rx prior to discharge. Stable for d/c  Assessment and Plan  Urinary  tract infection in mother during first trimester of pregnancy - Plan: Discharge patient S/p CTX x 1 dose Rx for Duricef sent Urine cx sent  Hyperemesis gravidarum - Plan: promethazine (PHENERGAN) 25 MG tablet, Discharge patient Rx for antiemetics and antiacid meds sent to [facility] pharmacy  Sundra Aland, MD OB Fellow, Faculty Practice [facility], Center for [facility]  07/27/2023, 8:02 PM

## 2023-07-27 NOTE — MAU Note (Signed)
.  Sharon Barnett is a 32 y.o. at [redacted]w[redacted]d here in MAU reporting: Persistent N/V, burning in her chest and throat, lower back and lower abdominal pain. Denies VB.   Last ate three days ago. She reports she did not have anything at all. Attempted to drink fluids throughout this time.   Onset of complaint: x4 days ago Pain score:  9/10 back 9/10 lower abdomen  Vitals:   07/27/23 1219  BP: (!) 119/94  Pulse: (!) 132  Resp: 18  Temp: 98.7 F (37.1 C)  SpO2: 98%     FHT: unable to doppler FHT - patient retching constantly.   Lab orders placed from triage: UA

## 2023-07-28 LAB — CULTURE, OB URINE

## 2023-07-30 ENCOUNTER — Encounter: Payer: Self-pay | Admitting: Family Medicine

## 2023-07-30 ENCOUNTER — Ambulatory Visit (INDEPENDENT_AMBULATORY_CARE_PROVIDER_SITE_OTHER): Payer: Medicaid Other | Admitting: Family Medicine

## 2023-07-30 ENCOUNTER — Other Ambulatory Visit (HOSPITAL_COMMUNITY)
Admission: RE | Admit: 2023-07-30 | Discharge: 2023-07-30 | Disposition: A | Discharge status: Home / Self Care | Payer: Medicaid Other | Source: Ambulatory Visit | Attending: Family Medicine | Admitting: Family Medicine

## 2023-07-30 VITALS — BP 133/81 | HR 106 | Wt 135.9 lb

## 2023-07-30 DIAGNOSIS — Z2839 Other underimmunization status: Secondary | ICD-10-CM

## 2023-07-30 DIAGNOSIS — F32A Depression, unspecified: Secondary | ICD-10-CM

## 2023-07-30 DIAGNOSIS — O09891 Supervision of other high risk pregnancies, first trimester: Secondary | ICD-10-CM

## 2023-07-30 DIAGNOSIS — Z3490 Encounter for supervision of normal pregnancy, unspecified, unspecified trimester: Secondary | ICD-10-CM | POA: Diagnosis not present

## 2023-07-30 DIAGNOSIS — O0990 Supervision of high risk pregnancy, unspecified, unspecified trimester: Secondary | ICD-10-CM | POA: Insufficient documentation

## 2023-07-30 DIAGNOSIS — O09899 Supervision of other high risk pregnancies, unspecified trimester: Secondary | ICD-10-CM

## 2023-07-30 DIAGNOSIS — Z3A12 12 weeks gestation of pregnancy: Secondary | ICD-10-CM

## 2023-07-30 DIAGNOSIS — F419 Anxiety disorder, unspecified: Secondary | ICD-10-CM | POA: Diagnosis not present

## 2023-07-30 DIAGNOSIS — Z3491 Encounter for supervision of normal pregnancy, unspecified, first trimester: Secondary | ICD-10-CM | POA: Diagnosis not present

## 2023-07-30 DIAGNOSIS — Z31430 Encounter of female for testing for genetic disease carrier status for procreative management: Secondary | ICD-10-CM | POA: Diagnosis not present

## 2023-07-30 MED ORDER — ASPIRIN 81 MG PO TBEC: 81.0000 mg | DELAYED_RELEASE_TABLET | Freq: Every day | ORAL | 3 refills | Status: DC

## 2023-07-30 MED ORDER — GOJJI WEIGHT SCALE MISC: 1.0000 | Freq: Once | 0 refills | Status: AC

## 2023-07-30 MED ORDER — BLOOD PRESSURE KIT DEVI: 1.0000 | Freq: Once | 0 refills | Status: AC

## 2023-07-30 NOTE — Progress Notes (Addendum)
Subjective:   Sharon Barnett is a 32 y.o. G3P1011 at [redacted]w[redacted]d by early ultrasound being seen today for her first obstetrical visit.  Her obstetrical history is not significant. Patient does intend to breast feed. Pregnancy history fully reviewed.  Patient reports nausea and vomiting.Though significantly improved.  HISTORY: OB History  Gravida Para Term Preterm AB Living  3 1 1  0 1 1  SAB IAB Ectopic Multiple Live Births  0 1 0 0 1    # Outcome Date GA Lbr Len/2nd Weight Sex Type Anes PTL Lv  3 Current           2 IAB 07/25/22 [redacted]w[redacted]d         1 Term 07/18/16 [redacted]w[redacted]d  8 lb 1.8 oz (3.68 kg) M Vag-Spont   LIV   Past Medical History:  Diagnosis Date   ADHD (attention deficit hyperactivity disorder)    Complication of anesthesia    Hypersensitive to anesthesia (wisdom teeth)   Fibroid    Headache    Hyperemesis gravidarum    Past Surgical History:  Procedure Laterality Date   OTHER SURGICAL HISTORY     elective abortion   WISDOM TOOTH EXTRACTION     Family History  Problem Relation Age of Onset   Depression Mother    Diabetes Mother    Hypertension Mother    Fibroids Mother    Sarcoidosis Father    Asthma Sister    Depression Sister    Hearing loss Sister    Hyperlipidemia Sister    Hypertension Sister    Miscarriages / Stillbirths Sister    Hearing loss Maternal Grandmother    Diabetes Maternal Grandmother    Hypertension Maternal Grandmother    Stroke Maternal Grandmother    Brain cancer Maternal Grandmother    Hodgkin's lymphoma Niece    Social History   Tobacco Use   Smoking status: Never   Smokeless tobacco: Never  Vaping Use   Vaping status: Never Used  Substance Use Topics   Alcohol use: No   Drug use: Not Currently    Types: Marijuana   No Known Allergies Current Outpatient Medications on File Prior to Visit  Medication Sig Dispense Refill   cefadroxil (DURICEF) 500 MG capsule Take 1 capsule (500 mg total) by mouth 2 (two) times daily for 7 days.  14 capsule 0   Doxylamine-Pyridoxine (DICLEGIS) 10-10 MG TBEC Take 2 tablets by mouth daily. 60 tablet 0   glycopyrrolate (ROBINUL) 2 MG tablet Take 1 tablet (2 mg total) by mouth 3 (three) times daily as needed (spitting, nausea). 30 tablet 0   ondansetron (ZOFRAN-ODT) 4 MG disintegrating tablet Take 1 tablet (4 mg total) by mouth every 8 (eight) hours as needed for nausea or vomiting. 30 tablet 0   pantoprazole (PROTONIX) 40 MG tablet Take 1 tablet (40 mg total) by mouth daily. 30 tablet 0   promethazine (PHENERGAN) 25 MG tablet Take 1 tablet (25 mg total) by mouth every 6 (six) hours as needed for nausea or vomiting. 30 tablet 0   scopolamine (TRANSDERM-SCOP) 1 MG/3DAYS Place 1 patch (1.5 mg total) onto the skin every 3 (three) days. 10 patch 0   Prenatal Vit-Fe Fumarate-FA (PREPLUS) 27-1 MG TABS Take 1 tablet by mouth daily. (Patient not taking: Reported on 07/30/2023) 30 tablet 13   promethazine (PHENERGAN) 12.5 MG suppository Place 1 suppository (12.5 mg total) rectally every 6 (six) hours as needed for nausea or vomiting. (Patient not taking: Reported on 07/30/2023) 12  each 0   [DISCONTINUED] zolpidem (AMBIEN CR) 6.25 MG CR tablet Take 1 tablet (6.25 mg total) by mouth at bedtime as needed for sleep. (Patient not taking: Reported on 02/04/2019) 15 tablet 0   No current facility-administered medications on file prior to visit.     Exam   Vitals:   07/30/23 1032  BP: 133/81  Pulse: (!) 106  Weight: 135 lb 14.4 oz (61.6 kg)   Fetal Heart Rate (bpm): 150  Pelvic Exam: Perineum: no hemorrhoids, normal perineum   Vulva: normal external genitalia, no lesions   Vagina:  normal mucosa, normal discharge   Cervix: no lesions and normal, pap smear done.    Adnexa: normal adnexa and no mass, fullness, tenderness   Bony Pelvis: average  System: General: well-developed, well-nourished female in no acute distress   Breast:  normal appearance, no masses or tenderness   Skin: normal coloration  and turgor, no rashes   Neurologic: oriented, normal, negative, normal mood   Extremities: normal strength, tone, and muscle mass, ROM of all joints is normal   HEENT PERRLA, extraocular movement intact and sclera clear, anicteric   Mouth/Teeth mucous membranes moist, pharynx normal without lesions and dental hygiene good   Neck supple and no masses   Cardiovascular: regular rate and rhythm   Respiratory:  no respiratory distress, normal breath sounds   Abdomen: soft, non-tender; bowel sounds normal; no masses,  no organomegaly     Assessment:   Pregnancy: G3P1011 Patient Active Problem List   Diagnosis Date Noted   Supervision of low-risk pregnancy 07/30/2023   Migraine 05/08/2023   Acute nonintractable headache 02/22/2023   Abnormal uterine bleeding (AUB) 02/15/2023   Other fatigue 02/15/2023   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 Encounter for supervision of low-risk pregnancy, antepartum New OB labs today Begin ASA due to f/h of PIH/HTN/AA race - Cytology - PAP( [place]) - CBC/D/Plt+RPR+Rh+ABO+RubIgG... - HgB A1c - Culture, OB Urine - PANORAMA PRENATAL TEST - [place] Basic Panel - Blood Pressure Monitoring (BLOOD PRESSURE KIT) DEVI; 1 Device by Does not apply route once for 1 dose.  Dispense: 1 each; Refill: 0 - Misc. Devices (GOJJI WEIGHT SCALE) MISC; 1 Device by Does not apply route once for 1 dose.  Dispense: 1 each; Refill: 0 - aspirin EC 81 MG tablet; Take 1 tablet (81 mg total) by mouth daily.  Dispense: 90 tablet; Refill: 3 - CHL AMB BABYSCRIPTS SCHEDULE OPTIMIZATION - [place] MFM OB COMP + 14 WK; Future  2. Anxiety and depression - Ambulatory referral to Integrated Behavioral Health  3. [redacted] weeks gestation of pregnancy   Initial labs drawn. Continue prenatal vitamins. Genetic Screening discussed, NIPS: ordered. Ultrasound discussed; fetal anatomic survey: ordered. Problem list reviewed and  updated.  Routine obstetric precautions reviewed. Return in 4 weeks (on 08/27/2023).

## 2023-07-30 NOTE — Addendum Note (Signed)
Addended byVidal Schwalbe on: 07/30/2023 11:12 AM   Modules accepted: Orders

## 2023-07-31 ENCOUNTER — Encounter: Payer: Self-pay | Admitting: *Deleted

## 2023-07-31 DIAGNOSIS — Z3490 Encounter for supervision of normal pregnancy, unspecified, unspecified trimester: Secondary | ICD-10-CM | POA: Diagnosis not present

## 2023-07-31 LAB — CBC/D/PLT+RPR+RH+ABO+RUBIGG...
Antibody Screen: NEGATIVE
Basophils Absolute: 0 10*3/uL (ref 0.0–0.2)
Basos: 0 %
EOS (ABSOLUTE): 0 10*3/uL (ref 0.0–0.4)
Eos: 0 %
HCV Ab: NONREACTIVE
HIV Screen 4th Generation wRfx: NONREACTIVE
Hematocrit: 32.1 % — ABNORMAL LOW (ref 34.0–46.6)
Hemoglobin: 10.4 g/dL — ABNORMAL LOW (ref 11.1–15.9)
Hepatitis B Surface Ag: NEGATIVE
Immature Grans (Abs): 0.1 10*3/uL (ref 0.0–0.1)
Immature Granulocytes: 1 %
Lymphocytes Absolute: 1.8 10*3/uL (ref 0.7–3.1)
Lymphs: 23 %
MCH: 29.5 pg (ref 26.6–33.0)
MCHC: 32.4 g/dL (ref 31.5–35.7)
MCV: 91 fL (ref 79–97)
Monocytes Absolute: 0.4 10*3/uL (ref 0.1–0.9)
Monocytes: 5 %
Neutrophils Absolute: 5.7 10*3/uL (ref 1.4–7.0)
Neutrophils: 71 %
Platelets: 379 10*3/uL (ref 150–450)
RBC: 3.53 x10E6/uL — ABNORMAL LOW (ref 3.77–5.28)
RDW: 13.1 % (ref 11.7–15.4)
RPR Ser Ql: NONREACTIVE
Rh Factor: POSITIVE
Rubella Antibodies, IGG: <0.9 {index} — ABNORMAL LOW (ref >0.99)
WBC: 8 10*3/uL (ref 3.4–10.8)

## 2023-07-31 LAB — HCV INTERPRETATION

## 2023-07-31 LAB — HEMOGLOBIN A1C
Est. average glucose Bld gHb Est-mCnc: 88 mg/dL
Hgb A1c MFr Bld: 4.7 % — ABNORMAL LOW (ref 4.8–5.6)

## 2023-08-01 DIAGNOSIS — O09899 Supervision of other high risk pregnancies, unspecified trimester: Secondary | ICD-10-CM | POA: Insufficient documentation

## 2023-08-02 LAB — CULTURE, OB URINE

## 2023-08-02 LAB — URINE CULTURE, OB REFLEX: Organism ID, Bacteria: NO GROWTH

## 2023-08-06 LAB — PANORAMA PRENATAL TEST FULL PANEL:PANORAMA TEST PLUS 5 ADDITIONAL MICRODELETIONS: FETAL FRACTION: 9.5

## 2023-08-07 ENCOUNTER — Telehealth: Payer: Self-pay | Admitting: Lactation Services

## 2023-08-07 ENCOUNTER — Encounter: Payer: Self-pay | Admitting: Family Medicine

## 2023-08-07 DIAGNOSIS — R87613 High grade squamous intraepithelial lesion on cytologic smear of cervix (HGSIL): Secondary | ICD-10-CM | POA: Insufficient documentation

## 2023-08-07 LAB — CYTOLOGY - PAP
Comment: NEGATIVE
Comment: NEGATIVE
Comment: NEGATIVE
Diagnosis: HIGH — AB
HPV 16: NEGATIVE
HPV 18 / 45: NEGATIVE
High risk HPV: POSITIVE — AB

## 2023-08-07 NOTE — Telephone Encounter (Signed)
-----   Message from Reva Bores sent at 08/07/2023  8:13 AM EST ----- Needs colpo

## 2023-08-07 NOTE — Telephone Encounter (Signed)
Called patient with results of Pap Smear and recommendation for Colpo. Patient did not answer. LM for patient to call the office at [phone] at her earliest convenience for results and recommendations.

## 2023-08-09 NOTE — Telephone Encounter (Addendum)
Called pt and she did not answer.  Message left with request for her to call us back. Note placed on her Ob f/u appt 12/23 that she needs Colpo.   12/9 1015  Pt care issue addressed in a different encounter.

## 2023-08-10 ENCOUNTER — Telehealth: Payer: Self-pay | Admitting: Family Medicine

## 2023-08-10 LAB — HORIZON CUSTOM: REPORT SUMMARY: NEGATIVE

## 2023-08-10 NOTE — Telephone Encounter (Signed)
Patient said she need a Refill on All of her medication, the pharmacy told her to reach out to the office.   [facility] [address]

## 2023-08-13 ENCOUNTER — Telehealth: Payer: Self-pay | Admitting: Family Medicine

## 2023-08-13 DIAGNOSIS — O210 Mild hyperemesis gravidarum: Secondary | ICD-10-CM

## 2023-08-13 MED ORDER — PROMETHAZINE HCL 25 MG PO TABS: 25.0000 mg | ORAL_TABLET | Freq: Four times a day (QID) | ORAL | 1 refills | Status: DC | PRN

## 2023-08-13 MED ORDER — GLYCOPYRROLATE 2 MG PO TABS: 2.0000 mg | ORAL_TABLET | Freq: Three times a day (TID) | ORAL | 2 refills | Status: DC | PRN

## 2023-08-13 MED ORDER — PANTOPRAZOLE SODIUM 40 MG PO TBEC: 40.0000 mg | DELAYED_RELEASE_TABLET | Freq: Every day | ORAL | 3 refills | Status: DC

## 2023-08-13 MED ORDER — SCOPOLAMINE 1 MG/3DAYS TD PT72: 1.0000 | MEDICATED_PATCH | TRANSDERMAL | 1 refills | Status: DC

## 2023-08-13 NOTE — Telephone Encounter (Signed)
Patient called in regarding her results, she received multiple messages to call us back. And also needs refills on her medication.

## 2023-08-13 NOTE — Addendum Note (Signed)
Addended by: Ed Blalock on: 08/13/2023 04:30 PM   Modules accepted: Orders

## 2023-08-13 NOTE — Telephone Encounter (Addendum)
Called pt and she did not answer. Message left stating that I am calling to answer any questions she may have regarding recent test result. Per chart review, pt has read the message from Dr. Shawnie Pons regarding abnormal Pap and need for Colpo. This will most likely be performed during visit on 12/23. Also requested pt to call back and state which specific medication she needs refilled as some already have refills available.   1155  Spoke w/pt and informed that most likely she will have Colposcopy due to abnormal Pap on 12/23 during prenatal visit. Confirmation received of medications needed for refill: Scopolamine, Pantoprazole, Promethazine tabs, Robinul. Prescriptions [facility] on [address]  Pt voiced understanding of all information given.

## 2023-08-13 NOTE — Telephone Encounter (Signed)
Pt concern handled in a different encounter.

## 2023-08-16 ENCOUNTER — Other Ambulatory Visit (HOSPITAL_COMMUNITY): Payer: Self-pay

## 2023-08-20 NOTE — BH Specialist Note (Signed)
Pt did not arrive to video visit and did not answer the phone; Left HIPPA-compliant message to call back Aryahna Spagna from [facility]  [facility] at  [phone] (Ilir Mahrt's office).  ?; left MyChart message for patient.  ? ?

## 2023-08-22 ENCOUNTER — Inpatient Hospital Stay (HOSPITAL_COMMUNITY): Payer: Medicaid Other

## 2023-08-22 ENCOUNTER — Encounter (HOSPITAL_COMMUNITY): Payer: Self-pay | Admitting: Obstetrics & Gynecology

## 2023-08-22 ENCOUNTER — Inpatient Hospital Stay (HOSPITAL_COMMUNITY)
Admission: AD | Admit: 2023-08-22 | Discharge: 2023-08-22 | Disposition: A | Discharge status: Home / Self Care | Payer: Medicaid Other | Attending: Obstetrics & Gynecology | Admitting: Obstetrics & Gynecology

## 2023-08-22 DIAGNOSIS — Z3A15 15 weeks gestation of pregnancy: Secondary | ICD-10-CM | POA: Diagnosis not present

## 2023-08-22 DIAGNOSIS — G43009 Migraine without aura, not intractable, without status migrainosus: Secondary | ICD-10-CM | POA: Diagnosis not present

## 2023-08-22 DIAGNOSIS — O98812 Other maternal infectious and parasitic diseases complicating pregnancy, second trimester: Secondary | ICD-10-CM | POA: Diagnosis not present

## 2023-08-22 DIAGNOSIS — D251 Intramural leiomyoma of uterus: Secondary | ICD-10-CM | POA: Diagnosis not present

## 2023-08-22 DIAGNOSIS — R102 Pelvic and perineal pain: Secondary | ICD-10-CM | POA: Diagnosis present

## 2023-08-22 DIAGNOSIS — O26892 Other specified pregnancy related conditions, second trimester: Secondary | ICD-10-CM | POA: Diagnosis not present

## 2023-08-22 DIAGNOSIS — K37 Unspecified appendicitis: Secondary | ICD-10-CM | POA: Diagnosis not present

## 2023-08-22 DIAGNOSIS — B379 Candidiasis, unspecified: Secondary | ICD-10-CM | POA: Diagnosis not present

## 2023-08-22 DIAGNOSIS — R1084 Generalized abdominal pain: Secondary | ICD-10-CM | POA: Diagnosis not present

## 2023-08-22 DIAGNOSIS — O99352 Diseases of the nervous system complicating pregnancy, second trimester: Secondary | ICD-10-CM | POA: Diagnosis not present

## 2023-08-22 DIAGNOSIS — R1031 Right lower quadrant pain: Secondary | ICD-10-CM | POA: Diagnosis not present

## 2023-08-22 HISTORY — DX: Urinary tract infection, site not specified: N39.0

## 2023-08-22 LAB — URINALYSIS, ROUTINE W REFLEX MICROSCOPIC
Bilirubin Urine: NEGATIVE
Glucose, UA: NEGATIVE mg/dL
Hgb urine dipstick: NEGATIVE
Ketones, ur: 20 mg/dL — AB
Leukocytes,Ua: NEGATIVE
Nitrite: NEGATIVE
Protein, ur: NEGATIVE mg/dL
Specific Gravity, Urine: 1.025 (ref 1.005–1.030)
pH: 5 (ref 5.0–8.0)

## 2023-08-22 LAB — COMPREHENSIVE METABOLIC PANEL
ALT: 19 U/L (ref 0–44)
AST: 23 U/L (ref 15–41)
Albumin: 2.7 g/dL — ABNORMAL LOW (ref 3.5–5.0)
Alkaline Phosphatase: 43 U/L (ref 38–126)
Anion gap: 10 (ref 5–15)
BUN: 8 mg/dL (ref 6–20)
CO2: 21 mmol/L — ABNORMAL LOW (ref 22–32)
Calcium: 8.9 mg/dL (ref 8.9–10.3)
Chloride: 104 mmol/L (ref 98–111)
Creatinine, Ser: 0.68 mg/dL (ref 0.44–1.00)
GFR, Estimated: >60 mL/min (ref >60)
Glucose, Bld: 77 mg/dL (ref 70–99)
Potassium: 3.6 mmol/L (ref 3.5–5.1)
Sodium: 135 mmol/L (ref 135–145)
Total Bilirubin: 0.7 mg/dL (ref <1.2)
Total Protein: 6.2 g/dL — ABNORMAL LOW (ref 6.5–8.1)

## 2023-08-22 LAB — CBC
HCT: 28.3 % — ABNORMAL LOW (ref 36.0–46.0)
Hemoglobin: 9.4 g/dL — ABNORMAL LOW (ref 12.0–15.0)
MCH: 29.1 pg (ref 26.0–34.0)
MCHC: 33.2 g/dL (ref 30.0–36.0)
MCV: 87.6 fL (ref 80.0–100.0)
Platelets: 302 10*3/uL (ref 150–400)
RBC: 3.23 MIL/uL — ABNORMAL LOW (ref 3.87–5.11)
RDW: 13.7 % (ref 11.5–15.5)
WBC: 10.9 10*3/uL — ABNORMAL HIGH (ref 4.0–10.5)
nRBC: 0 % (ref 0.0–0.2)

## 2023-08-22 LAB — WET PREP, GENITAL
Clue Cells Wet Prep HPF POC: NONE SEEN
Sperm: NONE SEEN
Trich, Wet Prep: NONE SEEN
WBC, Wet Prep HPF POC: >=10 — AB (ref <10)

## 2023-08-22 MED ORDER — ACETAMINOPHEN 500 MG PO TABS
1000.0000 mg | ORAL_TABLET | Freq: Once | ORAL | Status: AC
  Administered 2023-08-22: 1000 mg via ORAL
  Filled 2023-08-22: qty 2

## 2023-08-22 MED ORDER — DIPHENHYDRAMINE HCL 25 MG PO CAPS
25.0000 mg | ORAL_CAPSULE | Freq: Once | ORAL | Status: AC
  Administered 2023-08-22: 25 mg via ORAL
  Filled 2023-08-22: qty 1

## 2023-08-22 MED ORDER — MICONAZOLE NITRATE 2 % VA CREA: 1.0000 | TOPICAL_CREAM | Freq: Every day | VAGINAL | 2 refills | Status: DC

## 2023-08-22 MED ORDER — SIMETHICONE 80 MG PO CHEW
80.0000 mg | CHEWABLE_TABLET | Freq: Once | ORAL | Status: AC
  Administered 2023-08-22: 80 mg via ORAL
  Filled 2023-08-22: qty 1

## 2023-08-22 MED ORDER — METOCLOPRAMIDE HCL 10 MG PO TABS: 10.0000 mg | ORAL_TABLET | Freq: Three times a day (TID) | ORAL | 2 refills | Status: DC | PRN

## 2023-08-22 MED ORDER — SCOPOLAMINE 1 MG/3DAYS TD PT72: 1.0000 | MEDICATED_PATCH | TRANSDERMAL | 2 refills | Status: DC

## 2023-08-22 MED ORDER — METOCLOPRAMIDE HCL 10 MG PO TABS
10.0000 mg | ORAL_TABLET | Freq: Once | ORAL | Status: AC
  Administered 2023-08-22: 10 mg via ORAL
  Filled 2023-08-22: qty 1

## 2023-08-22 MED ORDER — DIPHENHYDRAMINE HCL 25 MG PO CAPS: 25.0000 mg | ORAL_CAPSULE | Freq: Four times a day (QID) | ORAL | 2 refills | Status: AC | PRN

## 2023-08-22 NOTE — MAU Note (Signed)
Sharon Barnett is a 32 y.o. at [redacted]w[redacted]d here in MAU reporting: [facility]  Report from EMS:  0400 cramping in lower abd, 8/10.  Saw spotting when she wiped. VSS.  ? Abn on scan, next appt is 12/23  Been having headaches for 3-4 days, wasn't able to take no medicine at all. Also been having nose bleeds.  This is first episode of bleeding.  Cramping is when she stands up, in her privates. Has been constipated, last BM was Sunday.  Hurts to put her legs together. Onset of complaint: 0400 Pain score: 8, HA 10 Vitals:   08/22/23 1018  BP: 119/71  Pulse: 85  Resp: 18  Temp: 98.3 F (36.8 C)  SpO2: 99%     FHT:146 Lab orders placed from triage:  urine

## 2023-08-22 NOTE — MAU Note (Signed)
Pt called out that she needed to pick her son up by 1715 and needs to go. Same reported to dr Earlene Plater. Will discharge pt but she verbalizes that she will return right away if called to do so.

## 2023-08-22 NOTE — MAU Provider Note (Signed)
Chief Complaint: Vaginal Bleeding, Abdominal Pain, and Headache   Event Date/Time   First Provider Initiated Contact with Patient 08/22/23 1045      SUBJECTIVE HPI: Sharon Barnett is a 32 y.o. G3P1011 at [redacted]w[redacted]d by LMP who presents to maternity admissions reporting VB, pelvic pressure.  Patient notes dark brown discharge at 4 AM. Feeling some pelvic pressure as well. Notes headache for past 3-4 days. Frontal. Not sure what is safe to take during pregnancy. HG medications working, however needs some refills.  HPI  Past Medical History:  Diagnosis Date   ADHD (attention deficit hyperactivity disorder)    Complication of anesthesia    Hypersensitive to anesthesia (wisdom teeth)   Fibroid    Headache    Hyperemesis gravidarum    UTI (urinary tract infection)    Past Surgical History:  Procedure Laterality Date   OTHER SURGICAL HISTORY     elective abortion   WISDOM TOOTH EXTRACTION     Social History   Socioeconomic History   Marital status: Significant Other    Spouse name: Not on file   Number of children: Not on file   Years of education: Not on file   Highest education level: GED or equivalent  Occupational History   Occupation: [employer]  Tobacco Use   Smoking status: Never   Smokeless tobacco: Never  Vaping Use   Vaping status: Never Used  Substance and Sexual Activity   Alcohol use: No   Drug use: Not Currently    Types: Marijuana    Comment: not since +preg   Sexual activity: Not Currently    Birth control/protection: None  Other Topics Concern   Not on file  Social History Narrative   Not on file   Social Drivers of Health   Financial Resource Strain: Low Risk  (05/29/2023)   Overall Financial Resource Strain (CARDIA)    Difficulty of Paying Living Expenses: Not hard at all  Food Insecurity: No Food Insecurity (07/30/2023)   Hunger Vital Sign    Worried About Running Out of Food in the Last Year: Never true    Ran Out of Food in the Last Year: Never  true  Transportation Needs: Unmet Transportation Needs (07/30/2023)   PRAPARE - Transportation    Lack of Transportation (Medical): Yes    Lack of Transportation (Non-Medical): Yes  Physical Activity: Unknown (05/29/2023)   Exercise Vital Sign    Days of Exercise per Week: 0 days    Minutes of Exercise per Session: Not on file  Stress: No Stress Concern Present (05/29/2023)   [employer] of Occupational Health - Occupational Stress Questionnaire    Feeling of Stress : Only a little  Social Connections: Socially Isolated (05/29/2023)   Social Connection and Isolation Panel [NHANES]    Frequency of Communication with Friends and Family: More than three times a week    Frequency of Social Gatherings with Friends and Family: Once a week    Attends Religious Services: Never    Database administrator or Organizations: No    Attends Engineer, structural: Not on file    Marital Status: Never married  Intimate Partner Violence: Not on file   No current facility-administered medications on file prior to encounter.   Current Outpatient Medications on File Prior to Encounter  Medication Sig Dispense Refill   aspirin EC 81 MG tablet Take 1 tablet (81 mg total) by mouth daily. 90 tablet 3   Doxylamine-Pyridoxine (DICLEGIS) 10-10 MG TBEC Take 2  tablets by mouth daily. 60 tablet 0   glycopyrrolate (ROBINUL) 2 MG tablet Take 1 tablet (2 mg total) by mouth 3 (three) times daily as needed (spitting, nausea). 30 tablet 2   pantoprazole (PROTONIX) 40 MG tablet Take 1 tablet (40 mg total) by mouth daily. 30 tablet 3   promethazine (PHENERGAN) 25 MG tablet Take 1 tablet (25 mg total) by mouth every 6 (six) hours as needed for nausea or vomiting. 30 tablet 1   ondansetron (ZOFRAN-ODT) 4 MG disintegrating tablet Take 1 tablet (4 mg total) by mouth every 8 (eight) hours as needed for nausea or vomiting. 30 tablet 0   Prenatal Vit-Fe Fumarate-FA (PREPLUS) 27-1 MG TABS Take 1 tablet by mouth daily.  (Patient not taking: Reported on 07/30/2023) 30 tablet 13   promethazine (PHENERGAN) 12.5 MG suppository Place 1 suppository (12.5 mg total) rectally every 6 (six) hours as needed for nausea or vomiting. (Patient not taking: Reported on 07/30/2023) 12 each 0   [DISCONTINUED] zolpidem (AMBIEN CR) 6.25 MG CR tablet Take 1 tablet (6.25 mg total) by mouth at bedtime as needed for sleep. (Patient not taking: Reported on 02/04/2019) 15 tablet 0   No Known Allergies  ROS:  Pertinent positives/negatives listed above.  I have reviewed patient's Past Medical Hx, Surgical Hx, Family Hx, Social Hx, medications and allergies.   Physical Exam  Patient Vitals for the past 24 hrs:  BP Temp Temp src Pulse Resp SpO2 Height Weight  08/22/23 1647 130/75 -- -- 78 -- -- -- --  08/22/23 1018 119/71 98.3 F (36.8 C) Oral 85 18 99 % 5\' 8"  (1.727 m) 73.7 kg   Constitutional: Well-developed, well-nourished female in no acute distress. Appears fatigued Cardiovascular: normal rate Respiratory: normal effort GI: Abd soft, suprapubic tenderness. Pos BS x 4 MS: Extremities nontender, no edema, normal ROM Neurologic: Alert and oriented x 4. No gross abnormalities  PELVIC EXAM: Patient exquisitely tender upon exam. Difficult to fully evaluate. Visualized edge of cervix. No blood present. Thick, white discharge present  Doppler: 146  LAB RESULTS Results for orders placed or performed during the hospital encounter of 08/22/23 (from the past 24 hours)  Urinalysis, Routine w reflex microscopic -Urine, Clean Catch     Status: Abnormal   Collection Time: 08/22/23 10:45 AM  Result Value Ref Range   Color, Urine YELLOW YELLOW   APPearance HAZY (A) CLEAR   Specific Gravity, Urine 1.025 1.005 - 1.030   pH 5.0 5.0 - 8.0   Glucose, UA NEGATIVE NEGATIVE mg/dL   Hgb urine dipstick NEGATIVE NEGATIVE   Bilirubin Urine NEGATIVE NEGATIVE   Ketones, ur 20 (A) NEGATIVE mg/dL   Protein, ur NEGATIVE NEGATIVE mg/dL   Nitrite  NEGATIVE NEGATIVE   Leukocytes,Ua NEGATIVE NEGATIVE  Wet prep, genital     Status: Abnormal   Collection Time: 08/22/23 11:00 AM  Result Value Ref Range   Yeast Wet Prep HPF POC PRESENT (A) NONE SEEN   Trich, Wet Prep NONE SEEN NONE SEEN   Clue Cells Wet Prep HPF POC NONE SEEN NONE SEEN   WBC, Wet Prep HPF POC >=10 (A) <10   Sperm NONE SEEN   CBC     Status: Abnormal   Collection Time: 08/22/23 12:40 PM  Result Value Ref Range   WBC 10.9 (H) 4.0 - 10.5 K/uL   RBC 3.23 (L) 3.87 - 5.11 MIL/uL   Hemoglobin 9.4 (L) 12.0 - 15.0 g/dL   HCT 69.6 (L) 29.5 - 28.4 %   MCV  87.6 80.0 - 100.0 fL   MCH 29.1 26.0 - 34.0 pg   MCHC 33.2 30.0 - 36.0 g/dL   RDW 65.7 84.6 - 96.2 %   Platelets 302 150 - 400 K/uL   nRBC 0.0 0.0 - 0.2 %  Comprehensive metabolic panel     Status: Abnormal   Collection Time: 08/22/23 12:40 PM  Result Value Ref Range   Sodium 135 135 - 145 mmol/L   Potassium 3.6 3.5 - 5.1 mmol/L   Chloride 104 98 - 111 mmol/L   CO2 21 (L) 22 - 32 mmol/L   Glucose, Bld 77 70 - 99 mg/dL   BUN 8 6 - 20 mg/dL   Creatinine, Ser 9.52 0.44 - 1.00 mg/dL   Calcium 8.9 8.9 - 84.1 mg/dL   Total Protein 6.2 (L) 6.5 - 8.1 g/dL   Albumin 2.7 (L) 3.5 - 5.0 g/dL   AST 23 15 - 41 U/L   ALT 19 0 - 44 U/L   Alkaline Phosphatase 43 38 - 126 U/L   Total Bilirubin 0.7 <1.2 mg/dL   GFR, Estimated >32 >44 mL/min   Anion gap 10 5 - 15    A/Positive/-- (11/25 1119)  IMAGING No results found.  MAU Management/MDM: Orders Placed This Encounter  Procedures   Wet prep, genital   MR PELVIS WO CONTRAST   MR ABDOMEN WO CONTRAST   Urinalysis, Routine w reflex microscopic -Urine, Clean Catch   CBC   Comprehensive metabolic panel   Discharge patient    Meds ordered this encounter  Medications   acetaminophen (TYLENOL) tablet 1,000 mg   metoCLOPramide (REGLAN) tablet 10 mg   diphenhydrAMINE (BENADRYL) capsule 25 mg   simethicone (MYLICON) chewable tablet 80 mg   scopolamine (TRANSDERM-SCOP) 1  MG/3DAYS    Sig: Place 1 patch (1.5 mg total) onto the skin every 3 (three) days.    Dispense:  10 patch    Refill:  2   metoCLOPramide (REGLAN) 10 MG tablet    Sig: Take 1 tablet (10 mg total) by mouth every 8 (eight) hours as needed for nausea (headache).    Dispense:  30 tablet    Refill:  2   diphenhydrAMINE (BENADRYL) 25 mg capsule    Sig: Take 1 capsule (25 mg total) by mouth every 6 (six) hours as needed (headache).    Dispense:  30 capsule    Refill:  2   miconazole (MONISTAT 7) 2 % vaginal cream    Sig: Place 1 Applicatorful vaginally at bedtime. Apply for seven nights    Dispense:  30 g    Refill:  2   miconazole (MONISTAT 7) 2 % vaginal cream    Sig: Place 1 Applicatorful vaginally at bedtime. Apply for seven nights    Dispense:  30 g    Refill:  2    Patient presents with HA, ?vaginal bleeding, pelvic pressure. HA without red flag symptoms. Treat with tylenol, reglan, benadryl. Encouraged PO hydration as well. Suspect vaginitis on exam. Wet prep, GC/C obtained. Additionally evaluate for UTI with UA. Reassuring dopplers.  1206: Reassessed patient at bedside for new RLQ pain. Colicky but severe. McBurney's point tender and Rosving's sign positive on my exam. Concern for appendicitis. CBC, CMP, MRI ordered. Yeast positive. Monistat.  1652: Patient reassessed. Notes she has to leave to pick up her child. HA resolved. Pain in RLQ has improved. She does have a mild elevation in white count 10.9 and mild anemia. Discussed that ideally she would  stay for read of MRI. However, she is HDS without signs of acute abdomen or sepsis. She notes that she would return immediately if MRI is abnormal. Close return precautions discussed, and phone number confirmed.  ASSESSMENT 1. Migraine without aura and without status migrainosus, not intractable   2. Yeast infection   3. [redacted] weeks gestation of pregnancy   4. RLQ abdominal pain     PLAN Discharge home with strict return  precautions. Allergies as of 08/22/2023   No Known Allergies      Medication List     TAKE these medications    aspirin EC 81 MG tablet Take 1 tablet (81 mg total) by mouth daily.   Diclegis 10-10 MG Tbec Generic drug: Doxylamine-Pyridoxine Take 2 tablets by mouth daily.   diphenhydrAMINE 25 mg capsule Commonly known as: BENADRYL Take 1 capsule (25 mg total) by mouth every 6 (six) hours as needed (headache).   glycopyrrolate 2 MG tablet Commonly known as: ROBINUL Take 1 tablet (2 mg total) by mouth 3 (three) times daily as needed (spitting, nausea).   metoCLOPramide 10 MG tablet Commonly known as: REGLAN Take 1 tablet (10 mg total) by mouth every 8 (eight) hours as needed for nausea (headache).   miconazole 2 % vaginal cream Commonly known as: MONISTAT 7 Place 1 Applicatorful vaginally at bedtime. Apply for seven nights   miconazole 2 % vaginal cream Commonly known as: MONISTAT 7 Place 1 Applicatorful vaginally at bedtime. Apply for seven nights   ondansetron 4 MG disintegrating tablet Commonly known as: ZOFRAN-ODT Take 1 tablet (4 mg total) by mouth every 8 (eight) hours as needed for nausea or vomiting.   pantoprazole 40 MG tablet Commonly known as: Protonix Take 1 tablet (40 mg total) by mouth daily.   PrePLUS 27-1 MG Tabs Take 1 tablet by mouth daily.   promethazine 12.5 MG suppository Commonly known as: PHENERGAN Place 1 suppository (12.5 mg total) rectally every 6 (six) hours as needed for nausea or vomiting.   promethazine 25 MG tablet Commonly known as: PHENERGAN Take 1 tablet (25 mg total) by mouth every 6 (six) hours as needed for nausea or vomiting.   scopolamine 1 MG/3DAYS Commonly known as: Transderm-Scop Place 1 patch (1.5 mg total) onto the skin every 3 (three) days.         Wylene Simmer, MD OB Fellow 08/22/2023  4:57 PM

## 2023-08-23 LAB — GC/CHLAMYDIA PROBE AMP (~~LOC~~) NOT AT ARMC
Chlamydia: NEGATIVE
Comment: NEGATIVE
Comment: NORMAL
Neisseria Gonorrhea: NEGATIVE

## 2023-08-27 ENCOUNTER — Encounter: Payer: Medicaid Other | Admitting: Family Medicine

## 2023-09-03 ENCOUNTER — Ambulatory Visit: Payer: Medicaid Other | Admitting: Clinical

## 2023-09-03 DIAGNOSIS — Z91199 Patient's noncompliance with other medical treatment and regimen due to unspecified reason: Secondary | ICD-10-CM

## 2023-09-05 NOTE — L&D Delivery Note (Signed)
 OB/GYN Faculty Practice Delivery Note  Sharon Barnett is a 33 y.o. [id number] s/p SVD at [redacted]w[redacted]d. She was admitted for IOL Severe Polyhydramnios.   ROM: 1h 78m with clear fluid GBS Status:  Negative/-- (04/27 1010) Maximum Maternal Temperature: 98.4F  Labor Progress: Initial SVE: 4.5/60/B, soft. Pt received pitocin  and AROM. She then progressed to complete.   Delivery Date/Time: 1152 5/10 Delivery: Called to room and patient was complete and pushing. Head delivered LOA. No nuchal cord present. Shoulder and body delivered in usual fashion. Infant with spontaneous cry, placed on mother's abdomen, dried and stimulated. Cord clamped x 2 after 1-minute delay, and cut by maternal sister. Cord blood drawn. Placenta delivered spontaneously with gentle cord traction. Fundus firm with massage and Pitocin . Labia, perineum, vagina, and cervix inspected without laceration.  Baby Weight: pending  Placenta: 3 vessel, intact. Sent to L&D Complications: None Lacerations: None EBL: 466 mL Analgesia: Epidural   Infant:  APGAR (1 MIN): 8  APGAR (5 MINS): 9   Ebony Goldstein, MD [facility] Family Medicine Fellow, [facility] for [facility], [facility] 01/12/2024, 1:36 PM

## 2023-09-06 ENCOUNTER — Encounter (HOSPITAL_COMMUNITY): Payer: Self-pay | Admitting: Obstetrics & Gynecology

## 2023-09-06 ENCOUNTER — Inpatient Hospital Stay (HOSPITAL_COMMUNITY)
Admission: AD | Admit: 2023-09-06 | Discharge: 2023-09-06 | Disposition: A | Discharge status: Home / Self Care | Payer: Medicaid Other | Attending: Obstetrics & Gynecology | Admitting: Obstetrics & Gynecology

## 2023-09-06 DIAGNOSIS — J069 Acute upper respiratory infection, unspecified: Secondary | ICD-10-CM | POA: Diagnosis not present

## 2023-09-06 DIAGNOSIS — O99512 Diseases of the respiratory system complicating pregnancy, second trimester: Secondary | ICD-10-CM | POA: Insufficient documentation

## 2023-09-06 DIAGNOSIS — O98512 Other viral diseases complicating pregnancy, second trimester: Secondary | ICD-10-CM | POA: Diagnosis not present

## 2023-09-06 DIAGNOSIS — O26892 Other specified pregnancy related conditions, second trimester: Secondary | ICD-10-CM | POA: Diagnosis not present

## 2023-09-06 DIAGNOSIS — Z3A17 17 weeks gestation of pregnancy: Secondary | ICD-10-CM | POA: Diagnosis not present

## 2023-09-06 DIAGNOSIS — O09899 Supervision of other high risk pregnancies, unspecified trimester: Secondary | ICD-10-CM

## 2023-09-06 DIAGNOSIS — Z3492 Encounter for supervision of normal pregnancy, unspecified, second trimester: Secondary | ICD-10-CM

## 2023-09-06 LAB — CBC WITH DIFFERENTIAL/PLATELET
Abs Immature Granulocytes: 0.13 10*3/uL — ABNORMAL HIGH (ref 0.00–0.07)
Basophils Absolute: 0 10*3/uL (ref 0.0–0.1)
Basophils Relative: 0 %
Eosinophils Absolute: 0.1 10*3/uL (ref 0.0–0.5)
Eosinophils Relative: 1 %
HCT: 29.6 % — ABNORMAL LOW (ref 36.0–46.0)
Hemoglobin: 9.5 g/dL — ABNORMAL LOW (ref 12.0–15.0)
Immature Granulocytes: 1 %
Lymphocytes Relative: 15 %
Lymphs Abs: 1.6 10*3/uL (ref 0.7–4.0)
MCH: 28.4 pg (ref 26.0–34.0)
MCHC: 32.1 g/dL (ref 30.0–36.0)
MCV: 88.6 fL (ref 80.0–100.0)
Monocytes Absolute: 0.6 10*3/uL (ref 0.1–1.0)
Monocytes Relative: 6 %
Neutro Abs: 8.2 10*3/uL — ABNORMAL HIGH (ref 1.7–7.7)
Neutrophils Relative %: 77 %
Platelets: 368 10*3/uL (ref 150–400)
RBC: 3.34 MIL/uL — ABNORMAL LOW (ref 3.87–5.11)
RDW: 13.2 % (ref 11.5–15.5)
WBC: 10.6 10*3/uL — ABNORMAL HIGH (ref 4.0–10.5)
nRBC: 0 % (ref 0.0–0.2)

## 2023-09-06 LAB — COMPREHENSIVE METABOLIC PANEL
ALT: 16 U/L (ref 0–44)
AST: 18 U/L (ref 15–41)
Albumin: 2.9 g/dL — ABNORMAL LOW (ref 3.5–5.0)
Alkaline Phosphatase: 52 U/L (ref 38–126)
Anion gap: 10 (ref 5–15)
BUN: 8 mg/dL (ref 6–20)
CO2: 21 mmol/L — ABNORMAL LOW (ref 22–32)
Calcium: 8.9 mg/dL (ref 8.9–10.3)
Chloride: 104 mmol/L (ref 98–111)
Creatinine, Ser: 0.61 mg/dL (ref 0.44–1.00)
GFR, Estimated: >60 mL/min (ref >60)
Glucose, Bld: 83 mg/dL (ref 70–99)
Potassium: 3.4 mmol/L — ABNORMAL LOW (ref 3.5–5.1)
Sodium: 135 mmol/L (ref 135–145)
Total Bilirubin: 0.6 mg/dL (ref 0.0–1.2)
Total Protein: 7 g/dL (ref 6.5–8.1)

## 2023-09-06 LAB — RESP PANEL BY RT-PCR (RSV, FLU A&B, COVID)  RVPGX2
Influenza A by PCR: NEGATIVE
Influenza B by PCR: NEGATIVE
Resp Syncytial Virus by PCR: NEGATIVE
SARS Coronavirus 2 by RT PCR: NEGATIVE

## 2023-09-06 LAB — URINALYSIS, ROUTINE W REFLEX MICROSCOPIC
Bilirubin Urine: NEGATIVE
Glucose, UA: NEGATIVE mg/dL
Hgb urine dipstick: NEGATIVE
Ketones, ur: NEGATIVE mg/dL
Nitrite: NEGATIVE
Protein, ur: NEGATIVE mg/dL
Specific Gravity, Urine: 1.026 (ref 1.005–1.030)
pH: 5 (ref 5.0–8.0)

## 2023-09-06 MED ORDER — ONDANSETRON HCL 4 MG PO TABS: 4.0000 mg | ORAL_TABLET | Freq: Three times a day (TID) | ORAL | 0 refills | Status: DC | PRN

## 2023-09-06 MED ORDER — ONDANSETRON 4 MG PO TBDP
4.0000 mg | ORAL_TABLET | Freq: Once | ORAL | Status: AC
  Administered 2023-09-06: 4 mg via ORAL
  Filled 2023-09-06: qty 1

## 2023-09-06 MED ORDER — SCOPOLAMINE 1 MG/3DAYS TD PT72
1.0000 | MEDICATED_PATCH | TRANSDERMAL | Status: DC
  Administered 2023-09-06: 1.5 mg via TRANSDERMAL
  Filled 2023-09-06: qty 1

## 2023-09-06 NOTE — Discharge Instructions (Signed)
 Safe meds to take for:   Colds/Coughs/Allergies: Benadryl  (alcohol free) 25 mg every 6 hours as needed Breath right strips Claritin Cepacol throat lozenges Chloraseptic throat spray Cold-Eeze- up to three times per day Cough drops, alcohol free Flonase (by prescription only) Guaifenesin Mucinex Robitussin DM (plain only, alcohol free) Saline nasal spray/drops Sudafed (pseudoephedrine) & Actifed ** use only after [redacted] weeks gestation and if you do not have high blood pressure Tylenol  Vicks Vaporub Zinc lozenges Zyrtec   Constipation: Colace Ducolax suppositories Fleet enema Glycerin  suppositories Metamucil Milk of magnesia Miralax Senokot Smooth move tea  Diarrhea: Kaopectate Imodium A-D  *NO pepto Bismol

## 2023-09-06 NOTE — MAU Note (Signed)
.  Sharon Barnett is a 33 y.o. at [redacted]w[redacted]d here in MAU reporting: started having cough and nasal congestion since the weekend. Started having N/V D this morning and also c/o headache.  LMP:  Onset of complaint: Sat Pain score: 4-5 Vitals:   09/06/23 1017  BP: 133/72  Pulse: 81  Resp: 18  Temp: 97.9 F (36.6 C)     FHT:142 Lab orders placed from triage: covid and resp pannel. U/A

## 2023-09-06 NOTE — MAU Provider Note (Signed)
 History     CSN: [id number]  Arrival date and time: 09/06/23 9043   Event Date/Time   First Provider Initiated Contact with Patient 09/06/2023 10:15 AM   Chief Complaint  Patient presents with   Diarrhea   Cough   Nasal Congestion    HPI  Sharon Barnett is a 33 y.o. G3P1011 at [redacted]w[redacted]d who presents to the MAU for nausea, vomiting, diarrhea, cough, and congestion. Started having cold like symptoms about 5-6 days ago, son with cough as well. She states she woke up this AM with nausea/vomiting and diarrhea, which was new. No new foods/recent travel. Has not been able to keep anything down, including water. Has not been able to take her Diclegis  secondary to nausea. Also had headache this AM. Reports some suprapubic pain w urination. Denies VB, cramping. Also endorses right lower quadrant pain, but this is not new for pt -- was evaluated for symptoms on 12/18, unchanged since then.   Past Medical History:  Diagnosis Date   ADHD (attention deficit hyperactivity disorder)    Complication of anesthesia    Hypersensitive to anesthesia (wisdom teeth)   Fibroid    Headache    Hyperemesis gravidarum    UTI (urinary tract infection)     Past Surgical History:  Procedure Laterality Date   OTHER SURGICAL HISTORY     elective abortion   WISDOM TOOTH EXTRACTION      Family History  Problem Relation Age of Onset   Depression Mother    Diabetes Mother    Hypertension Mother    Fibroids Mother    Bipolar disorder Mother    Sarcoidosis Father    Asthma Sister    Depression Sister    Hearing loss Sister    Hyperlipidemia Sister    Hypertension Sister    Miscarriages / Stillbirths Sister    Hearing loss Maternal Grandmother    Diabetes Maternal Grandmother    Hypertension Maternal Grandmother    Stroke Maternal Grandmother    Brain cancer Maternal Grandmother    Hodgkin's lymphoma Niece     Social History   Tobacco Use   Smoking status: Never   Smokeless tobacco: Never  Vaping  Use   Vaping status: Never Used  Substance Use Topics   Alcohol use: No   Drug use: Not Currently    Types: Marijuana    Comment: not since +preg    Allergies: No Known Allergies  Medications Prior to Admission  Medication Sig Dispense Refill Last Dose/Taking   aspirin  EC 81 MG tablet Take 1 tablet (81 mg total) by mouth daily. 90 tablet 3 Past Month   diphenhydrAMINE  (BENADRYL ) 25 mg capsule Take 1 capsule (25 mg total) by mouth every 6 (six) hours as needed (headache). 30 capsule 2 Past Month   glycopyrrolate  (ROBINUL ) 2 MG tablet Take 1 tablet (2 mg total) by mouth 3 (three) times daily as needed (spitting, nausea). 30 tablet 2 Past Month   metoCLOPramide  (REGLAN ) 10 MG tablet Take 1 tablet (10 mg total) by mouth every 8 (eight) hours as needed for nausea (headache). 30 tablet 2 Past Month   miconazole  (MONISTAT  7) 2 % vaginal cream Place 1 Applicatorful vaginally at bedtime. Apply for seven nights 30 g 2 Past Month   miconazole  (MONISTAT  7) 2 % vaginal cream Place 1 Applicatorful vaginally at bedtime. Apply for seven nights 30 g 2 Past Month   ondansetron  (ZOFRAN -ODT) 4 MG disintegrating tablet Take 1 tablet (4 mg total) by mouth every 8 (  eight) hours as needed for nausea or vomiting. 30 tablet 0 Past Month   pantoprazole  (PROTONIX ) 40 MG tablet Take 1 tablet (40 mg total) by mouth daily. 30 tablet 3 Past Month   promethazine  (PHENERGAN ) 25 MG tablet Take 1 tablet (25 mg total) by mouth every 6 (six) hours as needed for nausea or vomiting. 30 tablet 1 09/05/2023   scopolamine  (TRANSDERM-SCOP) 1 MG/3DAYS Place 1 patch (1.5 mg total) onto the skin every 3 (three) days. 10 patch 2 Past Week   Prenatal Vit-Fe Fumarate-FA (PREPLUS) 27-1 MG TABS Take 1 tablet by mouth daily. (Patient not taking: Reported on 07/30/2023) 30 tablet 13    promethazine  (PHENERGAN ) 12.5 MG suppository Place 1 suppository (12.5 mg total) rectally every 6 (six) hours as needed for nausea or vomiting. (Patient not taking:  Reported on 07/30/2023) 12 each 0     ROS reviewed and pertinent positives and negatives as documented in HPI.  Physical Exam   Blood pressure 99/61, pulse 89, temperature 98.1 F (36.7 C), temperature source Oral, resp. rate 18, height 5' 8 (1.727 m), weight 68.9 kg, last menstrual period 04/29/2023, SpO2 100%, unknown if currently breastfeeding.  Physical Exam Constitutional:      General: She is not in acute distress.    Appearance: Normal appearance.  HENT:     Head: Normocephalic and atraumatic.  Cardiovascular:     Rate and Rhythm: Normal rate and regular rhythm.     Heart sounds: Normal heart sounds.  Pulmonary:     Effort: Pulmonary effort is normal. No respiratory distress.     Breath sounds: Normal breath sounds.  Abdominal:     General: There is no distension.     Palpations: Abdomen is soft.     Tenderness: There is abdominal tenderness (right and left lower quadrants, no rebound, guarding, or rigidity). There is no right CVA tenderness or left CVA tenderness.  Musculoskeletal:        General: Normal range of motion.  Skin:    General: Skin is warm and dry.     Findings: No rash.  Neurological:     General: No focal deficit present.     Mental Status: She is alert and oriented to person, place, and time.     MAU Course  Procedures  MDM 33 y.o. G3P1011 at [redacted]w[redacted]d presenting for cold like symptoms and n/v/d in setting of known sick contact. Recently assessed for appendicitis and lower quadrant pain unchanged. Obtaining UA, CBC, CMP, and RSV/COVID/flu swabs to further assess.  1:28 PM  Labs reviewed -- WBC stable since last visit on 12/18, so low suspicion for new pathology causing RLQ pain, CMP wnl, UA neg, RSV/COVID/flu neg. N/v improved. Discussed results in detail w pt. Zofran  Rx'ed and given list of safe meds in pregnancy. Stable for d/c.  Assessment and Plan  Viral URI with cough - Plan: Discharge patient - Labs wnl, viral w/up neg, UA neg - Rx for Zofran   sent - Stable for d/c  Alain Sor, MD OB Fellow, Faculty Practice [facility], Center for [facility]  09/06/2023, 1:28 PM

## 2023-09-10 ENCOUNTER — Encounter (HOSPITAL_COMMUNITY): Payer: Self-pay | Admitting: Obstetrics & Gynecology

## 2023-09-10 ENCOUNTER — Inpatient Hospital Stay (HOSPITAL_COMMUNITY)
Admission: AD | Admit: 2023-09-10 | Discharge: 2023-09-10 | Disposition: A | Discharge status: Home / Self Care | Payer: Medicaid Other | Attending: Obstetrics & Gynecology | Admitting: Obstetrics & Gynecology

## 2023-09-10 ENCOUNTER — Other Ambulatory Visit: Payer: Self-pay

## 2023-09-10 ENCOUNTER — Other Ambulatory Visit (HOSPITAL_COMMUNITY): Payer: Self-pay

## 2023-09-10 DIAGNOSIS — Z3A18 18 weeks gestation of pregnancy: Secondary | ICD-10-CM | POA: Insufficient documentation

## 2023-09-10 DIAGNOSIS — O210 Mild hyperemesis gravidarum: Secondary | ICD-10-CM | POA: Diagnosis not present

## 2023-09-10 DIAGNOSIS — R1111 Vomiting without nausea: Secondary | ICD-10-CM | POA: Diagnosis not present

## 2023-09-10 DIAGNOSIS — Z79899 Other long term (current) drug therapy: Secondary | ICD-10-CM | POA: Diagnosis not present

## 2023-09-10 DIAGNOSIS — Z2839 Other underimmunization status: Secondary | ICD-10-CM

## 2023-09-10 DIAGNOSIS — O26892 Other specified pregnancy related conditions, second trimester: Secondary | ICD-10-CM | POA: Insufficient documentation

## 2023-09-10 DIAGNOSIS — R109 Unspecified abdominal pain: Secondary | ICD-10-CM | POA: Diagnosis not present

## 2023-09-10 DIAGNOSIS — Z3492 Encounter for supervision of normal pregnancy, unspecified, second trimester: Secondary | ICD-10-CM

## 2023-09-10 LAB — URINALYSIS, ROUTINE W REFLEX MICROSCOPIC
Bilirubin Urine: NEGATIVE
Glucose, UA: NEGATIVE mg/dL
Hgb urine dipstick: NEGATIVE
Ketones, ur: 80 mg/dL — AB
Nitrite: NEGATIVE
Protein, ur: 30 mg/dL — AB
Specific Gravity, Urine: 1.027 (ref 1.005–1.030)
pH: 5 (ref 5.0–8.0)

## 2023-09-10 MED ORDER — SODIUM CHLORIDE 0.9 % IV SOLN
25.0000 mg | Freq: Once | INTRAVENOUS | Status: AC
  Administered 2023-09-10: 25 mg via INTRAVENOUS
  Filled 2023-09-10: qty 1

## 2023-09-10 MED ORDER — GLYCOPYRROLATE 2 MG PO TABS
2.0000 mg | ORAL_TABLET | Freq: Three times a day (TID) | ORAL | 2 refills | Status: DC | PRN
  Filled 2023-09-10 – 2023-10-14 (×2): qty 30, 10d supply, fill #0
  Filled 2023-12-07: qty 30, 10d supply, fill #1

## 2023-09-10 MED ORDER — SCOPOLAMINE 1 MG/3DAYS TD PT72
1.0000 | MEDICATED_PATCH | Freq: Once | TRANSDERMAL | Status: DC
  Administered 2023-09-10: 1.5 mg via TRANSDERMAL
  Filled 2023-09-10: qty 1

## 2023-09-10 MED ORDER — PANTOPRAZOLE SODIUM 40 MG PO TBEC
40.0000 mg | DELAYED_RELEASE_TABLET | Freq: Every day | ORAL | 3 refills | Status: DC
  Filled 2023-09-10 – 2023-10-17 (×3): qty 30, 30d supply, fill #0
  Filled 2023-12-07: qty 30, 30d supply, fill #1

## 2023-09-10 MED ORDER — PROMETHAZINE HCL 25 MG PO TABS
25.0000 mg | ORAL_TABLET | Freq: Four times a day (QID) | ORAL | 0 refills | Status: DC | PRN
  Filled 2023-09-10: qty 30, 8d supply, fill #0

## 2023-09-10 MED ORDER — LACTATED RINGERS IV BOLUS
1000.0000 mL | Freq: Once | INTRAVENOUS | Status: AC
  Administered 2023-09-10: 1000 mL via INTRAVENOUS

## 2023-09-10 MED ORDER — ONDANSETRON 4 MG PO TBDP
4.0000 mg | ORAL_TABLET | Freq: Three times a day (TID) | ORAL | 0 refills | Status: DC | PRN
  Filled 2023-09-10: qty 30, 10d supply, fill #0

## 2023-09-10 MED ORDER — METOCLOPRAMIDE HCL 10 MG PO TABS
10.0000 mg | ORAL_TABLET | Freq: Three times a day (TID) | ORAL | 2 refills | Status: DC | PRN
  Filled 2023-09-10 – 2023-10-14 (×2): qty 30, 10d supply, fill #0
  Filled 2023-12-07: qty 30, 10d supply, fill #1

## 2023-09-10 NOTE — MAU Provider Note (Signed)
 History     CSN: [id number]  Arrival date and time: 09/10/23 1022   Event Date/Time   First Provider Initiated Contact with Patient 09/10/23 1109      Chief Complaint  Patient presents with   Emesis   Nausea   Back Pain   HPI  [name] is a 33 y.o. year old G16P1011 female at [redacted]w[redacted]d weeks gestation who presents to MAU via EMS reporting lower back pain and N/V. She was seen in MAU last Thursday for N/V, but was not able to pick up medications at the pharmacy. She has not had any N/V medicine since she was in MAU on Thursday. She reports she can't keep anything down. She also states the lower back pain is constant, but has intermittent sharp pain also, She denies  VB or LOF. She receives [facility] with [id number]); next appt is 09/20/2023.   OB History     Gravida  3   Para  1   Term  1   Preterm  0   AB  1   Living  1      SAB  0   IAB  1   Ectopic  0   Multiple  0   Live Births  1           Past Medical History:  Diagnosis Date   ADHD (attention deficit hyperactivity disorder)    Complication of anesthesia    Hypersensitive to anesthesia (wisdom teeth)   Fibroid    Headache    Hyperemesis gravidarum    UTI (urinary tract infection)     Past Surgical History:  Procedure Laterality Date   OTHER SURGICAL HISTORY     elective abortion   WISDOM TOOTH EXTRACTION      Family History  Problem Relation Age of Onset   Depression Mother    Diabetes Mother    Hypertension Mother    Fibroids Mother    Bipolar disorder Mother    Sarcoidosis Father    Asthma Sister    Depression Sister    Hearing loss Sister    Hyperlipidemia Sister    Hypertension Sister    Miscarriages / Stillbirths Sister    Hearing loss Maternal Grandmother    Diabetes Maternal Grandmother    Hypertension Maternal Grandmother    Stroke Maternal Grandmother    Brain cancer Maternal Grandmother    Hodgkin's lymphoma Niece     Social History   Tobacco  Use   Smoking status: Never   Smokeless tobacco: Never  Vaping Use   Vaping status: Never Used  Substance Use Topics   Alcohol use: No   Drug use: Not Currently    Types: Marijuana    Comment: not since +preg    Allergies: No Known Allergies  Medications Prior to Admission  Medication Sig Dispense Refill Last Dose/Taking   aspirin  EC 81 MG tablet Take 1 tablet (81 mg total) by mouth daily. 90 tablet 3    diphenhydrAMINE  (BENADRYL ) 25 mg capsule Take 1 capsule (25 mg total) by mouth every 6 (six) hours as needed (headache). 30 capsule 2    glycopyrrolate  (ROBINUL ) 2 MG tablet Take 1 tablet (2 mg total) by mouth 3 (three) times daily as needed (spitting, nausea). 30 tablet 2    metoCLOPramide  (REGLAN ) 10 MG tablet Take 1 tablet (10 mg total) by mouth every 8 (eight) hours as needed for nausea (headache). 30 tablet 2    ondansetron  (ZOFRAN ) 4 MG  tablet Take 1 tablet (4 mg total) by mouth every 8 (eight) hours as needed for nausea or vomiting. 20 tablet 0    ondansetron  (ZOFRAN -ODT) 4 MG disintegrating tablet Take 1 tablet (4 mg total) by mouth every 8 (eight) hours as needed for nausea or vomiting. 30 tablet 0    pantoprazole  (PROTONIX ) 40 MG tablet Take 1 tablet (40 mg total) by mouth daily. 30 tablet 3    Prenatal Vit-Fe Fumarate-FA (PREPLUS) 27-1 MG TABS Take 1 tablet by mouth daily. (Patient not taking: Reported on 07/30/2023) 30 tablet 13    promethazine  (PHENERGAN ) 12.5 MG suppository Place 1 suppository (12.5 mg total) rectally every 6 (six) hours as needed for nausea or vomiting. (Patient not taking: Reported on 07/30/2023) 12 each 0    promethazine  (PHENERGAN ) 25 MG tablet Take 1 tablet (25 mg total) by mouth every 6 (six) hours as needed for nausea or vomiting. 30 tablet 1    scopolamine  (TRANSDERM-SCOP) 1 MG/3DAYS Place 1 patch (1.5 mg total) onto the skin every 3 (three) days. 10 patch 2     Review of Systems  Constitutional:  Positive for appetite change and fatigue.  HENT:  Negative.    Eyes: Negative.   Respiratory: Negative.    Cardiovascular: Negative.   Gastrointestinal:  Positive for nausea and vomiting.  Endocrine: Negative.   Genitourinary: Negative.   Musculoskeletal: Negative.   Skin: Negative.   Allergic/Immunologic: Negative.   Neurological: Negative.   Hematological: Negative.   Psychiatric/Behavioral: Negative.     Physical Exam   Blood pressure 117/75, pulse (!) 108, temperature 98.3 F (36.8 C), temperature source Oral, resp. rate 19, height 5' 9 (1.753 m), weight 66.5 kg, last menstrual period 04/29/2023, SpO2 99%, unknown if currently breastfeeding.  Physical Exam Vitals and nursing note reviewed.  Constitutional:      Appearance: Normal appearance. She is normal weight.  Cardiovascular:     Rate and Rhythm: Tachycardia present.  Pulmonary:     Effort: Pulmonary effort is normal.  Musculoskeletal:        General: Normal range of motion.  Neurological:     Mental Status: She is alert and oriented to person, place, and time.  Psychiatric:        Mood and Affect: Affect is tearful.    FHTs by doppler: 142 bpm  MAU Course  Procedures  MDM CCUA LR 1 liter bolus Phenergan  25 mg IVP Scope patch x 1 PO Challenge -- tolerated well by patient  Results for orders placed or performed during the hospital encounter of 09/10/23 (from the past 48 hours)  Urinalysis, Routine w reflex microscopic -Urine, Clean Catch     Status: Abnormal   Collection Time: 09/10/23 10:50 AM  Result Value Ref Range   Color, Urine AMBER (A) YELLOW    Comment: BIOCHEMICALS MAY BE AFFECTED BY COLOR   APPearance CLOUDY (A) CLEAR   Specific Gravity, Urine 1.027 1.005 - 1.030   pH 5.0 5.0 - 8.0   Glucose, UA NEGATIVE NEGATIVE mg/dL   Hgb urine dipstick NEGATIVE NEGATIVE   Bilirubin Urine NEGATIVE NEGATIVE   Ketones, ur 80 (A) NEGATIVE mg/dL   Protein, ur 30 (A) NEGATIVE mg/dL   Nitrite NEGATIVE NEGATIVE   Leukocytes,Ua LARGE (A) NEGATIVE   RBC / HPF  0-5 0 - 5 RBC/hpf   WBC, UA 11-20 0 - 5 WBC/hpf   Bacteria, UA FEW (A) NONE SEEN   Squamous Epithelial / HPF 21-50 0 - 5 /HPF   Mucus PRESENT  Comment: Performed at [facility], [address] [place], [place] [address]    Assessment and Plan  1. Hyperemesis gravidarum - Information provided on HG  - Prescription provided by meds to beds program and brought to MAU. Rx: promethazine  (PHENERGAN ) 25 MG tablet; Take 1 tablet (25 mg total) by mouth every 6 (six) hours as needed for nausea or vomiting.  Dispense: 30 tablet; Refill: 0  2. [redacted] weeks gestation of pregnancy   - Discharge home - Keep scheduled appt with [facility] on 09/20/2023 - Patient verbalized an understanding of the plan of care and agrees.    Mcdaniel Ohms, CNM 09/10/2023, 11:09 AM

## 2023-09-10 NOTE — MAU Note (Signed)
 Sharon Barnett is a 33 y.o. at [redacted]w[redacted]d here in MAU via EMS reporting: lower back pain and N/V.  Reports she was seen in MAU last Thursday for N/V and it's continued.  Reports can't keep anything down.  Also states has lower back pain that's constant but has intermittent sharp pain.  Denies VB or LOF.    LMP: NA Onset of complaint: last week  Pain score: 9 Vitals:   09/10/23 1035  BP: 117/75  Pulse: (!) 108  Resp: 19  Temp: 98.3 F (36.8 C)  SpO2: 99%     FHT:142 bpm Lab orders placed from triage: UA

## 2023-09-14 ENCOUNTER — Ambulatory Visit: Payer: Medicaid Other | Admitting: *Deleted

## 2023-09-14 ENCOUNTER — Ambulatory Visit: Payer: Medicaid Other | Attending: Obstetrics and Gynecology | Admitting: Obstetrics and Gynecology

## 2023-09-14 ENCOUNTER — Ambulatory Visit: Payer: Medicaid Other | Attending: Family Medicine

## 2023-09-14 ENCOUNTER — Other Ambulatory Visit: Payer: Self-pay

## 2023-09-14 ENCOUNTER — Other Ambulatory Visit: Payer: Self-pay | Admitting: *Deleted

## 2023-09-14 ENCOUNTER — Other Ambulatory Visit: Payer: Self-pay | Admitting: Family Medicine

## 2023-09-14 VITALS — BP 121/75 | HR 97

## 2023-09-14 DIAGNOSIS — D259 Leiomyoma of uterus, unspecified: Secondary | ICD-10-CM

## 2023-09-14 DIAGNOSIS — Z3490 Encounter for supervision of normal pregnancy, unspecified, unspecified trimester: Secondary | ICD-10-CM

## 2023-09-14 DIAGNOSIS — Z3A12 12 weeks gestation of pregnancy: Secondary | ICD-10-CM

## 2023-09-14 DIAGNOSIS — Z3492 Encounter for supervision of normal pregnancy, unspecified, second trimester: Secondary | ICD-10-CM | POA: Diagnosis present

## 2023-09-14 DIAGNOSIS — Z362 Encounter for other antenatal screening follow-up: Secondary | ICD-10-CM

## 2023-09-14 DIAGNOSIS — D251 Intramural leiomyoma of uterus: Secondary | ICD-10-CM | POA: Diagnosis not present

## 2023-09-14 DIAGNOSIS — O26892 Other specified pregnancy related conditions, second trimester: Secondary | ICD-10-CM | POA: Diagnosis not present

## 2023-09-14 DIAGNOSIS — O3412 Maternal care for benign tumor of corpus uteri, second trimester: Secondary | ICD-10-CM

## 2023-09-14 DIAGNOSIS — O09899 Supervision of other high risk pregnancies, unspecified trimester: Secondary | ICD-10-CM | POA: Diagnosis present

## 2023-09-14 DIAGNOSIS — Z3A19 19 weeks gestation of pregnancy: Secondary | ICD-10-CM | POA: Diagnosis not present

## 2023-09-14 DIAGNOSIS — N856 Intrauterine synechiae: Secondary | ICD-10-CM

## 2023-09-14 DIAGNOSIS — Z2839 Other underimmunization status: Secondary | ICD-10-CM | POA: Insufficient documentation

## 2023-09-14 DIAGNOSIS — F419 Anxiety disorder, unspecified: Secondary | ICD-10-CM

## 2023-09-14 NOTE — Progress Notes (Signed)
 Maternal-Fetal Medicine   Name: Mozel Burdett DOB: [birth year] MRN: [id number] Referring Provider: [facility]  I had the pleasure of seeing Ms. Mantei today at the [facility]. She is G3 P1011 at 19-weeks' gestation and is here for fetal anatomy scan. On cell-free fetal DNA screening, the risks of fetal aneuploidies are not increased. Obstetrical history significant for a term vaginal delivery in 2017 of a female infant weighing 8 pounds and 2 ounces at birth. She reports no chronic medical conditions.  Blood pressure today at our office is 121/75 mmHg.   Ultrasound We performed a fetal anatomical survey.  Amniotic fluid is normal and good fetal activity seen.  Fetal biometry is consistent with the previously established dates.  No markers of aneuploidies or obvious fetal structural defects are seen. 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  Our concerns include: Myoma in pregnancy -Myomas can undergo degenerations and cause severe abdominal pain that is managed conservatively with analgesics. Patient has mild pain over the myoma. Degeneration with symptoms commonly occur in the second trimester. -Myomas can also lead to preterm delivery, growth restriction or placental abruption.  -Malpresentations, cesarean deliveries and postpaturm hemorrhages are more frequent.  -Myomas tend to regress after delivery.   Uterine Synechia -Band-like structures from previous endometrial scarring. -It is associated with good fetal outcomes in most cases. Synechia does not impair free movements of the fetus. -Rarely, it can be associated with fetal growth restriction, preterm delivery or retained placenta. If malpresentation occurs because of synechia, it can lead to cesarean delivery. -Tend to break during labor.  Recommendations -An appointment was made for her to return in 6 weeks for completion of fetal  anatomy. -Fetal growth assessment at [redacted] weeks gestation (myoma).  Thank you for consultation.  If you have any questions or concerns, please contact me the [facility].  Consultation including face-to-face (more than 50%) counseling 30 minutes.

## 2023-09-17 ENCOUNTER — Encounter: Payer: Self-pay | Admitting: Family Medicine

## 2023-09-17 DIAGNOSIS — D259 Leiomyoma of uterus, unspecified: Secondary | ICD-10-CM | POA: Insufficient documentation

## 2023-09-17 DIAGNOSIS — N856 Intrauterine synechiae: Secondary | ICD-10-CM | POA: Insufficient documentation

## 2023-09-20 ENCOUNTER — Encounter: Payer: Medicaid Other | Admitting: Advanced Practice Midwife

## 2023-09-26 ENCOUNTER — Inpatient Hospital Stay (HOSPITAL_COMMUNITY)
Admission: AD | Admit: 2023-09-26 | Discharge: 2023-09-26 | Disposition: A | Discharge status: Home / Self Care | Payer: Medicaid Other | Attending: Obstetrics and Gynecology | Admitting: Obstetrics and Gynecology

## 2023-09-26 DIAGNOSIS — O210 Mild hyperemesis gravidarum: Secondary | ICD-10-CM | POA: Insufficient documentation

## 2023-09-26 DIAGNOSIS — O99012 Anemia complicating pregnancy, second trimester: Secondary | ICD-10-CM

## 2023-09-26 DIAGNOSIS — Z3A20 20 weeks gestation of pregnancy: Secondary | ICD-10-CM

## 2023-09-26 LAB — URINALYSIS, ROUTINE W REFLEX MICROSCOPIC
Bilirubin Urine: NEGATIVE
Glucose, UA: NEGATIVE mg/dL
Hgb urine dipstick: NEGATIVE
Ketones, ur: 80 mg/dL — AB
Nitrite: NEGATIVE
Protein, ur: 30 mg/dL — AB
Specific Gravity, Urine: 1.028 (ref 1.005–1.030)
pH: 5 (ref 5.0–8.0)

## 2023-09-26 LAB — COMPREHENSIVE METABOLIC PANEL
ALT: 20 U/L (ref 0–44)
AST: 17 U/L (ref 15–41)
Albumin: 2.7 g/dL — ABNORMAL LOW (ref 3.5–5.0)
Alkaline Phosphatase: 52 U/L (ref 38–126)
Anion gap: 14 (ref 5–15)
BUN: 10 mg/dL (ref 6–20)
CO2: 19 mmol/L — ABNORMAL LOW (ref 22–32)
Calcium: 8.8 mg/dL — ABNORMAL LOW (ref 8.9–10.3)
Chloride: 103 mmol/L (ref 98–111)
Creatinine, Ser: 0.65 mg/dL (ref 0.44–1.00)
GFR, Estimated: >60 mL/min (ref >60)
Glucose, Bld: 72 mg/dL (ref 70–99)
Potassium: 3.5 mmol/L (ref 3.5–5.1)
Sodium: 136 mmol/L (ref 135–145)
Total Bilirubin: 0.6 mg/dL (ref 0.0–1.2)
Total Protein: 6.3 g/dL — ABNORMAL LOW (ref 6.5–8.1)

## 2023-09-26 LAB — CBC WITH DIFFERENTIAL/PLATELET
Abs Immature Granulocytes: 0.18 10*3/uL — ABNORMAL HIGH (ref 0.00–0.07)
Basophils Absolute: 0 10*3/uL (ref 0.0–0.1)
Basophils Relative: 0 %
Eosinophils Absolute: 0.1 10*3/uL (ref 0.0–0.5)
Eosinophils Relative: 1 %
HCT: 27.9 % — ABNORMAL LOW (ref 36.0–46.0)
Hemoglobin: 8.8 g/dL — ABNORMAL LOW (ref 12.0–15.0)
Immature Granulocytes: 2 %
Lymphocytes Relative: 14 %
Lymphs Abs: 1.3 10*3/uL (ref 0.7–4.0)
MCH: 27.8 pg (ref 26.0–34.0)
MCHC: 31.5 g/dL (ref 30.0–36.0)
MCV: 88.3 fL (ref 80.0–100.0)
Monocytes Absolute: 0.5 10*3/uL (ref 0.1–1.0)
Monocytes Relative: 5 %
Neutro Abs: 7.3 10*3/uL (ref 1.7–7.7)
Neutrophils Relative %: 78 %
Platelets: 351 10*3/uL (ref 150–400)
RBC: 3.16 MIL/uL — ABNORMAL LOW (ref 3.87–5.11)
RDW: 13.1 % (ref 11.5–15.5)
WBC: 9.3 10*3/uL (ref 4.0–10.5)
nRBC: 0 % (ref 0.0–0.2)

## 2023-09-26 LAB — LIPASE, BLOOD: Lipase: 24 U/L (ref 11–51)

## 2023-09-26 MED ORDER — IRON (FERROUS SULFATE) 325 (65 FE) MG PO TABS: 1.0000 | ORAL_TABLET | ORAL | 3 refills | Status: DC

## 2023-09-26 MED ORDER — ONDANSETRON HCL 4 MG/2ML IJ SOLN
4.0000 mg | Freq: Once | INTRAMUSCULAR | Status: AC
Start: 2023-09-26 — End: 2023-09-26
  Administered 2023-09-26: 4 mg via INTRAVENOUS
  Filled 2023-09-26: qty 2

## 2023-09-26 MED ORDER — LACTATED RINGERS IV BOLUS
1000.0000 mL | Freq: Once | INTRAVENOUS | Status: AC
  Administered 2023-09-26: 1000 mL via INTRAVENOUS

## 2023-09-26 NOTE — MAU Note (Addendum)
Sharon Barnett is a 33 y.o. at [redacted]w[redacted]d here in MAU reporting: pt appeared dizzy upon standing in the lobby and had to hold wall to stabilize herself. Reports ongoing N/V for the last 3 days. Reports her vomit is now green in color. Reports attempting to take her antiemetic meds yesterday.  Reports trying robinul, reglan, protonix, and phenergan with no relief. Has not taken anything this am. Reports lower abdominal cramping that started 2 days ago along with lower back pain. Constant cramping with no relief. Denies any VB. Denies being around anyone else that has been sick. Reports unable to keep anything down. Pt states she is unable to leave a urine sample at the moment.   LMP: n/a Onset of complaint: 3 days  Pain score: 9 Vitals:   09/26/23 0934  BP: 125/79  Pulse: (!) 102  Resp: 18  Temp: 98.2 F (36.8 C)  SpO2: 99%     FHT:141 Lab orders placed from triage:  UA

## 2023-09-26 NOTE — MAU Provider Note (Signed)
Vomiting     [name] is a 33 y.o. G58P1011 pregnant female at [redacted]w[redacted]d who presents to MAU today with complaint of vomiting.  States been going on for 3 days.  Reports bilious vomiting.  Tried taking antiemetics (robinul, reglan, protonix, phenergan) yesterday w/o relief.  Has not taken anything this morning.  Also reports cramping with no relief for last 2 days along with LBP.  Denies VB, sick contacts.  Endorses poor PO intake.    Receives care at [facility]. Prenatal records reviewed.  Pertinent items noted in HPI and remainder of comprehensive ROS otherwise negative.   O BP 125/79 (BP Location: Right Arm)   Pulse (!) 102   Temp 98.2 F (36.8 C) (Oral)   Resp 18   LMP 04/29/2023 (Within Weeks)   SpO2 100%  Physical Exam Vitals and nursing note reviewed.  Constitutional:      General: She is not in acute distress.    Appearance: She is well-developed. She is ill-appearing.  HENT:     Head: Normocephalic and atraumatic.     Mouth/Throat:     Pharynx: Oropharynx is clear.  Eyes:     Extraocular Movements: Extraocular movements intact.  Cardiovascular:     Rate and Rhythm: Tachycardia present.  Pulmonary:     Effort: Pulmonary effort is normal. No respiratory distress.  Abdominal:     General: Abdomen is flat. There is no distension.     Tenderness: There is no abdominal tenderness.  Skin:    General: Skin is warm and dry.  Neurological:     Mental Status: She is alert and oriented to person, place, and time.     Motor: No weakness.  Psychiatric:        Mood and Affect: Mood normal.        Behavior: Behavior normal.      MDM: MAU Course: Orthostatics (after 1L bolus): negative CBCdiff: no leukocytosis, Hgb 8.8* CMP: reassuring Lipase: 24 UA: collected prior to d/c   Pt able to tolerate PO prior to d/c. Stable for d/c at this time.   A/P #[redacted] weeks gestation #Anemia of pregnancy - sent with PO iron supplementation - f/u with OBGYN for OP iron infusion  consideration #Hyperemesis of pregnancy    Discharge from MAU in stable condition with strict/usual precautions Follow up at [facility] as scheduled for ongoing prenatal care  Allergies as of 09/26/2023   No Known Allergies      Medication List     TAKE these medications    aspirin EC 81 MG tablet Take 1 tablet (81 mg total) by mouth daily.   diphenhydrAMINE 25 mg capsule Commonly known as: BENADRYL Take 1 capsule (25 mg total) by mouth every 6 (six) hours as needed (headache).   glycopyrrolate 2 MG tablet Commonly known as: ROBINUL Take 1 tablet (2 mg total) by mouth 3 (three) times daily as needed (spitting, nausea).   Iron (Ferrous Sulfate) 325 (65 Fe) MG Tabs Take 1 tablet by mouth every other day.   metoCLOPramide 10 MG tablet Commonly known as: REGLAN Take 1 tablet (10 mg total) by mouth every 8 (eight) hours as needed for nausea (headache).   ondansetron 4 MG disintegrating tablet Commonly known as: ZOFRAN-ODT Take 1 tablet (4 mg total) by mouth every 8 (eight) hours as needed for nausea or vomiting.   pantoprazole 40 MG tablet Commonly known as: Protonix Take 1 tablet (40 mg total) by mouth daily.   promethazine 25 MG tablet  Commonly known as: PHENERGAN Take 1 tablet (25 mg total) by mouth every 6 (six) hours as needed for nausea or vomiting.   scopolamine 1 MG/3DAYS Commonly known as: Transderm-Scop Place 1 patch (1.5 mg total) onto the skin every 3 (three) days.        Hessie Dibble, MD 09/26/2023 12:08 PM

## 2023-09-26 NOTE — Discharge Instructions (Signed)

## 2023-10-14 ENCOUNTER — Other Ambulatory Visit: Payer: Self-pay

## 2023-10-15 ENCOUNTER — Other Ambulatory Visit (HOSPITAL_COMMUNITY): Payer: Self-pay

## 2023-10-15 ENCOUNTER — Other Ambulatory Visit: Payer: Self-pay

## 2023-10-17 ENCOUNTER — Encounter: Payer: Self-pay | Admitting: Advanced Practice Midwife

## 2023-10-17 DIAGNOSIS — O210 Mild hyperemesis gravidarum: Secondary | ICD-10-CM | POA: Insufficient documentation

## 2023-10-17 NOTE — Progress Notes (Deleted)
   PRENATAL VISIT NOTE  Subjective:  Sharon Barnett is a 33 y.o. G3P1011 at [redacted]w[redacted]d being seen today for ongoing prenatal care.  She is currently monitored for the following issues for this {Blank single:19197::"high-risk","low-risk"} pregnancy and has Migraine; Supervision of low-risk pregnancy; Rubella non-immune status, antepartum; HGSIL (high grade squamous intraepithelial lesion) on Pap smear of cervix; Fibroid uterus; Uterine synechiae; and Hyperemesis gravidarum on their problem list.  Patient reports {sx:14538}.   .  .   . Denies leaking of fluid.   The following portions of the patient's history were reviewed and updated as appropriate: allergies, current medications, past family history, past medical history, past social history, past surgical history and problem list.   Objective:  There were no vitals filed for this visit.  Fetal Status:           General:  Alert, oriented and cooperative. Patient is in no acute distress.  Skin: Skin is warm and dry. No rash noted.   Cardiovascular: Normal heart rate noted  Respiratory: Normal respiratory effort, no problems with respiration noted  Abdomen: Soft, gravid, appropriate for gestational age.        Pelvic: {Blank single:19197::"Cervical exam performed in the presence of a chaperone","Cervical exam deferred"}        Extremities: Normal range of motion.     Mental Status: Normal mood and affect. Normal behavior. Normal judgment and thought content.   Assessment and Plan:  Pregnancy: G3P1011 at [redacted]w[redacted]d 1. Encounter for supervision of low-risk pregnancy in second trimester (Primary) ***  2. Rubella non-immune status, antepartum ***  3. HGSIL (high grade squamous intraepithelial lesion) on Pap smear of cervix ***  4. [redacted] weeks gestation of pregnancy ***  5. Uterine synechiae ***  6. Hyperemesis gravidarum ***  {Blank single:19197::"Term","Preterm"} labor symptoms and general obstetric precautions including but not limited  to vaginal bleeding, contractions, leaking of fluid and fetal movement were reviewed in detail with the patient. Please refer to After Visit Summary for other counseling recommendations.   No follow-ups on file.  Future Appointments  Date Time Provider Department Center  10/18/2023  9:00 AM CENTERING PROVIDER [facility]  10/26/2023 11:30 AM [facility] US[facility]  11/15/2023  9:00 AM CENTERING PROVIDER [facility]  11/29/2023  9:00 AM CENTERING PROVIDER [facility]  12/13/2023  9:00 AM CENTERING PROVIDER [facility]  12/27/2023  9:00 AM CENTERING PROVIDER [facility]  01/10/2024  9:00 AM CENTERING PROVIDER [facility]  01/24/2024  9:00 AM CENTERING PROVIDER [facility]  02/07/2024  9:00 AM CENTERING PROVIDER [facility]    Dorathy Kinsman, CNM

## 2023-10-18 ENCOUNTER — Encounter: Payer: Medicaid Other | Admitting: Advanced Practice Midwife

## 2023-10-18 DIAGNOSIS — O210 Mild hyperemesis gravidarum: Secondary | ICD-10-CM

## 2023-10-18 DIAGNOSIS — N856 Intrauterine synechiae: Secondary | ICD-10-CM

## 2023-10-18 DIAGNOSIS — Z3492 Encounter for supervision of normal pregnancy, unspecified, second trimester: Secondary | ICD-10-CM

## 2023-10-18 DIAGNOSIS — R87613 High grade squamous intraepithelial lesion on cytologic smear of cervix (HGSIL): Secondary | ICD-10-CM

## 2023-10-18 DIAGNOSIS — Z2839 Other underimmunization status: Secondary | ICD-10-CM

## 2023-10-18 DIAGNOSIS — Z3A23 23 weeks gestation of pregnancy: Secondary | ICD-10-CM

## 2023-10-21 ENCOUNTER — Inpatient Hospital Stay (HOSPITAL_COMMUNITY)
Admission: AD | Admit: 2023-10-21 | Discharge: 2023-10-21 | Disposition: A | Discharge status: Home / Self Care | Payer: Medicaid Other | Attending: Obstetrics & Gynecology | Admitting: Obstetrics & Gynecology

## 2023-10-21 ENCOUNTER — Encounter (HOSPITAL_COMMUNITY): Payer: Self-pay | Admitting: Obstetrics & Gynecology

## 2023-10-21 ENCOUNTER — Other Ambulatory Visit: Payer: Self-pay

## 2023-10-21 DIAGNOSIS — D509 Iron deficiency anemia, unspecified: Secondary | ICD-10-CM | POA: Diagnosis not present

## 2023-10-21 DIAGNOSIS — O210 Mild hyperemesis gravidarum: Secondary | ICD-10-CM | POA: Insufficient documentation

## 2023-10-21 DIAGNOSIS — R1010 Upper abdominal pain, unspecified: Secondary | ICD-10-CM | POA: Diagnosis present

## 2023-10-21 DIAGNOSIS — O26892 Other specified pregnancy related conditions, second trimester: Secondary | ICD-10-CM | POA: Diagnosis present

## 2023-10-21 DIAGNOSIS — O99012 Anemia complicating pregnancy, second trimester: Secondary | ICD-10-CM | POA: Insufficient documentation

## 2023-10-21 DIAGNOSIS — M549 Dorsalgia, unspecified: Secondary | ICD-10-CM | POA: Diagnosis present

## 2023-10-21 DIAGNOSIS — Z3A24 24 weeks gestation of pregnancy: Secondary | ICD-10-CM | POA: Diagnosis not present

## 2023-10-21 DIAGNOSIS — R1084 Generalized abdominal pain: Secondary | ICD-10-CM | POA: Diagnosis not present

## 2023-10-21 LAB — CBC
HCT: 26 % — ABNORMAL LOW (ref 36.0–46.0)
Hemoglobin: 8.1 g/dL — ABNORMAL LOW (ref 12.0–15.0)
MCH: 26.7 pg (ref 26.0–34.0)
MCHC: 31.2 g/dL (ref 30.0–36.0)
MCV: 85.8 fL (ref 80.0–100.0)
Platelets: 328 10*3/uL (ref 150–400)
RBC: 3.03 MIL/uL — ABNORMAL LOW (ref 3.87–5.11)
RDW: 13.2 % (ref 11.5–15.5)
WBC: 8.7 10*3/uL (ref 4.0–10.5)
nRBC: 0 % (ref 0.0–0.2)

## 2023-10-21 LAB — COMPREHENSIVE METABOLIC PANEL
ALT: 9 U/L (ref 0–44)
AST: 14 U/L — ABNORMAL LOW (ref 15–41)
Albumin: 2.5 g/dL — ABNORMAL LOW (ref 3.5–5.0)
Alkaline Phosphatase: 55 U/L (ref 38–126)
Anion gap: 11 (ref 5–15)
BUN: 6 mg/dL (ref 6–20)
CO2: 21 mmol/L — ABNORMAL LOW (ref 22–32)
Calcium: 8.5 mg/dL — ABNORMAL LOW (ref 8.9–10.3)
Chloride: 105 mmol/L (ref 98–111)
Creatinine, Ser: 0.61 mg/dL (ref 0.44–1.00)
GFR, Estimated: >60 mL/min (ref >60)
Glucose, Bld: 92 mg/dL (ref 70–99)
Potassium: 3.3 mmol/L — ABNORMAL LOW (ref 3.5–5.1)
Sodium: 137 mmol/L (ref 135–145)
Total Bilirubin: 0.4 mg/dL (ref 0.0–1.2)
Total Protein: 6.1 g/dL — ABNORMAL LOW (ref 6.5–8.1)

## 2023-10-21 LAB — URINALYSIS, ROUTINE W REFLEX MICROSCOPIC
Bilirubin Urine: NEGATIVE
Glucose, UA: NEGATIVE mg/dL
Hgb urine dipstick: NEGATIVE
Ketones, ur: NEGATIVE mg/dL
Leukocytes,Ua: NEGATIVE
Nitrite: NEGATIVE
Protein, ur: 30 mg/dL — AB
Specific Gravity, Urine: 1.028 (ref 1.005–1.030)
pH: 5 (ref 5.0–8.0)

## 2023-10-21 MED ORDER — FAMOTIDINE IN NACL 20-0.9 MG/50ML-% IV SOLN
20.0000 mg | Freq: Once | INTRAVENOUS | Status: AC
  Administered 2023-10-21: 20 mg via INTRAVENOUS
  Filled 2023-10-21: qty 50

## 2023-10-21 MED ORDER — LACTATED RINGERS IV BOLUS
1000.0000 mL | Freq: Once | INTRAVENOUS | Status: AC
  Administered 2023-10-21: 1000 mL via INTRAVENOUS

## 2023-10-21 MED ORDER — CYCLOBENZAPRINE HCL 10 MG PO TABS: 10.0000 mg | ORAL_TABLET | Freq: Three times a day (TID) | ORAL | 0 refills | Status: DC | PRN

## 2023-10-21 MED ORDER — IRON (FERROUS SULFATE) 325 (65 FE) MG PO TABS: 1.0000 | ORAL_TABLET | Freq: Every day | ORAL | 3 refills | Status: DC

## 2023-10-21 MED ORDER — ACETAMINOPHEN 500 MG PO TABS
1000.0000 mg | ORAL_TABLET | Freq: Once | ORAL | Status: AC
Start: 2023-10-21 — End: 2023-10-21
  Administered 2023-10-21: 1000 mg via ORAL
  Filled 2023-10-21: qty 2

## 2023-10-21 MED ORDER — FAMOTIDINE 20 MG PO TABS: 20.0000 mg | ORAL_TABLET | Freq: Two times a day (BID) | ORAL | 0 refills | Status: DC

## 2023-10-21 MED ORDER — ONDANSETRON HCL 4 MG/2ML IJ SOLN
4.0000 mg | Freq: Once | INTRAMUSCULAR | Status: AC
  Administered 2023-10-21: 4 mg via INTRAVENOUS
  Filled 2023-10-21: qty 2

## 2023-10-21 MED ORDER — CYCLOBENZAPRINE HCL 5 MG PO TABS
10.0000 mg | ORAL_TABLET | Freq: Once | ORAL | Status: AC
Start: 2023-10-21 — End: 2023-10-21
  Administered 2023-10-21: 10 mg via ORAL
  Filled 2023-10-21: qty 2

## 2023-10-21 MED ORDER — FERROUS SULFATE 325 (65 FE) MG PO TABS: 325.0000 mg | ORAL_TABLET | Freq: Every day | ORAL | 0 refills | Status: DC

## 2023-10-21 NOTE — Discharge Instructions (Signed)

## 2023-10-21 NOTE — MAU Provider Note (Signed)
 Chief Complaint:  Abdominal Pain and Back Pain   HPI   Event Date/Time   First Provider Initiated Contact with Patient 10/21/23 2102      Sharon Barnett is a 33 y.o. G3P1011 at [redacted]w[redacted]d who presents to maternity admissions reporting upper abdominal pain and pressure, inability to tolerate PO food fluids today tender mild upper gastric. She has also been complaining of generalized not feeling well. Denies VB, LOF, and reports Fetal Movements  Pregnancy Course: [facility]  Past Medical History:  Diagnosis Date   ADHD (attention deficit hyperactivity disorder)    Complication of anesthesia    Hypersensitive to anesthesia (wisdom teeth)   Fibroid    Headache    Hyperemesis gravidarum    UTI (urinary tract infection)    OB History  Gravida Para Term Preterm AB Living  3 1 1  0 1 1  SAB IAB Ectopic Multiple Live Births  0 1 0 0 1    # Outcome Date GA Lbr Len/2nd Weight Sex Type Anes PTL Lv  3 Current           2 IAB 07/25/22 [redacted]w[redacted]d         1 Term 07/18/16 [redacted]w[redacted]d  3680 g M Vag-Spont   LIV   Past Surgical History:  Procedure Laterality Date   OTHER SURGICAL HISTORY     elective abortion   WISDOM TOOTH EXTRACTION     Family History  Problem Relation Age of Onset   Depression Mother    Diabetes Mother    Hypertension Mother    Fibroids Mother    Bipolar disorder Mother    Sarcoidosis Father    Asthma Sister    Depression Sister    Hearing loss Sister    Hyperlipidemia Sister    Hypertension Sister    Miscarriages / Stillbirths Sister    Hearing loss Maternal Grandmother    Diabetes Maternal Grandmother    Hypertension Maternal Grandmother    Stroke Maternal Grandmother    Brain cancer Maternal Grandmother    Hodgkin's lymphoma Niece    Social History   Tobacco Use   Smoking status: Never   Smokeless tobacco: Never  Vaping Use   Vaping status: Never Used  Substance Use Topics   Alcohol use: No   Drug use: Not Currently    Types: Marijuana    Comment: not since +preg    No Known Allergies No medications prior to admission.    I have reviewed patient's Past Medical Hx, Surgical Hx, Family Hx, Social Hx, medications and allergies.   ROS  Pertinent items noted in HPI and remainder of comprehensive ROS otherwise negative.   PHYSICAL EXAM  Patient Vitals for the past 24 hrs:  BP Temp Temp src Pulse Resp SpO2  10/21/23 2353 (!) 108/59 -- -- 72 -- --  10/21/23 2020 (!) 107/59 -- -- 71 -- 97 %  10/21/23 2009 116/69 98.3 F (36.8 C) Oral 72 19 --    Constitutional: Well-developed, well-nourished female in no acute distress.  Cardiovascular: normal rate & rhythm, warm and well-perfused Respiratory: normal effort, no problems with respiration noted GI: Abd soft, mid upper gastric tenderness noted on exam, gravid, No rebound, no rigidity, no guarding MS: Extremities nontender, no edema, normal ROM Neurologic: Alert and oriented x 4.  GU: no CVA tenderness Pelvic: Deferred     Fetal Tracing: Cat 1 reactive for GA Baseline: 130 Variability:moderate  Accelerations: present Decelerations: absent Toco: irratability   Labs: Results for orders placed or  performed during the hospital encounter of 10/21/23 (from the past 24 hours)  CBC     Status: Abnormal   Collection Time: 10/21/23  9:51 PM  Result Value Ref Range   WBC 8.7 4.0 - 10.5 K/uL   RBC 3.03 (L) 3.87 - 5.11 MIL/uL   Hemoglobin 8.1 (L) 12.0 - 15.0 g/dL   HCT 40.9 (L) 81.1 - 91.4 %   MCV 85.8 80.0 - 100.0 fL   MCH 26.7 26.0 - 34.0 pg   MCHC 31.2 30.0 - 36.0 g/dL   RDW 78.2 95.6 - 21.3 %   Platelets 328 150 - 400 K/uL   nRBC 0.0 0.0 - 0.2 %  Comprehensive metabolic panel     Status: Abnormal   Collection Time: 10/21/23  9:51 PM  Result Value Ref Range   Sodium 137 135 - 145 mmol/L   Potassium 3.3 (L) 3.5 - 5.1 mmol/L   Chloride 105 98 - 111 mmol/L   CO2 21 (L) 22 - 32 mmol/L   Glucose, Bld 92 70 - 99 mg/dL   BUN 6 6 - 20 mg/dL   Creatinine, Ser 0.86 0.44 - 1.00 mg/dL   Calcium 8.5  (L) 8.9 - 10.3 mg/dL   Total Protein 6.1 (L) 6.5 - 8.1 g/dL   Albumin 2.5 (L) 3.5 - 5.0 g/dL   AST 14 (L) 15 - 41 U/L   ALT 9 0 - 44 U/L   Alkaline Phosphatase 55 38 - 126 U/L   Total Bilirubin 0.4 0.0 - 1.2 mg/dL   GFR, Estimated >57 >84 mL/min   Anion gap 11 5 - 15  Urinalysis, Routine w reflex microscopic -Urine, Random     Status: Abnormal   Collection Time: 10/21/23 11:13 PM  Result Value Ref Range   Color, Urine YELLOW YELLOW   APPearance HAZY (A) CLEAR   Specific Gravity, Urine 1.028 1.005 - 1.030   pH 5.0 5.0 - 8.0   Glucose, UA NEGATIVE NEGATIVE mg/dL   Hgb urine dipstick NEGATIVE NEGATIVE   Bilirubin Urine NEGATIVE NEGATIVE   Ketones, ur NEGATIVE NEGATIVE mg/dL   Protein, ur 30 (A) NEGATIVE mg/dL   Nitrite NEGATIVE NEGATIVE   Leukocytes,Ua NEGATIVE NEGATIVE   RBC / HPF 0-5 0 - 5 RBC/hpf   WBC, UA 0-5 0 - 5 WBC/hpf   Bacteria, UA RARE (A) NONE SEEN   Squamous Epithelial / HPF 11-20 0 - 5 /HPF   Mucus PRESENT     Imaging:  No results found.  MDM & MAU COURSE  MDM:  HIGH  NST Labs  Abdominal w/up IVF  Medications as ordered below and RX sent at discharge Reevaluation after medication administered and IVF  0/10 pain while lying down . I personally observed patient sleeping comfortably in the bed.   I have reviewed the patient chart and performed the physical exam . I have ordered & interpreted the lab results and reviewed and interpreted the NST Medications ordered as stated below.  A/P as described below.  Counseling and education provided and patient agreeable  with plan as described below. Verbalized understanding.    MAU Course: Orders Placed This Encounter  Procedures   CBC   Comprehensive metabolic panel   Urinalysis, Routine w reflex microscopic -Urine, Random   Discharge patient Discharge disposition: 01-Home or Self Care; Discharge patient date: 10/21/2023   Meds ordered this encounter  Medications   lactated ringers bolus 1,000 mL    famotidine (PEPCID) IVPB 20 mg premix   acetaminophen (TYLENOL)  tablet 1,000 mg   cyclobenzaprine (FLEXERIL) tablet 10 mg   ondansetron (ZOFRAN) injection 4 mg   cyclobenzaprine (FLEXERIL) 10 MG tablet    Sig: Take 1 tablet (10 mg total) by mouth 3 (three) times daily as needed for muscle spasms.    Dispense:  20 tablet    Refill:  0    Supervising Provider:   Reva Bores [[id number]]   famotidine (PEPCID) 20 MG tablet    Sig: Take 1 tablet (20 mg total) by mouth 2 (two) times daily.    Dispense:  30 tablet    Refill:  0    Supervising Provider:   Reva Bores [[id number]]   ferrous sulfate 325 (65 FE) MG tablet    Sig: Take 1 tablet (325 mg total) by mouth daily.    Dispense:  30 tablet    Refill:  0    Supervising Provider:   Reva Bores [[id number]]   Iron, Ferrous Sulfate, 325 (65 Fe) MG TABS    Sig: Take 1 tablet by mouth daily.    Dispense:  30 tablet    Refill:  3    Supervising Provider:   Reva Bores [[id number]]    ASSESSMENT   1. Generalized abdominal pain   2. Hyperemesis affecting pregnancy, antepartum   3. [redacted] weeks gestation of pregnancy   4. Iron deficiency anemia, unspecified iron deficiency anemia type     PLAN  Discharge home in stable condition with return precautions.  OB f/U as scheduled   Follow-up Information     [facility] Follow up.   Specialty: Obstetrics and Gynecology Why: If symptoms worsen or fail to resolve Contact information: [address] [facility] [address] [phone]                Allergies as of 10/21/2023   No Known Allergies      Medication List     TAKE these medications    aspirin EC 81 MG tablet Take 1 tablet (81 mg total) by mouth daily.   cyclobenzaprine 10 MG tablet Commonly known as: FLEXERIL Take 1 tablet (10 mg total) by mouth 3 (three) times daily as needed for muscle spasms.   diphenhydrAMINE 25 mg capsule Commonly known as:  BENADRYL Take 1 capsule (25 mg total) by mouth every 6 (six) hours as needed (headache).   famotidine 20 MG tablet Commonly known as: Pepcid Take 1 tablet (20 mg total) by mouth 2 (two) times daily.   ferrous sulfate 325 (65 FE) MG tablet Take 1 tablet (325 mg total) by mouth daily. What changed: You were already taking a medication with the same name, and this prescription was added. Make sure you understand how and when to take each.   Iron (Ferrous Sulfate) 325 (65 Fe) MG Tabs Take 1 tablet by mouth daily. What changed: when to take this   glycopyrrolate 2 MG tablet Commonly known as: ROBINUL Take 1 tablet (2 mg total) by mouth 3 (three) times daily as needed (spitting, nausea).   metoCLOPramide 10 MG tablet Commonly known as: REGLAN Take 1 tablet (10 mg total) by mouth every 8 (eight) hours as needed for nausea (headache).   ondansetron 4 MG disintegrating tablet Commonly known as: ZOFRAN-ODT Take 1 tablet (4 mg total) by mouth every 8 (eight) hours as needed for nausea or vomiting.   pantoprazole 40 MG tablet Commonly known as: Protonix Take 1 tablet (40 mg total)  by mouth daily.   promethazine 25 MG tablet Commonly known as: PHENERGAN Take 1 tablet (25 mg total) by mouth every 6 (six) hours as needed for nausea or vomiting.   scopolamine 1 MG/3DAYS Commonly known as: Transderm-Scop Place 1 patch (1.5 mg total) onto the skin every 3 (three) days.        Marcell Barlow, MSN, [facility], [facility] [employer]

## 2023-10-21 NOTE — MAU Note (Signed)
 Sharon Barnett is a 33 y.o. at [redacted]w[redacted]d here in MAU reporting: she reports abdominal cramping that radiates to her back for the past few days, however, she reports it has gotten significantly worse today. She reports pelvic pain when she walks as well. Denies VB and LOF. Endorses +FM    Pain score: 10/10 abdomen  9/10 pelvic pain Vitals:   10/21/23 2009  BP: 116/69  Pulse: 72  Resp: 19  Temp: 98.3 F (36.8 C)     FHT: 135  Lab orders placed from triage: none

## 2023-10-26 ENCOUNTER — Ambulatory Visit: Payer: Medicaid Other | Attending: Obstetrics and Gynecology

## 2023-10-26 ENCOUNTER — Other Ambulatory Visit: Payer: Self-pay | Admitting: *Deleted

## 2023-10-26 VITALS — BP 116/64

## 2023-10-26 DIAGNOSIS — Z3A25 25 weeks gestation of pregnancy: Secondary | ICD-10-CM | POA: Diagnosis not present

## 2023-10-26 DIAGNOSIS — O09899 Supervision of other high risk pregnancies, unspecified trimester: Secondary | ICD-10-CM | POA: Diagnosis not present

## 2023-10-26 DIAGNOSIS — Z3492 Encounter for supervision of normal pregnancy, unspecified, second trimester: Secondary | ICD-10-CM

## 2023-10-26 DIAGNOSIS — O402XX0 Polyhydramnios, second trimester, not applicable or unspecified: Secondary | ICD-10-CM | POA: Diagnosis not present

## 2023-10-26 DIAGNOSIS — D259 Leiomyoma of uterus, unspecified: Secondary | ICD-10-CM

## 2023-10-26 DIAGNOSIS — O3412 Maternal care for benign tumor of corpus uteri, second trimester: Secondary | ICD-10-CM | POA: Diagnosis not present

## 2023-10-26 DIAGNOSIS — N856 Intrauterine synechiae: Secondary | ICD-10-CM

## 2023-10-26 DIAGNOSIS — Z363 Encounter for antenatal screening for malformations: Secondary | ICD-10-CM | POA: Insufficient documentation

## 2023-10-26 DIAGNOSIS — O34592 Maternal care for other abnormalities of gravid uterus, second trimester: Secondary | ICD-10-CM | POA: Diagnosis not present

## 2023-10-26 DIAGNOSIS — O3410 Maternal care for benign tumor of corpus uteri, unspecified trimester: Secondary | ICD-10-CM | POA: Diagnosis present

## 2023-10-26 DIAGNOSIS — Z362 Encounter for other antenatal screening follow-up: Secondary | ICD-10-CM

## 2023-10-26 DIAGNOSIS — Z2839 Other underimmunization status: Secondary | ICD-10-CM | POA: Diagnosis not present

## 2023-10-26 DIAGNOSIS — Z3490 Encounter for supervision of normal pregnancy, unspecified, unspecified trimester: Secondary | ICD-10-CM

## 2023-10-26 NOTE — Progress Notes (Unsigned)
 Marland Kitchen

## 2023-11-05 ENCOUNTER — Other Ambulatory Visit: Payer: Self-pay | Admitting: Nurse Practitioner

## 2023-11-05 ENCOUNTER — Other Ambulatory Visit (HOSPITAL_COMMUNITY): Payer: Self-pay

## 2023-11-05 ENCOUNTER — Telehealth: Payer: Self-pay | Admitting: Family Medicine

## 2023-11-05 MED ORDER — CYCLOBENZAPRINE HCL 10 MG PO TABS
10.0000 mg | ORAL_TABLET | Freq: Three times a day (TID) | ORAL | 0 refills | Status: DC | PRN
  Filled 2023-11-05: qty 20, 7d supply, fill #0

## 2023-11-05 NOTE — Telephone Encounter (Signed)
 Patient no longer want to be apart of [facility]

## 2023-11-08 ENCOUNTER — Encounter: Admitting: Family Medicine

## 2023-11-15 ENCOUNTER — Inpatient Hospital Stay (HOSPITAL_COMMUNITY)
Admission: AD | Admit: 2023-11-15 | Discharge: 2023-11-16 | Disposition: A | Discharge status: Home / Self Care | Attending: Obstetrics and Gynecology | Admitting: Obstetrics and Gynecology

## 2023-11-15 ENCOUNTER — Encounter (HOSPITAL_COMMUNITY): Payer: Self-pay | Admitting: Obstetrics and Gynecology

## 2023-11-15 DIAGNOSIS — O26893 Other specified pregnancy related conditions, third trimester: Secondary | ICD-10-CM | POA: Diagnosis not present

## 2023-11-15 DIAGNOSIS — O99283 Endocrine, nutritional and metabolic diseases complicating pregnancy, third trimester: Secondary | ICD-10-CM | POA: Insufficient documentation

## 2023-11-15 DIAGNOSIS — E860 Dehydration: Secondary | ICD-10-CM | POA: Insufficient documentation

## 2023-11-15 DIAGNOSIS — R102 Pelvic and perineal pain: Secondary | ICD-10-CM | POA: Diagnosis present

## 2023-11-15 DIAGNOSIS — Z3A28 28 weeks gestation of pregnancy: Secondary | ICD-10-CM | POA: Insufficient documentation

## 2023-11-15 DIAGNOSIS — O211 Hyperemesis gravidarum with metabolic disturbance: Secondary | ICD-10-CM | POA: Insufficient documentation

## 2023-11-15 DIAGNOSIS — O210 Mild hyperemesis gravidarum: Secondary | ICD-10-CM | POA: Diagnosis not present

## 2023-11-15 DIAGNOSIS — O09899 Supervision of other high risk pregnancies, unspecified trimester: Secondary | ICD-10-CM

## 2023-11-15 DIAGNOSIS — O4703 False labor before 37 completed weeks of gestation, third trimester: Secondary | ICD-10-CM | POA: Diagnosis not present

## 2023-11-15 DIAGNOSIS — N949 Unspecified condition associated with female genital organs and menstrual cycle: Secondary | ICD-10-CM | POA: Diagnosis not present

## 2023-11-15 LAB — FETAL FIBRONECTIN: Fetal Fibronectin: NEGATIVE

## 2023-11-15 LAB — COMPREHENSIVE METABOLIC PANEL
ALT: 11 U/L (ref 0–44)
AST: 19 U/L (ref 15–41)
Albumin: 2.6 g/dL — ABNORMAL LOW (ref 3.5–5.0)
Alkaline Phosphatase: 79 U/L (ref 38–126)
Anion gap: 11 (ref 5–15)
BUN: 7 mg/dL (ref 6–20)
CO2: 21 mmol/L — ABNORMAL LOW (ref 22–32)
Calcium: 8.6 mg/dL — ABNORMAL LOW (ref 8.9–10.3)
Chloride: 103 mmol/L (ref 98–111)
Creatinine, Ser: 0.58 mg/dL (ref 0.44–1.00)
GFR, Estimated: >60 mL/min (ref >60)
Glucose, Bld: 74 mg/dL (ref 70–99)
Potassium: 3.3 mmol/L — ABNORMAL LOW (ref 3.5–5.1)
Sodium: 135 mmol/L (ref 135–145)
Total Bilirubin: 0.7 mg/dL (ref 0.0–1.2)
Total Protein: 6.5 g/dL (ref 6.5–8.1)

## 2023-11-15 LAB — URINALYSIS, ROUTINE W REFLEX MICROSCOPIC
Bilirubin Urine: NEGATIVE
Glucose, UA: NEGATIVE mg/dL
Hgb urine dipstick: NEGATIVE
Ketones, ur: 80 mg/dL — AB
Nitrite: NEGATIVE
Protein, ur: 30 mg/dL — AB
Specific Gravity, Urine: 1.027 (ref 1.005–1.030)
pH: 6 (ref 5.0–8.0)

## 2023-11-15 LAB — LIPASE, BLOOD: Lipase: 24 U/L (ref 11–51)

## 2023-11-15 MED ORDER — NIFEDIPINE 10 MG PO CAPS
10.0000 mg | ORAL_CAPSULE | ORAL | Status: DC | PRN
  Administered 2023-11-15 (×2): 10 mg via ORAL
  Filled 2023-11-15 (×2): qty 1

## 2023-11-15 MED ORDER — LACTATED RINGERS IV SOLN: Freq: Once | INTRAVENOUS | Status: AC

## 2023-11-15 MED ORDER — ONDANSETRON HCL 4 MG/2ML IJ SOLN
4.0000 mg | Freq: Once | INTRAMUSCULAR | Status: AC
  Administered 2023-11-15: 4 mg via INTRAVENOUS
  Filled 2023-11-15: qty 2

## 2023-11-15 MED ORDER — OXYCODONE-ACETAMINOPHEN 5-325 MG PO TABS
1.0000 | ORAL_TABLET | Freq: Four times a day (QID) | ORAL | Status: DC | PRN
  Administered 2023-11-15: 1 via ORAL
  Filled 2023-11-15: qty 1

## 2023-11-15 NOTE — MAU Note (Signed)
 In to give 2nd procardia and b/p 101 61. OK to proceed with procardia per Wynelle Bourgeois CNM

## 2023-11-15 NOTE — Progress Notes (Signed)
 Pt states she feels much better. Baby is very active. Transducer adj

## 2023-11-15 NOTE — MAU Provider Note (Signed)
 Chief Complaint:  Abdominal Pain   Event Date/Time   First Provider Initiated Contact with Patient 11/15/23 2049     HPI: Sharon Barnett is a 33 y.o. G3P1011 at 75w6dwho presents to maternity admissions reporting intermittent sharp pains in lower pelvis.  Also having tenderness over left round ligament.  Still struggling with nausea and vomiting. Has multiple meds at home but only took Zofran once this morning.  . She reports good fetal movement, denies LOF, vaginal bleeding, vaginal itching/burning, urinary symptoms, h/a, dizziness, n/v, diarrhea, constipation or fever/chills.  She denies headache, visual changes or RUQ abdominal pain.  Abdominal Pain This is a new problem. The current episode started today. The quality of the pain is cramping. The abdominal pain does not radiate. Associated symptoms include vomiting. Pertinent negatives include no diarrhea, dysuria, fever, frequency or myalgias. Nothing aggravates the pain. The pain is relieved by Nothing. She has tried nothing for the symptoms.  Emesis  This is a recurrent problem. There has been no fever. Associated symptoms include abdominal pain. Pertinent negatives include no diarrhea, fever or myalgias. Treatments tried: zofran earlier this morning (12 hrs ago)   RN Note Sharon Barnett is a 33 y.o. at [redacted]w[redacted]d here in MAU reporting pain in her private area. Unable to keep down anything since Tues which is not new for her. Some abdominal pressure but no pain. H/A.  Reports good FM and denies VB. Reports having green vag d/c today. Tried Zofran this am but not helping with n/v. States it hurts a lot between her legs when she walks   Past Medical History: Past Medical History:  Diagnosis Date   ADHD (attention deficit hyperactivity disorder)    Complication of anesthesia    Hypersensitive to anesthesia (wisdom teeth)   Fibroid    Headache    Hyperemesis gravidarum    UTI (urinary tract infection)     Past obstetric history: OB  History  Gravida Para Term Preterm AB Living  3 1 1  0 1 1  SAB IAB Ectopic Multiple Live Births  0 1 0 0 1    # Outcome Date GA Lbr Len/2nd Weight Sex Type Anes PTL Lv  3 Current           2 IAB 07/25/22 [redacted]w[redacted]d         1 Term 07/18/16 [redacted]w[redacted]d  3680 g M Vag-Spont   LIV    Past Surgical History: Past Surgical History:  Procedure Laterality Date   OTHER SURGICAL HISTORY     elective abortion   WISDOM TOOTH EXTRACTION      Family History: Family History  Problem Relation Age of Onset   Depression Mother    Diabetes Mother    Hypertension Mother    Fibroids Mother    Bipolar disorder Mother    Sarcoidosis Father    Asthma Sister    Depression Sister    Hearing loss Sister    Hyperlipidemia Sister    Hypertension Sister    Miscarriages / Stillbirths Sister    Hearing loss Maternal Grandmother    Diabetes Maternal Grandmother    Hypertension Maternal Grandmother    Stroke Maternal Grandmother    Brain cancer Maternal Grandmother    Hodgkin's lymphoma Niece     Social History: Social History   Tobacco Use   Smoking status: Never   Smokeless tobacco: Never  Vaping Use   Vaping status: Never Used  Substance Use Topics   Alcohol use: No   Drug use:  Not Currently    Types: Marijuana    Comment: not since +preg    Allergies: No Known Allergies  Meds:  Medications Prior to Admission  Medication Sig Dispense Refill Last Dose/Taking   ondansetron (ZOFRAN-ODT) 4 MG disintegrating tablet Take 1 tablet (4 mg total) by mouth every 8 (eight) hours as needed for nausea or vomiting. 30 tablet 0 11/15/2023   aspirin EC 81 MG tablet Take 1 tablet (81 mg total) by mouth daily. (Patient not taking: Reported on 09/14/2023) 90 tablet 3    cyclobenzaprine (FLEXERIL) 10 MG tablet Take 1 tablet (10 mg total) by mouth 3 (three) times daily as needed for muscle spasms. (Patient not taking: Reported on 11/15/2023) 20 tablet 0 Not Taking   diphenhydrAMINE (BENADRYL) 25 mg capsule Take 1  capsule (25 mg total) by mouth every 6 (six) hours as needed (headache). (Patient not taking: Reported on 09/14/2023) 30 capsule 2    famotidine (PEPCID) 20 MG tablet Take 1 tablet (20 mg total) by mouth 2 (two) times daily. (Patient not taking: Reported on 11/15/2023) 30 tablet 0 Not Taking   ferrous sulfate 325 (65 FE) MG tablet Take 1 tablet (325 mg total) by mouth daily. 30 tablet 0    glycopyrrolate (ROBINUL) 2 MG tablet Take 1 tablet (2 mg total) by mouth 3 (three) times daily as needed (spitting, nausea). 30 tablet 2    Iron, Ferrous Sulfate, 325 (65 Fe) MG TABS Take 1 tablet by mouth daily. 30 tablet 3    metoCLOPramide (REGLAN) 10 MG tablet Take 1 tablet (10 mg total) by mouth every 8 (eight) hours as needed for nausea (headache). (Patient not taking: Reported on 11/15/2023) 30 tablet 2 Not Taking   pantoprazole (PROTONIX) 40 MG tablet Take 1 tablet (40 mg total) by mouth daily. (Patient not taking: Reported on 11/15/2023) 30 tablet 3 Not Taking   promethazine (PHENERGAN) 25 MG tablet Take 1 tablet (25 mg total) by mouth every 6 (six) hours as needed for nausea or vomiting. (Patient not taking: Reported on 11/15/2023) 30 tablet 0 Not Taking   scopolamine (TRANSDERM-SCOP) 1 MG/3DAYS Place 1 patch (1.5 mg total) onto the skin every 3 (three) days. (Patient not taking: Reported on 11/15/2023) 10 patch 2 Not Taking    I have reviewed patient's Past Medical Hx, Surgical Hx, Family Hx, Social Hx, medications and allergies.   ROS:  Review of Systems  Constitutional:  Negative for fever.  Gastrointestinal:  Positive for abdominal pain and vomiting. Negative for diarrhea.  Genitourinary:  Negative for dysuria and frequency.  Musculoskeletal:  Negative for myalgias.   Other systems negative  Physical Exam  Patient Vitals for the past 24 hrs:  BP Temp Pulse Resp SpO2 Height Weight  11/15/23 2018 126/76 -- -- -- -- -- --  11/15/23 2015 -- 98.8 F (37.1 C) 93 18 100 % 5\' 9"  (1.753 m) 75.3 kg    Constitutional: Well-developed, well-nourished female in no acute distress.  Cardiovascular: normal rate and rhythm Respiratory: normal effort GI: Abd soft, tender over left round ligament, gravid appropriate for gestational age.   No rebound or guarding. MS: Extremities nontender, no edema, normal ROM Neurologic: Alert and oriented x 4.  GU: Neg CVAT.  PELVIC EXAM:   Dilation: Fingertip Effacement (%): 30 Station: Ballotable Exam by:: Wynelle Bourgeois CNM   FHT:  Baseline 140 , moderate variability, accelerations present, no decelerations Contractions: q 2 mins    Labs: Results for orders placed or performed during the hospital encounter  of 11/15/23 (from the past 24 hours)  Urinalysis, Routine w reflex microscopic -Urine, Clean Catch     Status: Abnormal   Collection Time: 11/15/23  8:12 PM  Result Value Ref Range   Color, Urine YELLOW YELLOW   APPearance HAZY (A) CLEAR   Specific Gravity, Urine 1.027 1.005 - 1.030   pH 6.0 5.0 - 8.0   Glucose, UA NEGATIVE NEGATIVE mg/dL   Hgb urine dipstick NEGATIVE NEGATIVE   Bilirubin Urine NEGATIVE NEGATIVE   Ketones, ur 80 (A) NEGATIVE mg/dL   Protein, ur 30 (A) NEGATIVE mg/dL   Nitrite NEGATIVE NEGATIVE   Leukocytes,Ua MODERATE (A) NEGATIVE   RBC / HPF 0-5 0 - 5 RBC/hpf   WBC, UA 0-5 0 - 5 WBC/hpf   Bacteria, UA RARE (A) NONE SEEN   Squamous Epithelial / HPF 11-20 0 - 5 /HPF   Mucus PRESENT   Comprehensive metabolic panel     Status: Abnormal   Collection Time: 11/15/23  8:57 PM  Result Value Ref Range   Sodium 135 135 - 145 mmol/L   Potassium 3.3 (L) 3.5 - 5.1 mmol/L   Chloride 103 98 - 111 mmol/L   CO2 21 (L) 22 - 32 mmol/L   Glucose, Bld 74 70 - 99 mg/dL   BUN 7 6 - 20 mg/dL   Creatinine, Ser 4.09 0.44 - 1.00 mg/dL   Calcium 8.6 (L) 8.9 - 10.3 mg/dL   Total Protein 6.5 6.5 - 8.1 g/dL   Albumin 2.6 (L) 3.5 - 5.0 g/dL   AST 19 15 - 41 U/L   ALT 11 0 - 44 U/L   Alkaline Phosphatase 79 38 - 126 U/L   Total Bilirubin 0.7  0.0 - 1.2 mg/dL   GFR, Estimated >81 >19 mL/min   Anion gap 11 5 - 15  Lipase, blood     Status: None   Collection Time: 11/15/23  8:57 PM  Result Value Ref Range   Lipase 24 11 - 51 U/L  Fetal fibronectin     Status: None   Collection Time: 11/15/23  9:34 PM  Result Value Ref Range   Fetal Fibronectin NEGATIVE NEGATIVE    A/Positive/-- (11/25 1119)  Imaging:    MAU Course/MDM: I have reviewed the triage vital signs and the nursing notes.   Pertinent labs & imaging results that were available during my care of the patient were reviewed by me and considered in my medical decision making (see chart for details).      I have reviewed her medical records including past results, notes and treatments.   I have ordered labs and reviewed results. FFn is negative NST reviewed Consult Dr Berton Lan with presentation, exam findings and test results.  Treatments in MAU included Procardia x 2 without cessation of UCs.  Terbutaline which initially slowed UCs and made them nonpainful, but then UCs returned for a while then diminished again..   We also gave IV fluids, zofran for nausea and Percocet for pain.  Assessment: Single IUP at [redacted]w[redacted]d Threatened preterm labor Hyperemesis Dehydration Round ligament pain  Plan: Discharge home Preterm Labor precautions and fetal kick counts Continue antiemetics at home Follow up in Office for prenatal visits and recheck Encouraged to return if she develops worsening of symptoms, increase in pain, fever, or other concerning symptoms.   Pt stable at time of discharge.  Wynelle Bourgeois CNM, MSN Certified Nurse-Midwife 11/15/2023 8:49 PM

## 2023-11-15 NOTE — MAU Note (Addendum)
.  Sharon Barnett is a 33 y.o. at [redacted]w[redacted]d here in MAU reporting pain in her private area. Unable to keep down anything since Tues which is not new for her. Some abdominal pressure but no pain. H/A.  Reports good FM and denies VB. Reports having green vag d/c today. Tried Zofran this am but not helping with n/v. States it hurts a lot between her legs when she walks  LMP: n/a Onset of complaint: Tuesday Pain score: 8 Vitals:   11/15/23 2015 11/15/23 2018  BP:  126/76  Pulse: 93   Resp: 18   Temp: 98.8 F (37.1 C)   SpO2: 100%      FHT: 140  Lab orders placed from triage: u/a

## 2023-11-16 ENCOUNTER — Inpatient Hospital Stay (HOSPITAL_COMMUNITY)
Admission: AD | Admit: 2023-11-16 | Discharge: 2023-11-16 | Disposition: A | Discharge status: Home / Self Care | Attending: Obstetrics and Gynecology | Admitting: Obstetrics and Gynecology

## 2023-11-16 ENCOUNTER — Encounter (HOSPITAL_COMMUNITY): Payer: Self-pay | Admitting: Obstetrics and Gynecology

## 2023-11-16 ENCOUNTER — Other Ambulatory Visit: Payer: Self-pay

## 2023-11-16 DIAGNOSIS — O210 Mild hyperemesis gravidarum: Secondary | ICD-10-CM | POA: Insufficient documentation

## 2023-11-16 DIAGNOSIS — O26899 Other specified pregnancy related conditions, unspecified trimester: Secondary | ICD-10-CM | POA: Diagnosis not present

## 2023-11-16 DIAGNOSIS — Z3490 Encounter for supervision of normal pregnancy, unspecified, unspecified trimester: Secondary | ICD-10-CM

## 2023-11-16 DIAGNOSIS — O479 False labor, unspecified: Secondary | ICD-10-CM | POA: Diagnosis not present

## 2023-11-16 DIAGNOSIS — I959 Hypotension, unspecified: Secondary | ICD-10-CM | POA: Diagnosis not present

## 2023-11-16 DIAGNOSIS — Z3492 Encounter for supervision of normal pregnancy, unspecified, second trimester: Secondary | ICD-10-CM

## 2023-11-16 DIAGNOSIS — O09899 Supervision of other high risk pregnancies, unspecified trimester: Secondary | ICD-10-CM

## 2023-11-16 DIAGNOSIS — Z3A28 28 weeks gestation of pregnancy: Secondary | ICD-10-CM

## 2023-11-16 LAB — WET PREP, GENITAL
Clue Cells Wet Prep HPF POC: NONE SEEN
Sperm: NONE SEEN
Trich, Wet Prep: NONE SEEN
WBC, Wet Prep HPF POC: >=10 — AB (ref <10)
Yeast Wet Prep HPF POC: NONE SEEN

## 2023-11-16 MED ORDER — SCOPOLAMINE 1 MG/3DAYS TD PT72
1.0000 | MEDICATED_PATCH | Freq: Once | TRANSDERMAL | Status: DC
  Administered 2023-11-16: 1.5 mg via TRANSDERMAL
  Filled 2023-11-16: qty 1

## 2023-11-16 MED ORDER — TERBUTALINE SULFATE 1 MG/ML IJ SOLN
0.2500 mg | Freq: Once | INTRAMUSCULAR | Status: AC
  Administered 2023-11-16: 0.25 mg via SUBCUTANEOUS
  Filled 2023-11-16: qty 1

## 2023-11-16 MED ORDER — GLYCOPYRROLATE 0.2 MG/ML IJ SOLN
0.2000 mg | Freq: Once | INTRAMUSCULAR | Status: AC
  Administered 2023-11-16: 0.2 mg via INTRAVENOUS
  Filled 2023-11-16: qty 1

## 2023-11-16 MED ORDER — SODIUM CHLORIDE 0.9 % IV SOLN
8.0000 mg | Freq: Once | INTRAVENOUS | Status: AC
  Administered 2023-11-16: 8 mg via INTRAVENOUS
  Filled 2023-11-16: qty 4

## 2023-11-16 MED ORDER — LACTATED RINGERS IV BOLUS
1000.0000 mL | Freq: Once | INTRAVENOUS | Status: AC
  Administered 2023-11-16: 1000 mL via INTRAVENOUS

## 2023-11-16 MED ORDER — FAMOTIDINE IN NACL 20-0.9 MG/50ML-% IV SOLN
20.0000 mg | Freq: Once | INTRAVENOUS | Status: AC
  Administered 2023-11-16: 20 mg via INTRAVENOUS
  Filled 2023-11-16: qty 50

## 2023-11-16 NOTE — MAU Note (Signed)
 OK to d/c transducer of EFM per Wynelle Bourgeois CNM  and leave toco on to cont monitoring uterine activity

## 2023-11-16 NOTE — MAU Note (Signed)
..  Sharon Barnett is a 33 y.o. at [redacted]w[redacted]d here in MAU reporting: contraction which got more intense today around 1500. Has been contracting since yesterday and was seen yesterday in MAU and given procardia. Pt states she isn't sure if she was sent home with a prescription for procardia to take at home.   Reports greenish, yellowish discharge. Endorses +FM Denies leaking of fluid or vaginal bleeding.   Pain score: 9/10 Vitals:   11/16/23 1709  BP: 116/70  Pulse: 75  Resp: 16  Temp: 98.7 F (37.1 C)  SpO2: 98%     FHT:135  Lab orders placed from triage:  UA

## 2023-11-16 NOTE — Progress Notes (Signed)
 Written and verbal d/c instructions given and understanding voiced.

## 2023-11-16 NOTE — MAU Provider Note (Signed)
 History     CSN: [id number]  Arrival date and time: 11/16/23 1655   Event Date/Time   First Provider Initiated Contact with Patient 11/16/23 1834      Chief Complaint  Patient presents with   Contractions   HPI [name] is a 33 y.o. year old G4P1011 female at [redacted]w[redacted]d weeks gestation who presents to MAU via EMS reporting contractions which got more intense around 1500; rated 1500. She reports contractions since yesterday. She was seen in MAU yesterday and was given Procardia x 2 and a shot of Terbutaline. She reports she doesn't like how it made her feel. She receives [facility] with [facility].     OB History     Gravida  3   Para  1   Term  1   Preterm  0   AB  1   Living  1      SAB  0   IAB  1   Ectopic  0   Multiple  0   Live Births  1           Past Medical History:  Diagnosis Date   ADHD (attention deficit hyperactivity disorder)    Complication of anesthesia    Hypersensitive to anesthesia (wisdom teeth)   Fibroid    Headache    Hyperemesis gravidarum    UTI (urinary tract infection)     Past Surgical History:  Procedure Laterality Date   OTHER SURGICAL HISTORY     elective abortion   WISDOM TOOTH EXTRACTION      Family History  Problem Relation Age of Onset   Depression Mother    Diabetes Mother    Hypertension Mother    Fibroids Mother    Bipolar disorder Mother    Sarcoidosis Father    Asthma Sister    Depression Sister    Hearing loss Sister    Hyperlipidemia Sister    Hypertension Sister    Miscarriages / Stillbirths Sister    Hearing loss Maternal Grandmother    Diabetes Maternal Grandmother    Hypertension Maternal Grandmother    Stroke Maternal Grandmother    Brain cancer Maternal Grandmother    Hodgkin's lymphoma Niece     Social History   Tobacco Use   Smoking status: Never   Smokeless tobacco: Never  Vaping Use   Vaping status: Never Used  Substance Use Topics   Alcohol use: No   Drug use: Not Currently     Types: Marijuana    Comment: not since +preg    Allergies: No Known Allergies  Medications Prior to Admission  Medication Sig Dispense Refill Last Dose/Taking   Iron, Ferrous Sulfate, 325 (65 Fe) MG TABS Take 1 tablet by mouth daily. 30 tablet 3 Past Month   ondansetron (ZOFRAN-ODT) 4 MG disintegrating tablet Take 1 tablet (4 mg total) by mouth every 8 (eight) hours as needed for nausea or vomiting. 30 tablet 0 Past Month   aspirin EC 81 MG tablet Take 1 tablet (81 mg total) by mouth daily. (Patient not taking: Reported on 09/14/2023) 90 tablet 3    cyclobenzaprine (FLEXERIL) 10 MG tablet Take 1 tablet (10 mg total) by mouth 3 (three) times daily as needed for muscle spasms. (Patient not taking: Reported on 11/15/2023) 20 tablet 0    diphenhydrAMINE (BENADRYL) 25 mg capsule Take 1 capsule (25 mg total) by mouth every 6 (six) hours as needed (headache). (Patient not taking: Reported on 09/14/2023) 30 capsule 2  famotidine (PEPCID) 20 MG tablet Take 1 tablet (20 mg total) by mouth 2 (two) times daily. (Patient not taking: Reported on 11/15/2023) 30 tablet 0    ferrous sulfate 325 (65 FE) MG tablet Take 1 tablet (325 mg total) by mouth daily. 30 tablet 0    glycopyrrolate (ROBINUL) 2 MG tablet Take 1 tablet (2 mg total) by mouth 3 (three) times daily as needed (spitting, nausea). 30 tablet 2    metoCLOPramide (REGLAN) 10 MG tablet Take 1 tablet (10 mg total) by mouth every 8 (eight) hours as needed for nausea (headache). (Patient not taking: Reported on 11/15/2023) 30 tablet 2    pantoprazole (PROTONIX) 40 MG tablet Take 1 tablet (40 mg total) by mouth daily. (Patient not taking: Reported on 11/15/2023) 30 tablet 3    promethazine (PHENERGAN) 25 MG tablet Take 1 tablet (25 mg total) by mouth every 6 (six) hours as needed for nausea or vomiting. (Patient not taking: Reported on 11/15/2023) 30 tablet 0    scopolamine (TRANSDERM-SCOP) 1 MG/3DAYS Place 1 patch (1.5 mg total) onto the skin every 3 (three)  days. (Patient not taking: Reported on 11/15/2023) 10 patch 2     Review of Systems  Constitutional:  Positive for appetite change.  HENT: Negative.    Eyes: Negative.   Respiratory: Negative.    Cardiovascular: Negative.   Gastrointestinal:  Positive for nausea and vomiting.  Endocrine: Negative.   Genitourinary:  Positive for pelvic pain and vaginal discharge (green vaginal d/c).  Musculoskeletal: Negative.   Skin: Negative.   Allergic/Immunologic: Negative.   Neurological: Negative.   Hematological: Negative.   Psychiatric/Behavioral: Negative.     Physical Exam   Blood pressure 116/70, pulse 75, temperature 98.7 F (37.1 C), temperature source Oral, resp. rate 16, height 5\' 9"  (1.753 m), weight 75.5 kg, last menstrual period 04/29/2023, SpO2 98%, unknown if currently breastfeeding.  Physical Exam Vitals and nursing note reviewed. Exam conducted with a chaperone present.  Constitutional:      Appearance: Normal appearance. She is normal weight.  Cardiovascular:     Rate and Rhythm: Normal rate.  Pulmonary:     Effort: Pulmonary effort is normal.  Abdominal:     Palpations: Abdomen is soft.  Genitourinary:    General: Normal vulva.     Vagina: Vaginal discharge present.     Comments: Pelvic exam: External genitalia normal, SE: vaginal walls pink and well rugated, cervix is smooth, pink, no lesions, moderate amt of thick, yellowish-white vaginal d/c -- WP, GC/CT done, cervix visually closed, Uterus is non-tender, S=D, no CMT or friability, no adnexal tenderness.  Musculoskeletal:        General: Normal range of motion.  Skin:    General: Skin is warm and dry.  Neurological:     Mental Status: She is alert and oriented to person, place, and time.  Psychiatric:        Mood and Affect: Mood normal.        Behavior: Behavior normal.        Thought Content: Thought content normal.        Judgment: Judgment normal.    REACTIVE NST - FHR: 130 bpm / moderate variability /  accels present / decels absent / TOCO: regular every 1-3 mins  Reassessment @ 2030: Patient sleeping. Zofran just arrived from pharmacy. MAU Course  Procedures  MDM Wet Prep GC/CT -- Results pending  IVFs: LR 1000 ml @ 999 ml/hr Zofran 8 mg IVPB  -- no nausea/vomiting  Scopolamine Patch x 1 Pepcid 20 mg IVPB Robinul 0.2 mg IVP Patient tolerated crackers and po fluids  Results for orders placed or performed during the hospital encounter of 11/16/23 (from the past 24 hours)  Wet prep, genital     Status: Abnormal   Collection Time: 11/16/23  6:38 PM  Result Value Ref Range   Yeast Wet Prep HPF POC NONE SEEN NONE SEEN   Trich, Wet Prep NONE SEEN NONE SEEN   Clue Cells Wet Prep HPF POC NONE SEEN NONE SEEN   WBC, Wet Prep HPF POC >=10 (A) <10   Sperm NONE SEEN     Assessment and Plan  1. Morning sickness - Advised to pick-up medications from pharmacy that she was not able to get earlier today and take them as previously prescribed.  2. [redacted] weeks gestation of pregnancy   - Discharge home - Keep scheduled appt with [facility]  - Patient verbalized an understanding of the plan of care and agrees.   Raelyn Mora, CNM 11/16/2023, 6:34 PM

## 2023-11-16 NOTE — Discharge Instructions (Signed)
 Go pick up your prescriptions at the pharmacy in the morning and take them as directed.

## 2023-11-19 ENCOUNTER — Encounter: Payer: Self-pay | Admitting: Family Medicine

## 2023-11-20 ENCOUNTER — Other Ambulatory Visit: Payer: Self-pay

## 2023-11-20 DIAGNOSIS — Z3A28 28 weeks gestation of pregnancy: Secondary | ICD-10-CM

## 2023-11-20 DIAGNOSIS — O99013 Anemia complicating pregnancy, third trimester: Secondary | ICD-10-CM

## 2023-11-20 LAB — GC/CHLAMYDIA PROBE AMP (~~LOC~~) NOT AT ARMC
Chlamydia: NEGATIVE
Comment: NEGATIVE
Comment: NORMAL
Neisseria Gonorrhea: NEGATIVE

## 2023-11-23 ENCOUNTER — Other Ambulatory Visit

## 2023-11-23 DIAGNOSIS — Z3493 Encounter for supervision of normal pregnancy, unspecified, third trimester: Secondary | ICD-10-CM | POA: Diagnosis not present

## 2023-11-23 DIAGNOSIS — O99013 Anemia complicating pregnancy, third trimester: Secondary | ICD-10-CM | POA: Diagnosis not present

## 2023-11-23 DIAGNOSIS — Z3A28 28 weeks gestation of pregnancy: Secondary | ICD-10-CM

## 2023-11-24 LAB — GLUCOSE TOLERANCE, 2 HOURS W/ 1HR
Glucose, 1 hour: 129 mg/dL (ref 70–179)
Glucose, 2 hour: 85 mg/dL (ref 70–152)
Glucose, Fasting: 91 mg/dL (ref 70–91)

## 2023-11-24 LAB — CBC
Hematocrit: 25 % — ABNORMAL LOW (ref 34.0–46.6)
Hemoglobin: 7.8 g/dL — ABNORMAL LOW (ref 11.1–15.9)
MCH: 25.6 pg — ABNORMAL LOW (ref 26.6–33.0)
MCHC: 31.2 g/dL — ABNORMAL LOW (ref 31.5–35.7)
MCV: 82 fL (ref 79–97)
Platelets: 310 10*3/uL (ref 150–450)
RBC: 3.05 x10E6/uL — ABNORMAL LOW (ref 3.77–5.28)
RDW: 13.1 % (ref 11.7–15.4)
WBC: 7.3 10*3/uL (ref 3.4–10.8)

## 2023-11-24 LAB — HIV ANTIBODY (ROUTINE TESTING W REFLEX): HIV Screen 4th Generation wRfx: NONREACTIVE

## 2023-11-24 LAB — RPR: RPR Ser Ql: NONREACTIVE

## 2023-11-29 ENCOUNTER — Inpatient Hospital Stay (HOSPITAL_COMMUNITY)
Admission: AD | Admit: 2023-11-29 | Discharge: 2023-11-29 | Disposition: A | Discharge status: Home / Self Care | Payer: Self-pay | Attending: Obstetrics and Gynecology | Admitting: Obstetrics and Gynecology

## 2023-11-29 ENCOUNTER — Encounter (HOSPITAL_COMMUNITY): Payer: Self-pay | Admitting: Obstetrics and Gynecology

## 2023-11-29 ENCOUNTER — Other Ambulatory Visit: Payer: Self-pay

## 2023-11-29 DIAGNOSIS — Z3A30 30 weeks gestation of pregnancy: Secondary | ICD-10-CM | POA: Diagnosis not present

## 2023-11-29 DIAGNOSIS — R519 Headache, unspecified: Secondary | ICD-10-CM | POA: Diagnosis not present

## 2023-11-29 DIAGNOSIS — O479 False labor, unspecified: Secondary | ICD-10-CM | POA: Diagnosis not present

## 2023-11-29 DIAGNOSIS — O212 Late vomiting of pregnancy: Secondary | ICD-10-CM | POA: Diagnosis present

## 2023-11-29 DIAGNOSIS — O99891 Other specified diseases and conditions complicating pregnancy: Secondary | ICD-10-CM | POA: Diagnosis not present

## 2023-11-29 MED ORDER — FAMOTIDINE IN NACL 20-0.9 MG/50ML-% IV SOLN
20.0000 mg | Freq: Once | INTRAVENOUS | Status: AC
  Administered 2023-11-29: 20 mg via INTRAVENOUS
  Filled 2023-11-29: qty 50

## 2023-11-29 MED ORDER — LACTATED RINGERS IV BOLUS
1000.0000 mL | Freq: Once | INTRAVENOUS | Status: AC
  Administered 2023-11-29: 1000 mL via INTRAVENOUS

## 2023-11-29 MED ORDER — TERBUTALINE SULFATE 1 MG/ML IJ SOLN
0.2500 mg | Freq: Once | INTRAMUSCULAR | Status: AC
  Administered 2023-11-29: 0.25 mg via SUBCUTANEOUS
  Filled 2023-11-29: qty 1

## 2023-11-29 MED ORDER — SCOPOLAMINE 1 MG/3DAYS TD PT72
1.0000 | MEDICATED_PATCH | TRANSDERMAL | Status: DC
  Administered 2023-11-29: 1.5 mg via TRANSDERMAL
  Filled 2023-11-29: qty 1

## 2023-11-29 MED ORDER — ONDANSETRON HCL 4 MG/2ML IJ SOLN
4.0000 mg | Freq: Once | INTRAMUSCULAR | Status: AC
  Administered 2023-11-29: 4 mg via INTRAVENOUS
  Filled 2023-11-29: qty 2

## 2023-11-29 MED ORDER — NIFEDIPINE 10 MG PO CAPS: 10.0000 mg | ORAL_CAPSULE | Freq: Three times a day (TID) | ORAL | 0 refills | Status: DC | PRN

## 2023-11-29 MED ORDER — NIFEDIPINE 10 MG PO CAPS
10.0000 mg | ORAL_CAPSULE | ORAL | Status: AC
  Administered 2023-11-29 (×3): 10 mg via ORAL
  Filled 2023-11-29 (×3): qty 1

## 2023-11-29 MED ORDER — LACTATED RINGERS IV BOLUS
1000.0000 mL | Freq: Once | INTRAVENOUS | Status: AC
Start: 2023-11-29 — End: 2023-11-29
  Administered 2023-11-29: 1000 mL via INTRAVENOUS

## 2023-11-29 MED ORDER — ACETAMINOPHEN-CAFFEINE 500-65 MG PO TABS
2.0000 | ORAL_TABLET | Freq: Once | ORAL | Status: AC
  Administered 2023-11-29: 2 via ORAL
  Filled 2023-11-29: qty 2

## 2023-11-29 NOTE — MAU Provider Note (Signed)
 History     CSN: [id number]  Arrival date and time: 11/29/23 1302   None     Chief Complaint  Patient presents with   Contractions   Emesis   HPI ANAILY ASHBAUGH is a 33 yo G3P1 @ [redacted]w[redacted]d presenting for preterm contractions which started last night but got worse this morning.  She has been seen in the MAU multiple times for preterm contractions and given Procardia as well as terbutaline.  She reports that she did not like how the terbutaline made her feel but was okay with the Procardia.  Reports that she is also been having trouble keeping down fluids and has been vomiting which she feels like is making it worse.  OB History     Gravida  3   Para  1   Term  1   Preterm  0   AB  1   Living  1      SAB  0   IAB  1   Ectopic  0   Multiple  0   Live Births  1           Past Medical History:  Diagnosis Date   ADHD (attention deficit hyperactivity disorder)    Complication of anesthesia    Hypersensitive to anesthesia (wisdom teeth)   Fibroid    Headache    Hyperemesis gravidarum    UTI (urinary tract infection)     Past Surgical History:  Procedure Laterality Date   OTHER SURGICAL HISTORY     elective abortion   WISDOM TOOTH EXTRACTION      Family History  Problem Relation Age of Onset   Depression Mother    Diabetes Mother    Hypertension Mother    Fibroids Mother    Bipolar disorder Mother    Sarcoidosis Father    Asthma Sister    Depression Sister    Hearing loss Sister    Hyperlipidemia Sister    Hypertension Sister    Miscarriages / Stillbirths Sister    Hearing loss Maternal Grandmother    Diabetes Maternal Grandmother    Hypertension Maternal Grandmother    Stroke Maternal Grandmother    Brain cancer Maternal Grandmother    Hodgkin's lymphoma Niece     Social History   Tobacco Use   Smoking status: Never   Smokeless tobacco: Never  Vaping Use   Vaping status: Never Used  Substance Use Topics   Alcohol use: No   Drug  use: Not Currently    Types: Marijuana    Comment: not since +preg    Allergies: No Known Allergies  Medications Prior to Admission  Medication Sig Dispense Refill Last Dose/Taking   aspirin EC 81 MG tablet Take 1 tablet (81 mg total) by mouth daily. 90 tablet 3 Past Week   famotidine (PEPCID) 20 MG tablet Take 1 tablet (20 mg total) by mouth 2 (two) times daily. 30 tablet 0 11/29/2023 at  7:00 AM   glycopyrrolate (ROBINUL) 2 MG tablet Take 1 tablet (2 mg total) by mouth 3 (three) times daily as needed (spitting, nausea). 30 tablet 2 11/29/2023 at  7:00 AM   metoCLOPramide (REGLAN) 10 MG tablet Take 1 tablet (10 mg total) by mouth every 8 (eight) hours as needed for nausea (headache). 30 tablet 2 11/29/2023 at  7:00 AM   pantoprazole (PROTONIX) 40 MG tablet Take 1 tablet (40 mg total) by mouth daily. 30 tablet 3 11/29/2023 at  7:00 AM   cyclobenzaprine (FLEXERIL) 10  MG tablet Take 1 tablet (10 mg total) by mouth 3 (three) times daily as needed for muscle spasms. (Patient not taking: Reported on 11/15/2023) 20 tablet 0    diphenhydrAMINE (BENADRYL) 25 mg capsule Take 1 capsule (25 mg total) by mouth every 6 (six) hours as needed (headache). (Patient not taking: Reported on 09/14/2023) 30 capsule 2    ferrous sulfate 325 (65 FE) MG tablet Take 1 tablet (325 mg total) by mouth daily. 30 tablet 0    Iron, Ferrous Sulfate, 325 (65 Fe) MG TABS Take 1 tablet by mouth daily. 30 tablet 3    ondansetron (ZOFRAN-ODT) 4 MG disintegrating tablet Take 1 tablet (4 mg total) by mouth every 8 (eight) hours as needed for nausea or vomiting. 30 tablet 0    promethazine (PHENERGAN) 25 MG tablet Take 1 tablet (25 mg total) by mouth every 6 (six) hours as needed for nausea or vomiting. (Patient not taking: Reported on 11/15/2023) 30 tablet 0    scopolamine (TRANSDERM-SCOP) 1 MG/3DAYS Place 1 patch (1.5 mg total) onto the skin every 3 (three) days. (Patient not taking: Reported on 11/15/2023) 10 patch 2     Review of  Systems  Gastrointestinal:  Positive for abdominal pain, nausea and vomiting.  Genitourinary:  Negative for vaginal bleeding and vaginal discharge.  All other systems reviewed and are negative.  Physical Exam   Blood pressure 118/69, pulse 86, temperature 98.6 F (37 C), temperature source Oral, resp. rate 18, last menstrual period 04/29/2023, SpO2 100%, unknown if currently breastfeeding.  Physical Exam Vitals reviewed.  Constitutional:      Appearance: Normal appearance.  HENT:     Head: Normocephalic and atraumatic.     Mouth/Throat:     Mouth: Mucous membranes are moist.  Cardiovascular:     Rate and Rhythm: Normal rate.     Pulses: Normal pulses.  Abdominal:     Palpations: Abdomen is soft.     Tenderness: There is no abdominal tenderness.     Comments: Gravid  Genitourinary:    Comments: SVE with chaperone present, fingertip/thick/-3 Musculoskeletal:     Cervical back: Normal range of motion.  Skin:    General: Skin is warm.     Capillary Refill: Capillary refill takes less than 2 seconds.  Neurological:     Mental Status: She is alert.     MAU Course  Procedures  MDM LR fluid bolus Procardia Terbutaline NST Zofran Excedrin tension Pepcid  Assessment and Plan  Miesha Bachmann Igoe is a 33 yo G3P1 @ [redacted]w[redacted]d presenting for preterm contractions.  Preterm labor - LR fluid bolus given with little response and continued contractions every 2 to 3 minutes - Procardia 10 mg q. 20 minutes given for 3 doses and contractions did slow down but still happening consistently - 1 dose of terbutaline given in contractions resolved -Continue monitoring and contractions did not return - Patient had a headache likely from the Procardia and was given Excedrin tension.  Headache resolved - Nausea occurred with a headache so given Zofran and Pepcid and nausea responded well - Discussed Procardia prescription and encouraged hydration.  Prescription sent to patient's pharmacy for 10  mg immediate release Procardia every 6 hours as needed for contractions -Discussed strict return precautions and patient discharged home    Celedonio Savage 11/29/2023, 4:42 PM

## 2023-11-29 NOTE — MAU Note (Addendum)
 Sharon Barnett is a 33 y.o. at [redacted]w[redacted]d here in MAU reporting: EMS arrival, she reports she's been contracting for the past few days, but it's increased today. When asked how far apart they are, she says she's unsure. She reports she feels vaginal pressure. She's been vomiting today, she reports she's unsure of how many occurrences of vomiting she's had but she knows it's more than 15. Denies diarrhea. Endorses +FM, Denies VB and LOF.   Pain score:   ctx abdomen lower 10/10  Lower back 8/10 Headache 10/10  Vitals:   11/29/23 1307 11/29/23 1324  BP: 118/62 112/67  Pulse: 88 75  Resp: 18   Temp: 98.6 F (37 C)      FHT: 134 initial external  Lab orders placed from triage: none

## 2023-11-29 NOTE — Discharge Instructions (Signed)
 It was great taking care of you today.  We were able to place a scopolamine patch today and that should stay on for 3 days.  You can replace it every 3 days and then that you have a prescription for this at home.  Regarding your contractions they should not return if you keep drinking plenty of fluids.  If they do return I sent a prescription for Procardia 10 mg which she can take 3 times daily as needed for contractions.  If they worsen or do not respond to the medicine please return for further evaluation.  If you notice any leaking of fluid, vaginal bleeding please also return.  I hope you have a great night!

## 2023-12-02 LAB — ANEMIA PROFILE B
Ferritin: 14 ng/mL — ABNORMAL LOW (ref 15–150)
Folate: 3.7 ng/mL (ref >3.0)
Hematocrit: 25 % — ABNORMAL LOW (ref 34.0–46.6)
Hemoglobin: 7.8 g/dL — ABNORMAL LOW (ref 11.1–15.9)
Iron Saturation: 5 % — CL (ref 15–55)
Iron: 27 ug/dL (ref 27–159)
MCH: 25.6 pg — ABNORMAL LOW (ref 26.6–33.0)
MCHC: 31.2 g/dL — ABNORMAL LOW (ref 31.5–35.7)
MCV: 82 fL (ref 79–97)
Platelets: 310 10*3/uL (ref 150–450)
RBC: 3.05 x10E6/uL — ABNORMAL LOW (ref 3.77–5.28)
RDW: 13.1 % (ref 11.7–15.4)
Total Iron Binding Capacity: 554 ug/dL (ref 250–450)
UIBC: 527 ug/dL — ABNORMAL HIGH (ref 131–425)
Vitamin B-12: 173 pg/mL — ABNORMAL LOW (ref 232–1245)
WBC: 7.3 10*3/uL (ref 3.4–10.8)

## 2023-12-02 LAB — SPECIMEN STATUS REPORT

## 2023-12-03 ENCOUNTER — Encounter: Payer: Self-pay | Admitting: Obstetrics and Gynecology

## 2023-12-03 DIAGNOSIS — O99019 Anemia complicating pregnancy, unspecified trimester: Secondary | ICD-10-CM | POA: Insufficient documentation

## 2023-12-04 ENCOUNTER — Encounter (HOSPITAL_COMMUNITY): Payer: Self-pay | Admitting: Obstetrics and Gynecology

## 2023-12-04 ENCOUNTER — Other Ambulatory Visit (HOSPITAL_COMMUNITY): Payer: Self-pay

## 2023-12-04 ENCOUNTER — Inpatient Hospital Stay (HOSPITAL_COMMUNITY)
Admission: AD | Admit: 2023-12-04 | Discharge: 2023-12-04 | Disposition: A | Discharge status: Home / Self Care | Payer: Self-pay | Attending: Obstetrics and Gynecology | Admitting: Obstetrics and Gynecology

## 2023-12-04 ENCOUNTER — Other Ambulatory Visit: Payer: Self-pay

## 2023-12-04 DIAGNOSIS — O479 False labor, unspecified: Secondary | ICD-10-CM | POA: Diagnosis not present

## 2023-12-04 DIAGNOSIS — O4700 False labor before 37 completed weeks of gestation, unspecified trimester: Secondary | ICD-10-CM | POA: Diagnosis not present

## 2023-12-04 DIAGNOSIS — O42919 Preterm premature rupture of membranes, unspecified as to length of time between rupture and onset of labor, unspecified trimester: Secondary | ICD-10-CM | POA: Diagnosis not present

## 2023-12-04 DIAGNOSIS — R112 Nausea with vomiting, unspecified: Secondary | ICD-10-CM | POA: Diagnosis not present

## 2023-12-04 DIAGNOSIS — I959 Hypotension, unspecified: Secondary | ICD-10-CM | POA: Diagnosis not present

## 2023-12-04 DIAGNOSIS — O219 Vomiting of pregnancy, unspecified: Secondary | ICD-10-CM | POA: Insufficient documentation

## 2023-12-04 DIAGNOSIS — Z3A31 31 weeks gestation of pregnancy: Secondary | ICD-10-CM | POA: Diagnosis not present

## 2023-12-04 DIAGNOSIS — O4703 False labor before 37 completed weeks of gestation, third trimester: Secondary | ICD-10-CM | POA: Insufficient documentation

## 2023-12-04 DIAGNOSIS — R1111 Vomiting without nausea: Secondary | ICD-10-CM | POA: Diagnosis not present

## 2023-12-04 DIAGNOSIS — O99283 Endocrine, nutritional and metabolic diseases complicating pregnancy, third trimester: Secondary | ICD-10-CM | POA: Insufficient documentation

## 2023-12-04 DIAGNOSIS — E860 Dehydration: Secondary | ICD-10-CM | POA: Diagnosis not present

## 2023-12-04 LAB — URINALYSIS, ROUTINE W REFLEX MICROSCOPIC
Bilirubin Urine: NEGATIVE
Glucose, UA: NEGATIVE mg/dL
Hgb urine dipstick: NEGATIVE
Ketones, ur: 80 mg/dL — AB
Nitrite: NEGATIVE
Protein, ur: 30 mg/dL — AB
Specific Gravity, Urine: 1.028 (ref 1.005–1.030)
pH: 6 (ref 5.0–8.0)

## 2023-12-04 LAB — WET PREP, GENITAL
Clue Cells Wet Prep HPF POC: NONE SEEN
Sperm: NONE SEEN
Trich, Wet Prep: NONE SEEN
WBC, Wet Prep HPF POC: >=10 — AB (ref <10)
Yeast Wet Prep HPF POC: NONE SEEN

## 2023-12-04 MED ORDER — ONDANSETRON 4 MG PO TBDP
4.0000 mg | ORAL_TABLET | Freq: Once | ORAL | Status: AC
  Administered 2023-12-04: 4 mg via ORAL
  Filled 2023-12-04: qty 1

## 2023-12-04 MED ORDER — SCOPOLAMINE 1 MG/3DAYS TD PT72
1.0000 | MEDICATED_PATCH | Freq: Once | TRANSDERMAL | Status: DC
  Administered 2023-12-04: 1.5 mg via TRANSDERMAL
  Filled 2023-12-04: qty 1

## 2023-12-04 MED ORDER — LACTATED RINGERS IV BOLUS
1000.0000 mL | Freq: Once | INTRAVENOUS | Status: AC
  Administered 2023-12-04: 1000 mL via INTRAVENOUS

## 2023-12-04 MED ORDER — ACETAMINOPHEN-CAFFEINE 500-65 MG PO TABS
2.0000 | ORAL_TABLET | Freq: Once | ORAL | Status: AC
  Administered 2023-12-04: 2 via ORAL
  Filled 2023-12-04: qty 2

## 2023-12-04 MED ORDER — NIFEDIPINE ER 30 MG PO TB24
30.0000 mg | ORAL_TABLET | Freq: Every day | ORAL | 3 refills | Status: DC
  Filled 2023-12-04 – 2023-12-07 (×2): qty 30, 30d supply, fill #0

## 2023-12-04 MED ORDER — SCOPOLAMINE 1 MG/3DAYS TD PT72
1.0000 | MEDICATED_PATCH | TRANSDERMAL | 1 refills | Status: DC
  Filled 2023-12-04 – 2023-12-07 (×2): qty 10, 30d supply, fill #0

## 2023-12-04 MED ORDER — NIFEDIPINE 10 MG PO CAPS
10.0000 mg | ORAL_CAPSULE | ORAL | Status: AC | PRN
  Administered 2023-12-04 (×3): 10 mg via ORAL
  Filled 2023-12-04 (×3): qty 1

## 2023-12-04 MED ORDER — ONDANSETRON 4 MG PO TBDP
4.0000 mg | ORAL_TABLET | Freq: Three times a day (TID) | ORAL | 4 refills | Status: DC | PRN
  Filled 2023-12-04 – 2023-12-08 (×3): qty 15, 5d supply, fill #0

## 2023-12-04 MED ORDER — NIFEDIPINE 10 MG PO CAPS: 10.0000 mg | ORAL_CAPSULE | ORAL | Status: DC | PRN

## 2023-12-04 NOTE — MAU Note (Signed)
 Sharon Barnett is a 33 y.o. at [redacted]w[redacted]d here in MAU reporting: she's been contracting since Sunday. She was here last Thursday for the same complaint, and was prescribed procardia, however, she hasn't been taking it because it wasn't covered by her insurance. She reports she's feeling her baby move, but not as much as normal. Denies VB and LOF. She reports she's been vomiting and  has had about 25 occurrences in the past 24 hours. She's been taking reglan and just took her scop patch off today.    Onset of complaint: Sunday  Pain score:  10/10 lower abdomen and pelvis Vitals:   12/04/23 1343  BP: 121/67  Pulse: 78  Resp: 17  Temp: 98.4 F (36.9 C)     FHT: 132 initial external   Lab orders placed from triage: none

## 2023-12-04 NOTE — MAU Provider Note (Cosign Needed Addendum)
 Chief Complaint:  Contractions   HPI    Sharon Barnett is a 33 y.o. G3P1011 at [redacted]w[redacted]d who presents to maternity admissions reporting contractions and N/V.  Pt was last seen on 3/27 for similar complaints. She now states that her contractions have been going on since Sunday. She rates them a 10/10 and reprts that they are about 2 min apart. She has had no lose of fluids or vaginal bleeding.  She also reports that she is unable to keep anything down but is not vomiting blood. She reports that she is not able to pick up her medications so she has not been taking them.   Pregnancy Course: When asked if she had any complications pt states "I do not listen when they tell me that stuff"  Past Medical History:  Diagnosis Date   ADHD (attention deficit hyperactivity disorder)    Complication of anesthesia    Hypersensitive to anesthesia (wisdom teeth)   Fibroid    Headache    Hyperemesis gravidarum    UTI (urinary tract infection)    OB History  Gravida Para Term Preterm AB Living  3 1 1  0 1 1  SAB IAB Ectopic Multiple Live Births  0 1 0 0 1    # Outcome Date GA Lbr Len/2nd Weight Sex Type Anes PTL Lv  3 Current           2 IAB 07/25/22 [redacted]w[redacted]d         1 Term 07/18/16 [redacted]w[redacted]d  3680 g M Vag-Spont   LIV   Past Surgical History:  Procedure Laterality Date   OTHER SURGICAL HISTORY     elective abortion   WISDOM TOOTH EXTRACTION     Family History  Problem Relation Age of Onset   Depression Mother    Diabetes Mother    Hypertension Mother    Fibroids Mother    Bipolar disorder Mother    Sarcoidosis Father    Asthma Sister    Depression Sister    Hearing loss Sister    Hyperlipidemia Sister    Hypertension Sister    Miscarriages / Stillbirths Sister    Hearing loss Maternal Grandmother    Diabetes Maternal Grandmother    Hypertension Maternal Grandmother    Stroke Maternal Grandmother    Brain cancer Maternal Grandmother    Hodgkin's lymphoma Niece    Social History    Tobacco Use   Smoking status: Never   Smokeless tobacco: Never  Vaping Use   Vaping status: Never Used  Substance Use Topics   Alcohol use: No   Drug use: Not Currently    Types: Marijuana    Comment: not since +preg   No Known Allergies No medications prior to admission.    I have reviewed patient's Past Medical Hx, Surgical Hx, Family Hx, Social Hx, medications and allergies.   ROS  Review of Systems  Constitutional:  Negative for chills and fever.  Cardiovascular:  Positive for leg swelling.  Gastrointestinal:  Positive for abdominal pain, nausea and vomiting.  Genitourinary:  Negative for dysuria and urgency.  Psychiatric/Behavioral:  Negative for hallucinations (.ssig.ssig).      PHYSICAL EXAM  Patient Vitals for the past 24 hrs:  BP Temp Temp src Pulse Resp SpO2  12/04/23 1736 (!) 117/59 -- -- 96 -- 98 %  12/04/23 1730 (!) 103/53 -- -- 96 -- 99 %  12/04/23 1659 112/60 -- -- 92 -- --  12/04/23 1637 110/61 -- -- 99 -- --  12/04/23  1617 110/66 -- -- 85 -- --  12/04/23 1353 123/67 -- -- 84 -- --  12/04/23 1343 121/67 98.4 F (36.9 C) Oral 78 17 --    Constitutional: Well-developed, well-nourished female in no acute distress.  Cardiovascular: normal rate & rhythm, warm and well-perfused Respiratory: normal effort, no problems with respiration noted GI: Abd soft, non-tender, non-distended MS: Extremities nontender, no edema, normal ROM Neurologic: Alert and oriented x 4.  Pelvic: NEFG, physiologic discharge, no blood, cervix clean.   Dilation: 1 Effacement (%): 60 Exam by:: Dorathy Daft, CNM  Fetal Tracing: Baseline: 130 Variability: present Accelerations: present Decelerations: absent Toco: irregular   Labs: Results for orders placed or performed during the hospital encounter of 12/04/23 (from the past 24 hours)  Urinalysis, Routine w reflex microscopic -Urine, Clean Catch     Status: Abnormal   Collection Time: 12/04/23  3:26 PM  Result Value Ref Range    Color, Urine AMBER (A) YELLOW   APPearance HAZY (A) CLEAR   Specific Gravity, Urine 1.028 1.005 - 1.030   pH 6.0 5.0 - 8.0   Glucose, UA NEGATIVE NEGATIVE mg/dL   Hgb urine dipstick NEGATIVE NEGATIVE   Bilirubin Urine NEGATIVE NEGATIVE   Ketones, ur 80 (A) NEGATIVE mg/dL   Protein, ur 30 (A) NEGATIVE mg/dL   Nitrite NEGATIVE NEGATIVE   Leukocytes,Ua SMALL (A) NEGATIVE   RBC / HPF 0-5 0 - 5 RBC/hpf   WBC, UA 0-5 0 - 5 WBC/hpf   Bacteria, UA FEW (A) NONE SEEN   Squamous Epithelial / HPF 6-10 0 - 5 /HPF   Mucus PRESENT   Wet prep, genital     Status: Abnormal   Collection Time: 12/04/23  3:26 PM   Specimen: PATH Cytology Cervicovaginal Ancillary Only  Result Value Ref Range   Yeast Wet Prep HPF POC NONE SEEN NONE SEEN   Trich, Wet Prep NONE SEEN NONE SEEN   Clue Cells Wet Prep HPF POC NONE SEEN NONE SEEN   WBC, Wet Prep HPF POC >=10 (A) <10   Sperm NONE SEEN     Imaging:  No results found.  MDM & MAU COURSE  MDM:  MAU Course:  Pt was given LR and zofran to help with N/V and dehydration   1600 Pt reports that she is not feeling as nauseas now. Her headache is also starting to go away after the medication. She still reports having contractions. Procardia was order to now help with contractions.   1639 Despite the fluids and procardia pt still continues to contract.   1715 Pt feeling much better contractions are under control.  1728  Scopolamine patch was placed to help with N/V after discharge  Orders Placed This Encounter  Procedures   Wet prep, genital   Urinalysis, Routine w reflex microscopic -Urine, Clean Catch   meds to beds pharmacy consult ([facility] ONLY)   Insert peripheral IV   Discharge patient Discharge disposition: 01-Home or Self Care; Discharge patient date: 12/04/2023   Meds ordered this encounter  Medications   lactated ringers bolus 1,000 mL   ondansetron (ZOFRAN-ODT) disintegrating tablet 4 mg   acetaminophen-caffeine (EXCEDRIN TENSION  HEADACHE) 500-65 MG per tablet 2 tablet   NIFEdipine (ADALAT CC) 30 MG 24 hr tablet    Sig: Take 1 tablet (30 mg total) by mouth daily.    Dispense:  30 tablet    Refill:  3    30mg  24h tab ok per Sandra Cockayne secure chat- 12/04/23-cht    Supervising Provider:  PRATT, TANYA S [[id number]]   ondansetron (ZOFRAN-ODT) 4 MG disintegrating tablet    Sig: Take 1 tablet (4 mg total) by mouth every 8 (eight) hours as needed for nausea or vomiting.    Dispense:  15 tablet    Refill:  4    Supervising Provider:   Reva Bores [[id number]]   scopolamine (TRANSDERM-SCOP) 1 MG/3DAYS    Sig: Place 1 patch (1.5 mg total) onto the skin every 3 (three) days.    Dispense:  10 patch    Refill:  1    Supervising Provider:   Samara Snide   DISCONTD: NIFEdipine (PROCARDIA) capsule 10 mg   NIFEdipine (PROCARDIA) capsule 10 mg   scopolamine (TRANSDERM-SCOP) 1 MG/3DAYS 1.5 mg    ASSESSMENT   1. Preterm contractions   2. [redacted] weeks gestation of pregnancy   3. Nausea and vomiting, unspecified vomiting type   4. Dehydration     PLAN  Discharge home in stable condition with return precautions.   - Pts care manager was contacted to see if she was receiving services from them and she is. The care manager has reached out multiple times but the pt has not answered her calls. She was planning on going out to her house to check on her sometime this week. - Pt medications were provided to her before discharge since she is unable to pick them up from the pharmacy. - Pt was educated on the importance of taking her medications and staying hydrated.  - Pt has an prenatal appointment scheduled for 4/4 and was educated on how important it is that she go to this appointment.     Ineze Serrao Danella Deis) Suzie Portela, MSN, CNM  [facility]  12/04/2023 9:38 PM

## 2023-12-05 LAB — GC/CHLAMYDIA PROBE AMP (~~LOC~~) NOT AT ARMC
Chlamydia: NEGATIVE
Comment: NEGATIVE
Comment: NORMAL
Neisseria Gonorrhea: NEGATIVE

## 2023-12-07 ENCOUNTER — Other Ambulatory Visit: Payer: Self-pay | Admitting: Nurse Practitioner

## 2023-12-07 ENCOUNTER — Other Ambulatory Visit: Payer: Self-pay

## 2023-12-07 ENCOUNTER — Other Ambulatory Visit (HOSPITAL_COMMUNITY): Payer: Self-pay

## 2023-12-07 ENCOUNTER — Other Ambulatory Visit: Payer: Self-pay | Admitting: Family Medicine

## 2023-12-07 MED ORDER — FAMOTIDINE 20 MG PO TABS
20.0000 mg | ORAL_TABLET | Freq: Two times a day (BID) | ORAL | 0 refills | Status: AC
  Filled 2023-12-07: qty 30, 15d supply, fill #0

## 2023-12-07 MED ORDER — CYCLOBENZAPRINE HCL 10 MG PO TABS
10.0000 mg | ORAL_TABLET | Freq: Three times a day (TID) | ORAL | 0 refills | Status: AC | PRN
  Filled 2023-12-07: qty 20, 7d supply, fill #0

## 2023-12-07 MED ORDER — FERROUS SULFATE 325 (65 FE) MG PO TABS
325.0000 mg | ORAL_TABLET | Freq: Every day | ORAL | 0 refills | Status: DC
Start: 2023-12-07 — End: 2023-12-28
  Filled 2023-12-07: qty 30, 30d supply, fill #0

## 2023-12-07 MED ORDER — NIFEDIPINE ER 30 MG PO TB24
30.0000 mg | ORAL_TABLET | Freq: Every day | ORAL | 0 refills | Status: DC
Start: 2023-12-04 — End: 2023-12-28
  Filled 2023-12-07: qty 90, 90d supply, fill #0

## 2023-12-10 ENCOUNTER — Other Ambulatory Visit: Payer: Self-pay

## 2023-12-10 ENCOUNTER — Other Ambulatory Visit (HOSPITAL_COMMUNITY): Payer: Self-pay | Admitting: Family Medicine

## 2023-12-10 ENCOUNTER — Ambulatory Visit (INDEPENDENT_AMBULATORY_CARE_PROVIDER_SITE_OTHER): Admitting: Family Medicine

## 2023-12-10 ENCOUNTER — Telehealth: Payer: Self-pay

## 2023-12-10 VITALS — BP 108/80 | HR 96 | Wt 167.9 lb

## 2023-12-10 DIAGNOSIS — O0993 Supervision of high risk pregnancy, unspecified, third trimester: Secondary | ICD-10-CM

## 2023-12-10 DIAGNOSIS — O99019 Anemia complicating pregnancy, unspecified trimester: Secondary | ICD-10-CM

## 2023-12-10 DIAGNOSIS — R87613 High grade squamous intraepithelial lesion on cytologic smear of cervix (HGSIL): Secondary | ICD-10-CM

## 2023-12-10 DIAGNOSIS — Z3A32 32 weeks gestation of pregnancy: Secondary | ICD-10-CM

## 2023-12-10 DIAGNOSIS — E010 Iodine-deficiency related diffuse (endemic) goiter: Secondary | ICD-10-CM

## 2023-12-10 DIAGNOSIS — E538 Deficiency of other specified B group vitamins: Secondary | ICD-10-CM | POA: Diagnosis not present

## 2023-12-10 DIAGNOSIS — O99013 Anemia complicating pregnancy, third trimester: Secondary | ICD-10-CM | POA: Diagnosis not present

## 2023-12-10 DIAGNOSIS — O0990 Supervision of high risk pregnancy, unspecified, unspecified trimester: Secondary | ICD-10-CM

## 2023-12-10 MED ORDER — CYANOCOBALAMIN 1000 MCG/ML IJ SOLN: 1000.0000 ug | Freq: Once | INTRAMUSCULAR | 0 refills | Status: AC

## 2023-12-10 NOTE — Progress Notes (Signed)
   PRENATAL VISIT NOTE  Subjective:  Sharon Barnett is a 33 y.o. G3P1011 at [redacted]w[redacted]d being seen today for ongoing prenatal care.  She is currently monitored for the following issues for this high-risk pregnancy and has Migraine; Supervision of high risk pregnancy, antepartum; Rubella non-immune status, antepartum; HGSIL (high grade squamous intraepithelial lesion) on Pap smear of cervix; Fibroid uterus; Uterine synechiae; Hyperemesis gravidarum; Anemia during pregnancy; Anemia affecting pregnancy in third trimester; and B12 deficiency on their problem list.  Patient reports no complaints.  Contractions: Irritability. Vag. Bleeding: None.  Movement: Present. Denies leaking of fluid.   The following portions of the patient's history were reviewed and updated as appropriate: allergies, current medications, past family history, past medical history, past social history, past surgical history and problem list.   Objective:   Vitals:   12/10/23 1155  BP: 108/80  Pulse: 96  Weight: 167 lb 14.4 oz (76.2 kg)    Fetal Status: Fetal Heart Rate (bpm): 133 Fundal Height: 33 cm Movement: Present  Presentation: Vertex  General:  Alert, oriented and cooperative. Patient is in no acute distress. + enlarged thyroid, non tender. Reports globus sensation with swallowing  Skin: Skin is warm and dry. No rash noted.   Cardiovascular: Normal heart rate noted  Respiratory: Normal respiratory effort, no problems with respiration noted  Abdomen: Soft, gravid, appropriate for gestational age.  Pain/Pressure: Present (pelvic pressure)     Pelvic: Cervical exam performed in the presence of a chaperone Dilation: 1 Effacement (%): Thick Station: Ballotable  Extremities: Normal range of motion.  Edema: Trace (feet)  Mental Status: Normal mood and affect. Normal behavior. Normal judgment and thought content.   Assessment and Plan:  Pregnancy: G3P1011 at [redacted]w[redacted]d 1. Supervision of high risk pregnancy, antepartum  (Primary) Has been in MAU multiple times -- last was 4/1, mostly for HG FH 33 Vigorous movement Cervical exam check given her frequent contractions-- not advancement of dilation from MAU visit. Is using her procardia. Reports she "wants to be done with pregnancy"-- she is asking about timing of delivery. Reviewed that typically 40+ weeks unless there are concerns about her baby's growth or her BP is elevated Confirmed she is taking scopolamine and zofran to control her N/V  TWG= 25 lb 14.4 oz (11.7 kg) which is at goal   2. HGSIL (high grade squamous intraepithelial lesion) on Pap smear of cervix Colpo postpartum  Reviewed with client that she needs a colposcopy postpartum and the importance of this  3. Anemia during pregnancy Last HGB 7.8 in MAU, severe Fe deficiency, low B12 as well Orders placed in navigator- plan for 500mg  Weekly x2 Rx for B12 sent to pharmacy. Will bring for administration at [facility] Follow up labs ~4 weeks after infusions (get Anemia B profile to include B12)  4. Thyromegaly Diffusely enlarged, non tender.  Never had thyroid fxn test done - TSH + free T4 - T3, free  Preterm labor symptoms and general obstetric precautions including but not limited to vaginal bleeding, contractions, leaking of fluid and fetal movement were reviewed in detail with the patient. Please refer to After Visit Summary for other counseling recommendations.   Return in about 2 weeks (around 12/24/2023) for Routine prenatal care.  Future Appointments  Date Time Provider Department Center  12/14/2023 11:30 AM [facility] US4 [facility]  12/26/2023 10:15 AM Warden Fillers, MD [facility]  01/08/2024 10:55 AM Carlynn Herald, CNM [facility]    Federico Flake, MD

## 2023-12-10 NOTE — Telephone Encounter (Signed)
 Hello,  Pt is scheduled for 12/17/23 @ 9AM at [facility].  Auth Submission: NO AUTH NEEDED Site of care: Site of care: [facility] Payer: [place] [employer]ICA[employer]  Medication & CPT/J Code(s) submitted: Venofer (Iron Sucrose) J1756 Route of submission (phone, fax, portal):  Phone # Fax # Auth type: Buy/Bill HB Units/visits requested: 500mg , weekly x2doses Reference number:  Approval from: 12/10/23 to 05/04/24

## 2023-12-11 ENCOUNTER — Encounter: Payer: Self-pay | Admitting: Family Medicine

## 2023-12-11 DIAGNOSIS — E049 Nontoxic goiter, unspecified: Secondary | ICD-10-CM | POA: Insufficient documentation

## 2023-12-11 LAB — TSH+FREE T4
Free T4: 1.07 ng/dL (ref 0.82–1.77)
TSH: 1.16 u[IU]/mL (ref 0.450–4.500)

## 2023-12-11 LAB — T3, FREE: T3, Free: 2.8 pg/mL (ref 2.0–4.4)

## 2023-12-12 ENCOUNTER — Other Ambulatory Visit: Payer: Self-pay

## 2023-12-14 ENCOUNTER — Ambulatory Visit: Payer: Medicaid Other | Attending: Obstetrics

## 2023-12-17 ENCOUNTER — Inpatient Hospital Stay (HOSPITAL_COMMUNITY): Admission: RE | Admit: 2023-12-17 | Source: Ambulatory Visit

## 2023-12-19 ENCOUNTER — Other Ambulatory Visit (HOSPITAL_COMMUNITY): Payer: Self-pay

## 2023-12-20 ENCOUNTER — Ambulatory Visit (HOSPITAL_BASED_OUTPATIENT_CLINIC_OR_DEPARTMENT_OTHER): Admitting: Obstetrics and Gynecology

## 2023-12-20 ENCOUNTER — Ambulatory Visit: Attending: Obstetrics

## 2023-12-20 ENCOUNTER — Other Ambulatory Visit: Payer: Self-pay | Admitting: Obstetrics

## 2023-12-20 ENCOUNTER — Other Ambulatory Visit: Payer: Self-pay | Admitting: *Deleted

## 2023-12-20 VITALS — BP 119/80 | HR 102

## 2023-12-20 DIAGNOSIS — O0990 Supervision of high risk pregnancy, unspecified, unspecified trimester: Secondary | ICD-10-CM | POA: Diagnosis present

## 2023-12-20 DIAGNOSIS — Z3A33 33 weeks gestation of pregnancy: Secondary | ICD-10-CM

## 2023-12-20 DIAGNOSIS — O3413 Maternal care for benign tumor of corpus uteri, third trimester: Secondary | ICD-10-CM | POA: Diagnosis not present

## 2023-12-20 DIAGNOSIS — O99013 Anemia complicating pregnancy, third trimester: Secondary | ICD-10-CM | POA: Insufficient documentation

## 2023-12-20 DIAGNOSIS — Z2839 Other underimmunization status: Secondary | ICD-10-CM | POA: Insufficient documentation

## 2023-12-20 DIAGNOSIS — O403XX0 Polyhydramnios, third trimester, not applicable or unspecified: Secondary | ICD-10-CM | POA: Diagnosis not present

## 2023-12-20 DIAGNOSIS — N856 Intrauterine synechiae: Secondary | ICD-10-CM | POA: Diagnosis present

## 2023-12-20 DIAGNOSIS — O409XX0 Polyhydramnios, unspecified trimester, not applicable or unspecified: Secondary | ICD-10-CM

## 2023-12-20 DIAGNOSIS — Z3A24 24 weeks gestation of pregnancy: Secondary | ICD-10-CM | POA: Insufficient documentation

## 2023-12-20 DIAGNOSIS — O09899 Supervision of other high risk pregnancies, unspecified trimester: Secondary | ICD-10-CM | POA: Insufficient documentation

## 2023-12-20 DIAGNOSIS — D259 Leiomyoma of uterus, unspecified: Secondary | ICD-10-CM | POA: Diagnosis not present

## 2023-12-20 NOTE — Progress Notes (Signed)
  Maternal-Fetal Medicine Consultation Name: Sharon Barnett MRN: [id number] G3 P1011 at 33w 4d gestation. Patient had polyhydramnios at previous ultrasound.  She returned for fetal growth assessment. She reports she does not have gestational diabetes. Obstetric history is significant for a term vaginal delivery.  Ultrasound On today's ultrasound, severe polyhydramnios is seen (AFI 34 cm).  Good fetal activity is present.  Fetal growth is appropriate for gestational age.  Cephalic presentation.  Fetal stomach, kidneys and intracranial structures appear normal.  I explained the finding of severe polyhydramnios.  In the absence of gestational diabetes, severe polyhydramnios can be associated with fetal anomalies that may not be detected until after birth. Patient reports she has abdominal discomfort.  I discussed amnio reduction that may be performed if she has severe back pain or shortness of breath.  Polyhydramnios can lead to preterm labor. I instructed the patient to come to the MAU if she has severe maternal symptoms.  If severe polyhydramnios persists, consider delivery at [redacted] weeks gestation especially if maternal symptoms are present.  Recommendations - Weekly BPP till delivery.   Consultation including face-to-face (more than 50%) counseling 20 minutes.

## 2023-12-24 ENCOUNTER — Inpatient Hospital Stay (HOSPITAL_COMMUNITY)
Admission: AD | Admit: 2023-12-24 | Discharge: 2023-12-24 | Disposition: A | Discharge status: Home / Self Care | Attending: Obstetrics and Gynecology | Admitting: Obstetrics and Gynecology

## 2023-12-24 ENCOUNTER — Encounter (HOSPITAL_COMMUNITY)

## 2023-12-24 DIAGNOSIS — O479 False labor, unspecified: Secondary | ICD-10-CM | POA: Diagnosis not present

## 2023-12-24 DIAGNOSIS — O4700 False labor before 37 completed weeks of gestation, unspecified trimester: Secondary | ICD-10-CM

## 2023-12-24 DIAGNOSIS — Z3A34 34 weeks gestation of pregnancy: Secondary | ICD-10-CM

## 2023-12-24 DIAGNOSIS — O403XX0 Polyhydramnios, third trimester, not applicable or unspecified: Secondary | ICD-10-CM

## 2023-12-24 DIAGNOSIS — O4703 False labor before 37 completed weeks of gestation, third trimester: Secondary | ICD-10-CM | POA: Diagnosis present

## 2023-12-24 LAB — WET PREP, GENITAL
Clue Cells Wet Prep HPF POC: NONE SEEN
Sperm: NONE SEEN
Trich, Wet Prep: NONE SEEN
WBC, Wet Prep HPF POC: >=10 — AB (ref <10)
Yeast Wet Prep HPF POC: NONE SEEN

## 2023-12-24 LAB — COMPREHENSIVE METABOLIC PANEL WITH GFR
ALT: 8 U/L (ref 0–44)
AST: 15 U/L (ref 15–41)
Albumin: 2.4 g/dL — ABNORMAL LOW (ref 3.5–5.0)
Alkaline Phosphatase: 97 U/L (ref 38–126)
Anion gap: 11 (ref 5–15)
BUN: 9 mg/dL (ref 6–20)
CO2: 19 mmol/L — ABNORMAL LOW (ref 22–32)
Calcium: 8.5 mg/dL — ABNORMAL LOW (ref 8.9–10.3)
Chloride: 106 mmol/L (ref 98–111)
Creatinine, Ser: 0.8 mg/dL (ref 0.44–1.00)
GFR, Estimated: >60 mL/min (ref >60)
Glucose, Bld: 71 mg/dL (ref 70–99)
Potassium: 3.4 mmol/L — ABNORMAL LOW (ref 3.5–5.1)
Sodium: 136 mmol/L (ref 135–145)
Total Bilirubin: 0.8 mg/dL (ref 0.0–1.2)
Total Protein: 6 g/dL — ABNORMAL LOW (ref 6.5–8.1)

## 2023-12-24 LAB — CBC
HCT: 23.6 % — ABNORMAL LOW (ref 36.0–46.0)
Hemoglobin: 7 g/dL — ABNORMAL LOW (ref 12.0–15.0)
MCH: 24.1 pg — ABNORMAL LOW (ref 26.0–34.0)
MCHC: 29.7 g/dL — ABNORMAL LOW (ref 30.0–36.0)
MCV: 81.1 fL (ref 80.0–100.0)
Platelets: 297 10*3/uL (ref 150–400)
RBC: 2.91 MIL/uL — ABNORMAL LOW (ref 3.87–5.11)
RDW: 14 % (ref 11.5–15.5)
WBC: 7.9 10*3/uL (ref 4.0–10.5)
nRBC: 0 % (ref 0.0–0.2)

## 2023-12-24 LAB — TYPE AND SCREEN
ABO/RH(D): A POS
Antibody Screen: NEGATIVE

## 2023-12-24 LAB — FETAL FIBRONECTIN: Fetal Fibronectin: POSITIVE — AB

## 2023-12-24 MED ORDER — ACETAMINOPHEN 500 MG PO TABS
1000.0000 mg | ORAL_TABLET | Freq: Once | ORAL | Status: AC
  Administered 2023-12-24: 1000 mg via ORAL
  Filled 2023-12-24: qty 2

## 2023-12-24 MED ORDER — NIFEDIPINE 10 MG PO CAPS
10.0000 mg | ORAL_CAPSULE | ORAL | Status: DC | PRN
  Administered 2023-12-24: 10 mg via ORAL
  Filled 2023-12-24: qty 1

## 2023-12-24 MED ORDER — CYCLOBENZAPRINE HCL 5 MG PO TABS
10.0000 mg | ORAL_TABLET | Freq: Once | ORAL | Status: AC
  Administered 2023-12-24: 10 mg via ORAL
  Filled 2023-12-24: qty 2

## 2023-12-24 MED ORDER — LACTATED RINGERS IV BOLUS
1000.0000 mL | Freq: Once | INTRAVENOUS | Status: AC
  Administered 2023-12-24: 1000 mL via INTRAVENOUS

## 2023-12-24 NOTE — MAU Provider Note (Signed)
 EMS Preterm CTX     [name] is a 33 y.o. G34P1011 pregnant female at [redacted]w[redacted]d who presents to MAU today with complaint of preterm CTX not relieved by home Procardia . Pt states since 2300 4/20 had painful CTX about 2-3 minutes apart, denies recent coitus or vaginal penetration.  Pt states last dose of procardia  0300 4/21.  Pt states green mucous plug lost this morning but denies VB or LOF. States has some vaginal irritation but no new rashes or pruritus reported.  Reports different types of movements of fetus than she's use to however feeling baby moving same amount.    Receives care at [facility]. Prenatal records reviewed. Polyhydramnios (AFI 34cm 4/17) with appropriate fetal growth and fetal activity and normal stomach / kidneys / intracranial structures. No GDM. She was offered amnio-reduction if severe back pain or SOB by MFM.   Pertinent items noted in HPI and remainder of comprehensive ROS otherwise negative.   O BP 129/75   Pulse 73   Temp 97.8 F (36.6 C) (Oral)   Resp 20   LMP 04/29/2023  Physical Exam Vitals and nursing note reviewed. Exam conducted with a chaperone present.  Constitutional:      General: She is not in acute distress.    Appearance: Normal appearance. She is normal weight. She is not ill-appearing.  HENT:     Head: Normocephalic and atraumatic.     Right Ear: External ear normal.     Left Ear: External ear normal.     Nose: Nose normal. No congestion.     Mouth/Throat:     Mouth: Mucous membranes are moist.     Pharynx: Oropharynx is clear.  Eyes:     Extraocular Movements: Extraocular movements intact.     Conjunctiva/sclera: Conjunctivae normal.  Cardiovascular:     Rate and Rhythm: Normal rate.  Pulmonary:     Effort: Pulmonary effort is normal. No respiratory distress.  Abdominal:     General: There is no distension.     Palpations: Abdomen is soft.     Tenderness: There is no abdominal tenderness.     Comments: gravid  Genitourinary:     General: Normal vulva.     Vagina: No vaginal discharge.     Comments: CVE 1.5/50/-3 Musculoskeletal:        General: No swelling. Normal range of motion.     Cervical back: Normal range of motion.  Skin:    General: Skin is warm and dry.  Neurological:     Mental Status: She is alert and oriented to person, place, and time. Mental status is at baseline.     Motor: No weakness.     Gait: Gait normal.  Psychiatric:        Mood and Affect: Mood normal.        Behavior: Behavior normal.        Thought Content: Thought content normal.        Judgment: Judgment normal.   NST: 125bpm, moderate variability, +accels, no decels, CTX 3-70mins   MDM: MAU Course:  Will start IVF, Procardia  and obtain labs while awaiting results.    FFN positive  Wet prep negative  GC collected CBC Hgb 7.0, MCV 81 * CMP K3.4, otherwise reassuring RPR pending T/S A+  Pt informed that the ultrasound is considered a limited OB ultrasound and is not intended to be a complete ultrasound exam.  Patient also informed that the ultrasound is not being completed with the intent  of assessing for fetal or placental anomalies or any pelvic abnormalities.  Explained that the purpose of today's ultrasound is to assess for  AFI.  Patient acknowledges the purpose of the exam and the limitations of the study.    My interpretation: 32 cm, largest vertical pocket 12cm    Spoke with Dr. Arcola Kocher given back pain that is worsening, stable CVE and spaced CTX and stable AFI, he's comfortable with her previously follow up later this week.  Stable for d/c.   AP #[redacted] weeks gestation  #Severe polyhydramnios #Preterm CTX - continue previously scheduled appts 4/23 Sharon Barnett, 4/24 MFM - continue home procardia  PRN ctx   Discharge from MAU in stable condition with strict/usual precautions Follow up at [facility] as scheduled for ongoing prenatal care  Allergies as of 12/24/2023   No Known Allergies      Medication List     TAKE these  medications    aspirin  EC 81 MG tablet Take 1 tablet (81 mg total) by mouth daily.   cyclobenzaprine  10 MG tablet Commonly known as: FLEXERIL  Take 1 tablet (10 mg total) by mouth 3 (three) times daily as needed for muscle spasms.   diphenhydrAMINE  25 mg capsule Commonly known as: BENADRYL  Take 1 capsule (25 mg total) by mouth every 6 (six) hours as needed (headache).   famotidine  20 MG tablet Commonly known as: Pepcid  Take 1 tablet (20 mg total) by mouth 2 (two) times daily.   glycopyrrolate  2 MG tablet Commonly known as: ROBINUL  Take 1 tablet (2 mg total) by mouth 3 (three) times daily as needed (spitting, nausea).   Iron  (Ferrous Sulfate ) 325 (65 Fe) MG Tabs Take 1 tablet by mouth daily.   ferrous sulfate  325 (65 FE) MG tablet Take 1 tablet (325 mg total) by mouth daily.   metoCLOPramide  10 MG tablet Commonly known as: REGLAN  Take 1 tablet (10 mg total) by mouth every 8 (eight) hours as needed for nausea (headache).   NIFEdipine  10 MG capsule Commonly known as: Procardia  Take 1 capsule (10 mg total) by mouth 3 (three) times daily as needed (Contractions).   NIFEdipine  30 MG 24 hr tablet Commonly known as: ADALAT  CC Take 1 tablet (30 mg total) by mouth daily.   NIFEdipine  30 MG 24 hr tablet Commonly known as: ADALAT  CC Take 1 tablet (30 mg total) by mouth daily.   ondansetron  4 MG disintegrating tablet Commonly known as: ZOFRAN -ODT Dissolve 1 tablet (4 mg total) by mouth every 8 (eight) hours as needed for nausea or vomiting.   pantoprazole  40 MG tablet Commonly known as: Protonix  Take 1 tablet (40 mg total) by mouth daily.   promethazine  25 MG tablet Commonly known as: PHENERGAN  Take 1 tablet (25 mg total) by mouth every 6 (six) hours as needed for nausea or vomiting.   scopolamine  1 MG/3DAYS Commonly known as: Transderm-Scop Place 1 patch (1.5 mg total) onto the skin every 3 (three) days.   scopolamine  1 MG/3DAYS Commonly known as: TRANSDERM-SCOP Place  1 patch (1.5 mg total) onto the skin behind the ear every 3 (three) days.        Ebony Goldstein, MD 12/24/2023 3:50 PM

## 2023-12-24 NOTE — Discharge Instructions (Signed)
 Follow up with Dr. Racheal Buddle 4/23 and MFM 4/24 as previously scheduled

## 2023-12-25 LAB — RPR: RPR Ser Ql: NONREACTIVE

## 2023-12-25 LAB — GC/CHLAMYDIA PROBE AMP (~~LOC~~) NOT AT ARMC
Chlamydia: NEGATIVE
Comment: NEGATIVE
Comment: NORMAL
Neisseria Gonorrhea: NEGATIVE

## 2023-12-26 ENCOUNTER — Other Ambulatory Visit: Payer: Self-pay | Admitting: Obstetrics and Gynecology

## 2023-12-26 ENCOUNTER — Ambulatory Visit: Admitting: Obstetrics and Gynecology

## 2023-12-26 ENCOUNTER — Other Ambulatory Visit: Payer: Self-pay

## 2023-12-26 VITALS — BP 122/79 | HR 91 | Wt 174.0 lb

## 2023-12-26 DIAGNOSIS — D259 Leiomyoma of uterus, unspecified: Secondary | ICD-10-CM

## 2023-12-26 DIAGNOSIS — O403XX0 Polyhydramnios, third trimester, not applicable or unspecified: Secondary | ICD-10-CM | POA: Insufficient documentation

## 2023-12-26 DIAGNOSIS — Z3A34 34 weeks gestation of pregnancy: Secondary | ICD-10-CM | POA: Diagnosis not present

## 2023-12-26 DIAGNOSIS — O0993 Supervision of high risk pregnancy, unspecified, third trimester: Secondary | ICD-10-CM | POA: Diagnosis not present

## 2023-12-26 DIAGNOSIS — Z2839 Other underimmunization status: Secondary | ICD-10-CM

## 2023-12-26 DIAGNOSIS — O99013 Anemia complicating pregnancy, third trimester: Secondary | ICD-10-CM | POA: Diagnosis not present

## 2023-12-26 DIAGNOSIS — R87613 High grade squamous intraepithelial lesion on cytologic smear of cervix (HGSIL): Secondary | ICD-10-CM

## 2023-12-26 DIAGNOSIS — O0990 Supervision of high risk pregnancy, unspecified, unspecified trimester: Secondary | ICD-10-CM

## 2023-12-26 NOTE — Progress Notes (Signed)
   PRENATAL VISIT NOTE  Subjective:  Sharon Barnett is a 33 y.o. G3P1011 at [redacted]w[redacted]d being seen today for ongoing prenatal care.  She is currently monitored for the following issues for this high-risk pregnancy and has Migraine; Supervision of high risk pregnancy, antepartum; Rubella non-immune status, antepartum; HGSIL (high grade squamous intraepithelial lesion) on Pap smear of cervix; Fibroid uterus; Uterine synechiae; Hyperemesis gravidarum; Anemia during pregnancy; Anemia affecting pregnancy in third trimester; B12 deficiency; Enlarged thyroid ; and Polyhydramnios affecting pregnancy in third trimester on their problem list.  Patient doing well with no acute concerns today. She reports no complaints.  Contractions: Regular.  .  Movement: Present. Denies leaking of fluid.   The following portions of the patient's history were reviewed and updated as appropriate: allergies, current medications, past family history, past medical history, past social history, past surgical history and problem list. Problem list updated.  Objective:   Vitals:   12/26/23 1043  BP: 122/79  Pulse: 91  Weight: 174 lb (78.9 kg)    Fetal Status: Fetal Heart Rate (bpm): 125 Fundal Height: 37 cm Movement: Present     General:  Alert, oriented and cooperative. Patient is in no acute distress.  Skin: Skin is warm and dry. No rash noted.   Cardiovascular: Normal heart rate noted  Respiratory: Normal respiratory effort, no problems with respiration noted  Abdomen: Soft, gravid, appropriate for gestational age.  Pain/Pressure: Present     Pelvic: Cervical exam deferred        Extremities: Normal range of motion.  Edema: Trace  Mental Status:  Normal mood and affect. Normal behavior. Normal judgment and thought content.   Assessment and Plan:  Pregnancy: G3P1011 at [redacted]w[redacted]d  1. [redacted] weeks gestation of pregnancy (Primary)   2. Uterine leiomyoma, unspecified location   3. Supervision of high risk pregnancy,  antepartum Continue routine prenatal care  4. Rubella non-immune status, antepartum Treat after delivery  5. HGSIL (high grade squamous intraepithelial lesion) on Pap smear of cervix Colpo post delivery  6. Anemia affecting pregnancy in third trimester Last h/h was 7/23.6 Will try to get infusion scheduled  7. Polyhydramnios affecting pregnancy in third trimester AFI 33, pt now has weekly testing, possible delivery at 37 weeks pending MFM recs  Preterm labor symptoms and general obstetric precautions including but not limited to vaginal bleeding, contractions, leaking of fluid and fetal movement were reviewed in detail with the patient.  Please refer to After Visit Summary for other counseling recommendations.   Return in about 1 week (around 01/02/2024) for [facility], in person, 36 weeks swabs.   Avie Boeck, MD Faculty Attending Center for [facility]

## 2023-12-27 ENCOUNTER — Other Ambulatory Visit

## 2023-12-27 ENCOUNTER — Ambulatory Visit

## 2023-12-27 ENCOUNTER — Ambulatory Visit: Attending: Obstetrics and Gynecology | Admitting: Obstetrics

## 2023-12-27 DIAGNOSIS — O99283 Endocrine, nutritional and metabolic diseases complicating pregnancy, third trimester: Secondary | ICD-10-CM | POA: Diagnosis not present

## 2023-12-27 DIAGNOSIS — Z3A34 34 weeks gestation of pregnancy: Secondary | ICD-10-CM

## 2023-12-27 DIAGNOSIS — O3413 Maternal care for benign tumor of corpus uteri, third trimester: Secondary | ICD-10-CM | POA: Diagnosis not present

## 2023-12-27 DIAGNOSIS — E049 Nontoxic goiter, unspecified: Secondary | ICD-10-CM

## 2023-12-27 DIAGNOSIS — O403XX0 Polyhydramnios, third trimester, not applicable or unspecified: Secondary | ICD-10-CM

## 2023-12-27 DIAGNOSIS — D259 Leiomyoma of uterus, unspecified: Secondary | ICD-10-CM | POA: Insufficient documentation

## 2023-12-27 NOTE — Procedures (Signed)
 Sharon Barnett Apr 08, 1991 [redacted]w[redacted]d  Fetus A Non-Stress Test Interpretation for 12/27/23  Indication:  Polyhydramnios, enlarged thyroid   Fetal Heart Rate A Mode: External Baseline Rate (A): 130 bpm Variability: Moderate Accelerations: 15 x 15 Decelerations: None Multiple birth?: No  Uterine Activity Mode: Palpation, Toco Contraction Frequency (min): Irritability - pt reports discomfort Contraction Quality: Mild Resting Tone Palpated: Relaxed  Interpretation (Fetal Testing) Nonstress Test Interpretation: Reactive Comments: Reviewed with Dr. Grayland Le

## 2023-12-28 ENCOUNTER — Encounter (HOSPITAL_COMMUNITY): Payer: Self-pay | Admitting: Obstetrics and Gynecology

## 2023-12-28 ENCOUNTER — Inpatient Hospital Stay (HOSPITAL_COMMUNITY): Admission: RE | Admit: 2023-12-28 | Source: Ambulatory Visit

## 2023-12-28 ENCOUNTER — Inpatient Hospital Stay (HOSPITAL_COMMUNITY)
Admission: AD | Admit: 2023-12-28 | Discharge: 2023-12-28 | Disposition: A | Discharge status: Home / Self Care | Attending: Obstetrics and Gynecology | Admitting: Obstetrics and Gynecology

## 2023-12-28 DIAGNOSIS — O4703 False labor before 37 completed weeks of gestation, third trimester: Secondary | ICD-10-CM | POA: Diagnosis present

## 2023-12-28 DIAGNOSIS — O4700 False labor before 37 completed weeks of gestation, unspecified trimester: Secondary | ICD-10-CM

## 2023-12-28 DIAGNOSIS — O403XX0 Polyhydramnios, third trimester, not applicable or unspecified: Secondary | ICD-10-CM | POA: Diagnosis not present

## 2023-12-28 DIAGNOSIS — D509 Iron deficiency anemia, unspecified: Secondary | ICD-10-CM

## 2023-12-28 DIAGNOSIS — Z3A35 35 weeks gestation of pregnancy: Secondary | ICD-10-CM

## 2023-12-28 DIAGNOSIS — O99013 Anemia complicating pregnancy, third trimester: Secondary | ICD-10-CM | POA: Diagnosis not present

## 2023-12-28 DIAGNOSIS — R350 Frequency of micturition: Secondary | ICD-10-CM | POA: Insufficient documentation

## 2023-12-28 DIAGNOSIS — O403XX1 Polyhydramnios, third trimester, fetus 1: Secondary | ICD-10-CM

## 2023-12-28 HISTORY — DX: Anemia, unspecified: D64.9

## 2023-12-28 LAB — URINALYSIS, ROUTINE W REFLEX MICROSCOPIC
Bilirubin Urine: NEGATIVE
Glucose, UA: NEGATIVE mg/dL
Hgb urine dipstick: NEGATIVE
Ketones, ur: 20 mg/dL — AB
Leukocytes,Ua: NEGATIVE
Nitrite: NEGATIVE
Protein, ur: NEGATIVE mg/dL
Specific Gravity, Urine: 1.016 (ref 1.005–1.030)
pH: 6 (ref 5.0–8.0)

## 2023-12-28 LAB — WET PREP, GENITAL
Clue Cells Wet Prep HPF POC: NONE SEEN
Sperm: NONE SEEN
Trich, Wet Prep: NONE SEEN
WBC, Wet Prep HPF POC: >=10 — AB (ref <10)

## 2023-12-28 LAB — OB RESULTS CONSOLE GBS: GBS: NEGATIVE

## 2023-12-28 MED ORDER — ACCRUFER 30 MG PO CAPS: 1.0000 | ORAL_CAPSULE | Freq: Every day | ORAL | 0 refills | Status: AC

## 2023-12-28 MED ORDER — MORPHINE SULFATE (PF) 4 MG/ML IV SOLN
4.0000 mg | Freq: Once | INTRAVENOUS | Status: AC
  Administered 2023-12-28: 4 mg via INTRAVENOUS
  Filled 2023-12-28: qty 1

## 2023-12-28 MED ORDER — LACTATED RINGERS IV BOLUS
1000.0000 mL | Freq: Once | INTRAVENOUS | Status: AC
Start: 2023-12-28 — End: 2023-12-28
  Administered 2023-12-28: 1000 mL via INTRAVENOUS

## 2023-12-28 MED ORDER — ZOLPIDEM TARTRATE 5 MG PO TABS: 5.0000 mg | ORAL_TABLET | Freq: Every evening | ORAL | 0 refills | Status: DC | PRN

## 2023-12-28 MED ORDER — SODIUM CHLORIDE 0.9 % IV SOLN
25.0000 mg | Freq: Once | INTRAVENOUS | Status: AC
  Administered 2023-12-28: 25 mg via INTRAVENOUS
  Filled 2023-12-28: qty 1

## 2023-12-28 MED ORDER — SODIUM CHLORIDE 0.9 % IV SOLN
510.0000 mg | Freq: Once | INTRAVENOUS | Status: AC
  Administered 2023-12-28: 510 mg via INTRAVENOUS
  Filled 2023-12-28: qty 17

## 2023-12-28 MED ORDER — LACTATED RINGERS IV BOLUS
250.0000 mL | Freq: Once | INTRAVENOUS | Status: AC
  Administered 2023-12-28: 250 mL via INTRAVENOUS

## 2023-12-28 NOTE — MAU Note (Addendum)
 Sharon Barnett is a 33 y.o. at [redacted]w[redacted]d here in MAU reporting: calls from desk, pt c/o pressure and wanting to push, "back to back".  Been contracting all day to day. No bleeding or leaking.   Onset of complaint: 0300 Pain score: 10+, "15' Vitals:   12/28/23 1830  BP: 126/66  Pulse: 86  Resp: 18  Temp: 98.5 F (36.9 C)     FHT:130 Lab orders placed from triage:

## 2023-12-28 NOTE — MAU Provider Note (Addendum)
 CC:  Chief Complaint  Patient presents with   Labor Eval     Event Date/Time   First Provider Initiated Contact with Patient 12/28/23 1856      HPI: Sharon Barnett is a 33 y.o. year old G84P1011 female at [redacted]w[redacted]d weeks gestation who presents to MAU reporting frequent contractions all day today.  Denies vaginal bleeding, leaking of fluid.  Has severe polyhydramnios with last AFI at 33 cm.. Positive fetal movement.  Associated symptoms.  Positive for vaginal irritation and urinary frequency.  Negative for fever, chills, hematuria.   O: Patient Vitals for the past 24 hrs:  BP Temp Pulse Resp  12/28/23 1830 126/66 98.5 F (36.9 C) 86 18    General: NAD Heart: Regular rate Lungs: Normal rate and effort Abd: Soft, NT, Gravid, S=D Pelvic: NEFG, no blood.  Dilation: 1 Effacement (%): Thick Cervical Position: Middle Station: Ballotable Presentation: Vertex (By bedside ultrasound) Exam by:: Pressley Brome, CNM  EFM: 125, Moderate variability, and X10 accelerations, no decelerations Toco: Contractions every 1-5 minutes, moderate  Pt informed that the ultrasound is considered a limited OB ultrasound and is not intended to be a complete ultrasound exam.  Patient also informed that the ultrasound is not being completed with the intent of assessing for fetal or placental anomalies or any pelvic abnormalities.  Explained that the purpose of today's ultrasound is to assess for  presentation.  Patient acknowledges the purpose of the exam and the limitations of the study.    MAU Course Orders Placed This Encounter  Procedures   Wet prep, genital   Culture, beta strep (group b only)   Urinalysis, Routine w reflex microscopic -Urine, Clean Catch   Insert peripheral IV   Meds ordered this encounter  Medications   lactated ringers  bolus 1,000 mL   Cervix unchanged after 1 hr 45 min. Still contracting very painfully although less frequently. Offered IV pain meds again. Pt accepts. Morphine   ordered.   8:15: Care of pt passed on to Debbe Fail, NP.    Felipe Horton, [place] , CNM 12/28/2023 8:18 PM   1. Preterm contractions (Primary) - lactated ringers  bolus 1,000 mL - Wet prep, genital; Standing - GC/Chlamydia probe amp ([place])not at [facility]; Standing - Wet prep, genital - GC/Chlamydia probe amp ([place])not at [facility] - Urinalysis, Routine w reflex microscopic -Urine, Clean Catch; Standing - Urinalysis, Routine w reflex microscopic -Urine, Clean Catch - Culture, beta strep (group b only); Standing - Culture, beta strep (group b only) - morphine  (PF) 4 MG/ML injection 4 mg - ferumoxytol  (FERAHEME) 510 mg in sodium chloride  0.9 % 100 mL IVPB  2. [redacted] weeks gestation of pregnancy - lactated ringers  bolus 1,000 mL - Wet prep, genital; Standing - GC/Chlamydia probe amp ([place])not at [facility]; Standing - Wet prep, genital - GC/Chlamydia probe amp ([place])not at [facility] - Urinalysis, Routine w reflex microscopic -Urine, Clean Catch; Standing - Urinalysis, Routine w reflex microscopic -Urine, Clean Catch - Culture, beta strep (group b only); Standing - Culture, beta strep (group b only) - morphine  (PF) 4 MG/ML injection 4 mg - ferumoxytol  (FERAHEME) 510 mg in sodium chloride  0.9 % 100 mL IVPB  3. Polyhydramnios in third trimester, fetus 1 - lactated ringers  bolus 1,000 mL - Wet prep, genital; Standing - GC/Chlamydia probe amp ([place]not at [facility]; Standing - Wet prep, genital - GC/Chlamydia probe amp ([place])not at [facility] - Urinalysis, Routine w reflex microscopic -Urine, Clean Catch; Standing - Urinalysis, Routine w reflex microscopic -Urine,  Clean Catch - Culture, beta strep (group b only); Standing - Culture, beta strep (group b only) - morphine  (PF) 4 MG/ML injection 4 mg - ferumoxytol  (FERAHEME) 510 mg in sodium chloride  0.9 % 100 mL IVPB  4. Maternal iron  deficiency anemia affecting pregnancy in third trimester, antepartum - lactated ringers  bolus  1,000 mL - Wet prep, genital; Standing - GC/Chlamydia probe amp ([place])not at [facility]; Standing - Wet prep, genital - GC/Chlamydia probe amp ([place])not at [facility] - Urinalysis, Routine w reflex microscopic -Urine, Clean Catch; Standing - Urinalysis, Routine w reflex microscopic -Urine, Clean Catch - Culture, beta strep (group b only); Standing - Culture, beta strep (group b only) - morphine  (PF) 4 MG/ML injection 4 mg - ferumoxytol  (FERAHEME) 510 mg in sodium chloride  0.9 % 100 mL IVPB   Meds ordered this encounter  Medications   lactated ringers  bolus 1,000 mL   morphine  (PF) 4 MG/ML injection 4 mg   ferumoxytol  (FERAHEME) 510 mg in sodium chloride  0.9 % 100 mL IVPB   zolpidem  (AMBIEN ) 5 MG tablet    Sig: Take 1-2 tablets (5-10 mg total) by mouth at bedtime as needed for sleep.    Dispense:  20 tablet    Refill:  0    Supervising Provider:   Abigail Abler [[id number]]   promethazine  (PHENERGAN ) 25 mg in sodium chloride  0.9 % 50 mL IVPB   lactated ringers  bolus 250 mL   Ferric Maltol (ACCRUFER) 30 MG CAPS    Sig: Take 1 capsule (30 mg total) by mouth daily.    Dispense:  30 capsule    Refill:  0    Supervising Provider:   Lisette Ridgel     I received care of this patient at 2015 from Pressley Brome, CNM after IV Morphine  ordered and IV Iron  for Hgb of 7.0 on 12/24/23 - Orders were placed prior by Pressley Brome for Iron  infusion and Morphine  for pain management . CTX spaced out and patient reported feeling better and was observed sleeping comfortably  on reassessment at 2246 and therefore was stable for discharge.   - Discharged in stable condition with return precautions  - See AVS for detailed counseling, verbal and written instructions provided to the patient at the time of discharge. She verbalized understanding and agrees with the plan of care as described above.    Future Appointments  Date Time Provider Department Center  01/03/2024  8:55 AM [facility] US2 [facility]   01/08/2024 10:55 AM Tari Fare, CNM [facility]  01/10/2024  8:55 AM [facility] US2 [facility]

## 2023-12-30 LAB — CULTURE, BETA STREP (GROUP B ONLY)

## 2023-12-30 LAB — OB RESULTS CONSOLE GBS: GBS: NEGATIVE

## 2023-12-31 LAB — GC/CHLAMYDIA PROBE AMP (~~LOC~~) NOT AT ARMC
Chlamydia: NEGATIVE
Comment: NEGATIVE
Comment: NORMAL
Neisseria Gonorrhea: NEGATIVE

## 2024-01-01 ENCOUNTER — Telehealth: Payer: Self-pay | Admitting: Family Medicine

## 2024-01-01 ENCOUNTER — Telehealth: Payer: Self-pay | Admitting: *Deleted

## 2024-01-01 ENCOUNTER — Encounter: Payer: Self-pay | Admitting: Advanced Practice Midwife

## 2024-01-01 NOTE — Telephone Encounter (Signed)
 Patient needs someone to give her a call asap she says she has been having ongoing contractions for about a month. She is very upset because different doctors have been telling her different things and whenever she goes to MAU they do not do anything for her. Patient has a lot of questions that need to be answered.

## 2024-01-01 NOTE — Telephone Encounter (Signed)
 Patient called in requesting to speak with a nurse. She stated she has been waiting on nurse to call her all day. Informed patient to give nurse at least 24 hrs. I tried to find a nurse to speak with her no one was available. I informed patient that everyone was busy with patients but what is her concerns so I can send an encounter. Patient states she is having contractions for a month now, and call just dropped.

## 2024-01-01 NOTE — Telephone Encounter (Signed)
 I called [facility] as requested by Dr Daisey Dryer to follow up on cancelled IV iron  infusions. She said the hospital cancelled them because she had an infusion while at the hospital and they sent her home on oral iron  which she is taking. I informed her I would pass on to Dr. Daisey Dryer and if she still feels she needs another iron  infusion we will call her back. Alejandra Hurst

## 2024-01-02 ENCOUNTER — Telehealth: Payer: Self-pay | Admitting: Lactation Services

## 2024-01-02 ENCOUNTER — Other Ambulatory Visit (HOSPITAL_COMMUNITY): Payer: Self-pay | Admitting: Family Medicine

## 2024-01-02 DIAGNOSIS — O99019 Anemia complicating pregnancy, unspecified trimester: Secondary | ICD-10-CM

## 2024-01-02 DIAGNOSIS — O99013 Anemia complicating pregnancy, third trimester: Secondary | ICD-10-CM

## 2024-01-02 NOTE — Telephone Encounter (Signed)
-----   Message from [place]  Smith sent at 12/31/2023  4:09 PM EDT ----- Regarding: IV Iron  Pt has Hgb 7 and is 35 weeks. DNKA for IV iron  4/25 but was given 500 mg dose in MAU. Can someone facilitate getting her set up for a second dose around 5/2? Orders are already placed. She high risk for preterm delivery and PP hemorrhage due to severe Polyhydramnios.

## 2024-01-02 NOTE — Telephone Encounter (Signed)
 Addressed in another encounter

## 2024-01-02 NOTE — Telephone Encounter (Signed)
 Called and spoke with patient. Reviewed that it is recommended that she have an iron  infusion. She reports every time she gets an Iron  infusion she never makes it as she is in so much pain, reviewed how important it is to boost her Hgb for delivery.    She reports she has been having contractions for a month and she is tired of this. She reports she is not sleeping at night and is in pain when moving around. She is having pressure and cramps in the abdomen.   She has taken the Ambien  and can sleep for 5 hours and then wakes up and cannot sleep again.   She has taken the Flexeril  at night and they keep her up. These were prescribed for muscle spasms. She reports the Morphine  helped some when she went to MAU, however the contractions increased the following morning.   She is still  having contractions today and they have been as often as 2 minutes apart. She is cramping a lot today that is worse than it has been. It can last for up to 2 minutes at at time. She has a headache today and needs to keep her eyes closed. She denies blurred vision or dizziness. She denies vaginal leaking.   She is losing her mucous plug. Baby was moving earlier, she has not felt her for 2-3 hours. She has been drinking plenty of water. When she lays on her left side her legs go numb. Her pelvic area is very painful.   She did feel better when she got Morphine  in the hospital.   She reports her son is autistic and ADHD and not able to take him to his appointments. She does not have support to help her with her son. She lives with her sister and her sister helps with the older child. Sister has new twins.   Patient reports she is feeling very stressed and is in a lot of pain.   Spoke with Dr. Aquilla Knapp and he suggested she go to MAU for evaluation. Informed patient to go to MAU for evaluation. Patient and her sister were both upset that there is nothing being done. Reviewed that it would be in her best interest to go be  evaluated. Reviewed she has a BPP tomorrow morning and an iron  infusion on Friday. Gave locations and times of appointments.   Patient reports she does not wish to go to the MAU as she feels nothing is being done and she is concerned about the care of her son. Encouraged her to do so. Patient was upset throughout call.

## 2024-01-03 ENCOUNTER — Ambulatory Visit

## 2024-01-03 ENCOUNTER — Other Ambulatory Visit: Payer: Self-pay

## 2024-01-03 DIAGNOSIS — O403XX0 Polyhydramnios, third trimester, not applicable or unspecified: Secondary | ICD-10-CM | POA: Diagnosis not present

## 2024-01-03 DIAGNOSIS — Z3A35 35 weeks gestation of pregnancy: Secondary | ICD-10-CM

## 2024-01-03 DIAGNOSIS — O409XX0 Polyhydramnios, unspecified trimester, not applicable or unspecified: Secondary | ICD-10-CM

## 2024-01-04 ENCOUNTER — Inpatient Hospital Stay (HOSPITAL_COMMUNITY): Admission: RE | Admit: 2024-01-04 | Source: Ambulatory Visit

## 2024-01-04 ENCOUNTER — Encounter (HOSPITAL_COMMUNITY)

## 2024-01-04 ENCOUNTER — Telehealth: Payer: Self-pay

## 2024-01-04 ENCOUNTER — Other Ambulatory Visit: Payer: Self-pay | Admitting: Family Medicine

## 2024-01-04 ENCOUNTER — Telehealth: Payer: Self-pay | Admitting: *Deleted

## 2024-01-04 NOTE — Telephone Encounter (Signed)
 Dr. Ilona Malta, patient will be scheduled as soon as possible.  Auth Submission: NO AUTH NEEDED Site of care: Site of care: [facility] Payer: [place] [employer] Medication & CPT/J Code(s) submitted: Venofer (Iron  Sucrose) J1756 Route of submission (phone, fax, portal):  Phone # Fax # Auth type: Buy/Bill PB Units/visits requested: 500mg  x 1 dose Reference number:  Approval from: 01/04/24 to 04/05/24

## 2024-01-04 NOTE — Telephone Encounter (Signed)
 Patient called front desk reporting she can't go to iron  infusions because can't go early and call transferred to nurse. I spoke with Angus Kenning and she says she can't go to early appointments because her transportation is her sister and she has 5 kids that she has to get to school and has twins, then watches Michaelle's child when she has appointments States her sister has to take older kids to school so there is no way she can have appointment before 10. We called short stay to reschedule but were informed Iron  infusions have to be at 8am. I informed Alexiz we will see what other options we have for IV iron  because she will need more than oral meds because her hemoglobin is 7.  I called [facility] and they can informed me they can do different times but needs orders placed and routed to them. I discussed with Dr. Ilona Malta and he placed orders and routed to them. I called [facility] and informed them of patient name and that orders were sent and needs to be scheduled asap and that she has transportation issues. They informed me they will call next week once pharmacy has verified insurance. I called [facility] and informed her of plan. Carmeline informed me MFM also advised she be be delivered  at 37 weeks. I explained provider will discuss with her and schedule at the ob fu on 5/ 6 and that it is important she attend that appointment and if she cannot to call us  to discuss IOL. She voices understanding. Alejandra Hurst

## 2024-01-04 NOTE — Progress Notes (Signed)
Infusion center orders °

## 2024-01-05 ENCOUNTER — Encounter (HOSPITAL_COMMUNITY): Payer: Self-pay | Admitting: Obstetrics & Gynecology

## 2024-01-05 ENCOUNTER — Inpatient Hospital Stay (HOSPITAL_COMMUNITY)

## 2024-01-05 ENCOUNTER — Inpatient Hospital Stay (HOSPITAL_COMMUNITY)
Admission: AD | Admit: 2024-01-05 | Discharge: 2024-01-05 | Disposition: A | Discharge status: Home / Self Care | Payer: Self-pay | Attending: Obstetrics & Gynecology | Admitting: Obstetrics & Gynecology

## 2024-01-05 DIAGNOSIS — O4703 False labor before 37 completed weeks of gestation, third trimester: Secondary | ICD-10-CM | POA: Diagnosis present

## 2024-01-05 DIAGNOSIS — Z3A36 36 weeks gestation of pregnancy: Secondary | ICD-10-CM | POA: Diagnosis not present

## 2024-01-05 DIAGNOSIS — O99013 Anemia complicating pregnancy, third trimester: Secondary | ICD-10-CM

## 2024-01-05 DIAGNOSIS — O403XX0 Polyhydramnios, third trimester, not applicable or unspecified: Secondary | ICD-10-CM | POA: Diagnosis not present

## 2024-01-05 DIAGNOSIS — D259 Leiomyoma of uterus, unspecified: Secondary | ICD-10-CM | POA: Insufficient documentation

## 2024-01-05 DIAGNOSIS — O289 Unspecified abnormal findings on antenatal screening of mother: Secondary | ICD-10-CM | POA: Diagnosis not present

## 2024-01-05 DIAGNOSIS — O4700 False labor before 37 completed weeks of gestation, unspecified trimester: Secondary | ICD-10-CM | POA: Diagnosis not present

## 2024-01-05 DIAGNOSIS — D649 Anemia, unspecified: Secondary | ICD-10-CM | POA: Insufficient documentation

## 2024-01-05 DIAGNOSIS — I10 Essential (primary) hypertension: Secondary | ICD-10-CM | POA: Diagnosis not present

## 2024-01-05 DIAGNOSIS — O3413 Maternal care for benign tumor of corpus uteri, third trimester: Secondary | ICD-10-CM | POA: Insufficient documentation

## 2024-01-05 DIAGNOSIS — R58 Hemorrhage, not elsewhere classified: Secondary | ICD-10-CM | POA: Diagnosis not present

## 2024-01-05 DIAGNOSIS — R1084 Generalized abdominal pain: Secondary | ICD-10-CM | POA: Diagnosis not present

## 2024-01-05 MED ORDER — OXYCODONE-ACETAMINOPHEN 5-325 MG PO TABS
2.0000 | ORAL_TABLET | Freq: Once | ORAL | Status: AC
  Administered 2024-01-05: 2 via ORAL
  Filled 2024-01-05: qty 2

## 2024-01-05 MED ORDER — ZOLPIDEM TARTRATE 5 MG PO TABS: 5.0000 mg | ORAL_TABLET | Freq: Every evening | ORAL | Status: DC | PRN

## 2024-01-05 NOTE — MAU Note (Addendum)
 Sharon Barnett is a 33 y.o. at [redacted]w[redacted]d here in MAU reporting: came in by EMS for ctxs.  Was at her nieces track meet when they started around 1100,  seem to "back to back".  Denies bleeding or leaking, baby is moving.  Unsure of last exam. Onset of complaint: 1100 Pain score: 10 Vitals:   01/05/24 1241  BP: 122/74  Pulse: 67  Resp: 18  Temp: 98.4 F (36.9 C)  SpO2: 100%     FHT:132 Lab orders placed from triage:     Saline lock started in left [facility] by EMS.

## 2024-01-07 ENCOUNTER — Encounter (HOSPITAL_COMMUNITY): Payer: Self-pay | Admitting: Obstetrics and Gynecology

## 2024-01-07 ENCOUNTER — Observation Stay (HOSPITAL_COMMUNITY)
Admission: AD | Admit: 2024-01-07 | Discharge: 2024-01-08 | Disposition: A | Discharge status: Home / Self Care | Attending: Family Medicine | Admitting: Family Medicine

## 2024-01-07 DIAGNOSIS — O34593 Maternal care for other abnormalities of gravid uterus, third trimester: Secondary | ICD-10-CM | POA: Diagnosis not present

## 2024-01-07 DIAGNOSIS — D649 Anemia, unspecified: Secondary | ICD-10-CM | POA: Diagnosis not present

## 2024-01-07 DIAGNOSIS — E049 Nontoxic goiter, unspecified: Secondary | ICD-10-CM | POA: Insufficient documentation

## 2024-01-07 DIAGNOSIS — O99283 Endocrine, nutritional and metabolic diseases complicating pregnancy, third trimester: Secondary | ICD-10-CM | POA: Diagnosis not present

## 2024-01-07 DIAGNOSIS — O403XX0 Polyhydramnios, third trimester, not applicable or unspecified: Secondary | ICD-10-CM | POA: Diagnosis not present

## 2024-01-07 DIAGNOSIS — Z3A36 36 weeks gestation of pregnancy: Secondary | ICD-10-CM | POA: Insufficient documentation

## 2024-01-07 DIAGNOSIS — O99013 Anemia complicating pregnancy, third trimester: Secondary | ICD-10-CM | POA: Insufficient documentation

## 2024-01-07 DIAGNOSIS — R109 Unspecified abdominal pain: Secondary | ICD-10-CM | POA: Diagnosis not present

## 2024-01-07 DIAGNOSIS — O3412 Maternal care for benign tumor of corpus uteri, second trimester: Secondary | ICD-10-CM | POA: Diagnosis not present

## 2024-01-07 LAB — URINALYSIS, ROUTINE W REFLEX MICROSCOPIC
Bilirubin Urine: NEGATIVE
Glucose, UA: NEGATIVE mg/dL
Hgb urine dipstick: NEGATIVE
Ketones, ur: NEGATIVE mg/dL
Nitrite: NEGATIVE
Protein, ur: 30 mg/dL — AB
Specific Gravity, Urine: 1.025 (ref 1.005–1.030)
pH: 5 (ref 5.0–8.0)

## 2024-01-07 NOTE — MAU Note (Signed)
..  Sharon Barnett is a 33 y.o. at [redacted]w[redacted]d here in MAU reporting: N/V has vomited more than 10 times. Can not keep anything.  Reports diarrhea all day. Is starting to have some abdominal cramping.  Has attempted to take nausea medication but can not keep it down.  +FM but not like she normally moves. Denies vaginal bleeding or leaking of fluid.   Onset of complaint: last night Pain score: 8/10 Vitals:   01/07/24 2309  BP: 125/76  Pulse: (!) 58  Resp: 18  Temp: 98 F (36.7 C)  SpO2: 100%     FHT:130 Lab orders placed from triage:  UA

## 2024-01-08 ENCOUNTER — Inpatient Hospital Stay (HOSPITAL_COMMUNITY)

## 2024-01-08 ENCOUNTER — Encounter: Admitting: Certified Nurse Midwife

## 2024-01-08 ENCOUNTER — Other Ambulatory Visit: Payer: Self-pay

## 2024-01-08 DIAGNOSIS — O99013 Anemia complicating pregnancy, third trimester: Secondary | ICD-10-CM | POA: Diagnosis not present

## 2024-01-08 DIAGNOSIS — O403XX0 Polyhydramnios, third trimester, not applicable or unspecified: Secondary | ICD-10-CM

## 2024-01-08 DIAGNOSIS — O3413 Maternal care for benign tumor of corpus uteri, third trimester: Secondary | ICD-10-CM | POA: Diagnosis not present

## 2024-01-08 DIAGNOSIS — D259 Leiomyoma of uterus, unspecified: Secondary | ICD-10-CM

## 2024-01-08 DIAGNOSIS — D649 Anemia, unspecified: Secondary | ICD-10-CM

## 2024-01-08 DIAGNOSIS — Z3A36 36 weeks gestation of pregnancy: Secondary | ICD-10-CM | POA: Diagnosis not present

## 2024-01-08 DIAGNOSIS — O0993 Supervision of high risk pregnancy, unspecified, third trimester: Secondary | ICD-10-CM

## 2024-01-08 DIAGNOSIS — R109 Unspecified abdominal pain: Secondary | ICD-10-CM | POA: Diagnosis not present

## 2024-01-08 LAB — RPR: RPR Ser Ql: NONREACTIVE

## 2024-01-08 LAB — GC/CHLAMYDIA PROBE AMP (~~LOC~~) NOT AT ARMC
Chlamydia: NEGATIVE
Comment: NEGATIVE
Comment: NORMAL
Neisseria Gonorrhea: NEGATIVE

## 2024-01-08 LAB — WET PREP, GENITAL
Clue Cells Wet Prep HPF POC: NONE SEEN
Sperm: NONE SEEN
Trich, Wet Prep: NONE SEEN
WBC, Wet Prep HPF POC: <10 (ref <10)
Yeast Wet Prep HPF POC: NONE SEEN

## 2024-01-08 LAB — TSH: TSH: 2.87 u[IU]/mL (ref 0.350–4.500)

## 2024-01-08 LAB — CBC
HCT: 27.6 % — ABNORMAL LOW (ref 36.0–46.0)
Hemoglobin: 8.2 g/dL — ABNORMAL LOW (ref 12.0–15.0)
MCH: 26.1 pg (ref 26.0–34.0)
MCHC: 29.7 g/dL — ABNORMAL LOW (ref 30.0–36.0)
MCV: 87.9 fL (ref 80.0–100.0)
Platelets: 243 10*3/uL (ref 150–400)
RBC: 3.14 MIL/uL — ABNORMAL LOW (ref 3.87–5.11)
RDW: 21.8 % — ABNORMAL HIGH (ref 11.5–15.5)
WBC: 8.5 10*3/uL (ref 4.0–10.5)
nRBC: 0.5 % — ABNORMAL HIGH (ref 0.0–0.2)

## 2024-01-08 LAB — T4, FREE: Free T4: 0.95 ng/dL (ref 0.61–1.12)

## 2024-01-08 LAB — PREPARE RBC (CROSSMATCH)

## 2024-01-08 MED ORDER — ONDANSETRON HCL 4 MG/2ML IJ SOLN
4.0000 mg | Freq: Once | INTRAMUSCULAR | Status: AC
  Administered 2024-01-08: 4 mg via INTRAVENOUS
  Filled 2024-01-08: qty 2

## 2024-01-08 MED ORDER — LIDOCAINE HCL (PF) 1 % IJ SOLN: 30.0000 mL | INTRAMUSCULAR | Status: DC | PRN

## 2024-01-08 MED ORDER — LACTATED RINGERS IV BOLUS
1000.0000 mL | Freq: Once | INTRAVENOUS | Status: AC
  Administered 2024-01-08: 1000 mL via INTRAVENOUS

## 2024-01-08 MED ORDER — SODIUM CHLORIDE 0.9% IV SOLUTION: Freq: Once | INTRAVENOUS | Status: DC

## 2024-01-08 MED ORDER — OXYTOCIN-SODIUM CHLORIDE 30-0.9 UT/500ML-% IV SOLN: 1.0000 m[IU]/min | INTRAVENOUS | Status: DC

## 2024-01-08 MED ORDER — SOD CITRATE-CITRIC ACID 500-334 MG/5ML PO SOLN: 30.0000 mL | ORAL | Status: DC | PRN

## 2024-01-08 MED ORDER — OXYTOCIN BOLUS FROM INFUSION: 333.0000 mL | Freq: Once | INTRAVENOUS | Status: DC

## 2024-01-08 MED ORDER — LACTATED RINGERS IV SOLN
500.0000 mL | INTRAVENOUS | Status: DC | PRN
  Administered 2024-01-08: 1000 mL via INTRAVENOUS
  Administered 2024-01-08: 500 mL via INTRAVENOUS

## 2024-01-08 MED ORDER — LACTATED RINGERS IV SOLN: INTRAVENOUS | Status: DC

## 2024-01-08 MED ORDER — TERBUTALINE SULFATE 1 MG/ML IJ SOLN: 0.2500 mg | Freq: Once | INTRAMUSCULAR | Status: DC | PRN

## 2024-01-08 MED ORDER — NIFEDIPINE 10 MG PO CAPS: 10.0000 mg | ORAL_CAPSULE | Freq: Three times a day (TID) | ORAL | 0 refills | Status: DC

## 2024-01-08 MED ORDER — OXYTOCIN-SODIUM CHLORIDE 30-0.9 UT/500ML-% IV SOLN: 2.5000 [IU]/h | INTRAVENOUS | Status: DC

## 2024-01-08 MED ORDER — TRANEXAMIC ACID-NACL 1000-0.7 MG/100ML-% IV SOLN: 1000.0000 mg | Freq: Once | INTRAVENOUS | Status: DC | PRN

## 2024-01-08 MED ORDER — TERBUTALINE SULFATE 1 MG/ML IJ SOLN: 0.2500 mg | Freq: Once | INTRAMUSCULAR | Status: DC

## 2024-01-08 MED ORDER — FENTANYL CITRATE (PF) 100 MCG/2ML IJ SOLN
50.0000 ug | INTRAMUSCULAR | Status: DC | PRN
  Administered 2024-01-08 (×2): 100 ug via INTRAVENOUS
  Filled 2024-01-08 (×2): qty 2

## 2024-01-08 MED ORDER — ONDANSETRON HCL 4 MG/2ML IJ SOLN: 4.0000 mg | Freq: Four times a day (QID) | INTRAMUSCULAR | Status: DC | PRN

## 2024-01-08 MED ORDER — ACETAMINOPHEN 325 MG PO TABS: 650.0000 mg | ORAL_TABLET | ORAL | Status: DC | PRN

## 2024-01-08 MED ORDER — OXYCODONE-ACETAMINOPHEN 5-325 MG PO TABS: 1.0000 | ORAL_TABLET | ORAL | Status: DC | PRN

## 2024-01-08 MED ORDER — OXYCODONE-ACETAMINOPHEN 5-325 MG PO TABS: 2.0000 | ORAL_TABLET | ORAL | Status: DC | PRN

## 2024-01-08 NOTE — Discharge Summary (Signed)
 Discharge Summary  Date of Service updated 01/08/2024     Patient Name: Sharon Barnett DOB: 29-Dec-1990 MRN: [id number]  Date of admission: 01/07/2024 Date of discharge: 01/08/2024  Admitting diagnosis: Preterm labor [O60.00] Intrauterine pregnancy: [redacted]w[redacted]d     Secondary diagnosis:  Principal Problem:   Preterm labor  Additional problems: Polyhydramnios    Discharge diagnosis:  False Labor                                                Hospital course:  Admitted for concern for preterm labor. Minimal to no cervical change overnight. Limited US  reassuring on day of discharge. Reactive NST.   Magnesium Sulfate received: No BMZ received: No Rhophylac:N/A MMR:Non-immune T-DaP:Declined Flu: Declined RSV Vaccine received: No Transfusion:No  Immunizations received:  There is no immunization history on file for this patient.  Physical exam  Vitals:   01/08/24 0901 01/08/24 1041 01/08/24 1145 01/08/24 1325  BP: 116/70 131/84 117/75 110/89  Pulse: (!) 59 62 66 77  Resp:    17  Temp:    97.9 F (36.6 C)  TempSrc:    Oral  SpO2:    99%  Weight:      Height:       General: alert, cooperative, and no distress Lochia: appropriate Uterine Fundus: Gravid Incision: N/A DVT Evaluation: No evidence of DVT seen on physical exam. Negative Homan's sign. No cords or calf tenderness. Labs: Lab Results  Component Value Date   WBC 8.5 01/08/2024   HGB 8.2 (L) 01/08/2024   HCT 27.6 (L) 01/08/2024   MCV 87.9 01/08/2024   PLT 243 01/08/2024      Latest Ref Rng & Units 12/24/2023   12:25 PM  CMP  Glucose 70 - 99 mg/dL 71   BUN 6 - 20 mg/dL 9   Creatinine 2.13 - 0.86 mg/dL 5.78   Sodium 469 - 629 mmol/L 136   Potassium 3.5 - 5.1 mmol/L 3.4   Chloride 98 - 111 mmol/L 106   CO2 22 - 32 mmol/L 19   Calcium  8.9 - 10.3 mg/dL 8.5   Total Protein 6.5 - 8.1 g/dL 6.0   Total Bilirubin 0.0 - 1.2 mg/dL 0.8   Alkaline Phos 38 - 126 U/L 97   AST 15 - 41 U/L 15   ALT 0 - 44 U/L 8     Edinburgh Score:     No data to display         No data recorded  After visit meds:  Allergies as of 01/08/2024   No Known Allergies      Medication List     TAKE these medications    ACCRUFeR  30 MG Caps Generic drug: Ferric Maltol  Take 1 capsule (30 mg total) by mouth daily.   aspirin  EC 81 MG tablet Take 1 tablet (81 mg total) by mouth daily.   cyclobenzaprine  10 MG tablet Commonly known as: FLEXERIL  Take 1 tablet (10 mg total) by mouth 3 (three) times daily as needed for muscle spasms.   diphenhydrAMINE  25 mg capsule Commonly known as: BENADRYL  Take 1 capsule (25 mg total) by mouth every 6 (six) hours as needed (headache).   famotidine  20 MG tablet Commonly known as: Pepcid  Take 1 tablet (20 mg total) by mouth 2 (two) times daily.   NIFEdipine  10 MG capsule Commonly known as: PROCARDIA   Take 1 capsule (10 mg total) by mouth 3 (three) times daily.   zolpidem  5 MG tablet Commonly known as: AMBIEN  Take 1 tablet (5 mg total) by mouth at bedtime as needed for sleep.         Discharge home in stable condition  Discharge instruction: Labor and return precautions Activity: Advance as tolerated.  Diet: routine diet Future Appointments: Future Appointments  Date Time Provider Department Center  01/08/2024  2:15 PM Donnis Galeazzi [facility]  01/10/2024  8:55 AM [facility] US2 [facility]  01/12/2024 12:00 AM [facility] SCHED ROOM [facility] None   Follow up Visit:  Follow-up Information     [facility] for [facility]. Go to.   Specialty: Obstetrics and Gynecology Why: Continue as scheduled prenatal care Contact information: [address] [place]  [id number] [phone]                01/08/2024 Darrow End, MD

## 2024-01-08 NOTE — MAU Provider Note (Addendum)
 History     CSN: [id number]  Arrival date and time: 01/07/24 2254   Event Date/Time   First Provider Initiated Contact with Patient 01/08/24 0120      Chief Complaint  Patient presents with   Nausea   Emesis   Diarrhea   Emesis  Associated symptoms include diarrhea. Pertinent negatives include no abdominal pain, chest pain, chills, coughing, dizziness or fever.  Diarrhea  Associated symptoms include vomiting. Pertinent negatives include no abdominal pain, chills, coughing or fever.    33 y.o. [id number] [redacted]w[redacted]d here with complaints of   - nausea and vomiting, reports 10 times in the past day - Reports also diarrhea that is new, >5 episodes in the past 24 hr - Also notes severe HA  - had not been able to keep down water or food for the whole day - Reports vaginal irritation for the past day - Reports cramping today - Reports pain in her right side, only improved by laying on her right - Denies fever, chills, Denies dysuria  +FM, denies LOF, VB, vaginal discharge but does have her mucous plug  Pregnancy complicated by - Anemia (7.0) - severe polyhydramnios  OB History     Gravida  3   Para  1   Term  1   Preterm  0   AB  1   Living  1      SAB  0   IAB  1   Ectopic  0   Multiple  0   Live Births  1           Past Medical History:  Diagnosis Date   ADHD (attention deficit hyperactivity disorder)    Anemia    Complication of anesthesia    Hypersensitive to anesthesia (wisdom teeth)   Fibroid    Headache    Hyperemesis gravidarum    UTI (urinary tract infection)     Past Surgical History:  Procedure Laterality Date   OTHER SURGICAL HISTORY     elective abortion   WISDOM TOOTH EXTRACTION      Family History  Problem Relation Age of Onset   Depression Mother    Diabetes Mother    Hypertension Mother    Fibroids Mother    Bipolar disorder Mother    Sarcoidosis Father    Asthma Sister    Depression Sister    Hearing loss Sister     Hyperlipidemia Sister    Hypertension Sister    Miscarriages / Stillbirths Sister    Hearing loss Maternal Grandmother    Diabetes Maternal Grandmother    Hypertension Maternal Grandmother    Stroke Maternal Grandmother    Brain cancer Maternal Grandmother    Hodgkin's lymphoma Niece     Social History   Tobacco Use   Smoking status: Never   Smokeless tobacco: Never  Vaping Use   Vaping status: Never Used  Substance Use Topics   Alcohol use: No   Drug use: Not Currently    Types: Marijuana    Comment: not since +preg    Allergies: No Known Allergies  Medications Prior to Admission  Medication Sig Dispense Refill Last Dose/Taking   aspirin  EC 81 MG tablet Take 1 tablet (81 mg total) by mouth daily. 90 tablet 3    cyclobenzaprine  (FLEXERIL ) 10 MG tablet Take 1 tablet (10 mg total) by mouth 3 (three) times daily as needed for muscle spasms. 20 tablet 0    diphenhydrAMINE  (BENADRYL ) 25 mg capsule Take 1 capsule (25  mg total) by mouth every 6 (six) hours as needed (headache). (Patient not taking: Reported on 09/14/2023) 30 capsule 2    famotidine  (PEPCID ) 20 MG tablet Take 1 tablet (20 mg total) by mouth 2 (two) times daily. 30 tablet 0    Ferric Maltol  (ACCRUFER ) 30 MG CAPS Take 1 capsule (30 mg total) by mouth daily. 30 capsule 0    zolpidem  (AMBIEN ) 5 MG tablet Take 1 tablet (5 mg total) by mouth at bedtime as needed for sleep.       Review of Systems  Constitutional:  Negative for chills and fever.  HENT:  Negative for congestion and sore throat.   Eyes:  Negative for pain and visual disturbance.  Respiratory:  Negative for cough, chest tightness and shortness of breath.   Cardiovascular:  Negative for chest pain.  Gastrointestinal:  Positive for diarrhea and vomiting. Negative for abdominal pain and nausea.  Endocrine: Negative for cold intolerance and heat intolerance.  Genitourinary:  Negative for dysuria and flank pain.  Musculoskeletal:  Negative for back pain.   Skin:  Negative for rash.  Allergic/Immunologic: Negative for food allergies.  Neurological:  Negative for dizziness and light-headedness.  Psychiatric/Behavioral:  Negative for agitation.    Physical Exam   Blood pressure 125/76, pulse (!) 58, temperature 98 F (36.7 C), temperature source Oral, resp. rate 18, height 5\' 9"  (1.753 m), weight 81 kg, last menstrual period 04/29/2023, SpO2 100%, unknown if currently breastfeeding.  Physical Exam Vitals and nursing note reviewed.  Constitutional:      General: She is not in acute distress.    Appearance: She is well-developed.     Comments: Pregnant female  HENT:     Head: Normocephalic and atraumatic.  Eyes:     General: No scleral icterus.    Conjunctiva/sclera: Conjunctivae normal.  Cardiovascular:     Rate and Rhythm: Normal rate.  Pulmonary:     Effort: Pulmonary effort is normal.  Chest:     Chest wall: No tenderness.  Abdominal:     Palpations: Abdomen is soft.     Tenderness: There is no abdominal tenderness. There is no guarding or rebound.     Comments: Gravid  Genitourinary:    Vagina: Normal.  Musculoskeletal:        General: Normal range of motion.     Cervical back: Normal range of motion and neck supple.  Skin:    General: Skin is warm and dry.     Findings: No rash.  Neurological:     Mental Status: She is alert and oriented to person, place, and time.    Initial cervical exam Dilation: 4.5 Exam by:: Daisey Dryer, MD  Repeat exam Dilation: 4.5 Effacement (%): 70 Cervical Position: Middle Station: -3 Presentation: Vertex Exam by:: Daisey Dryer, MD   MAU Course  Procedures NST 130/moderate/+accels, no decels Toco: q 1-2 minutes, irritable  MDM - moderate  Work up - labs - WNL - Renal US  was normal - antiemetics  2:15 AM recommended terb. Discussed reason for terb with q1 min contractions. Reviewed se of medication. Patient had her sister on the phone. They both declined the terbutaline  because it  "did not stop her contractions before." I reviewed this is a different situation and I do not thing they will stop her ctx but rather space them out. Discussed also that in labor the team might recommend the terbutaline  due to fetal intolerance or her baby's heart rate decreasing. Currently 120s/moderate/no accels, no decels. Cat 1  Assessment and Plan   Admit to LD  Abner Ables 01/08/2024, 1:20 AM

## 2024-01-08 NOTE — Plan of Care (Signed)
   Problem: Education: Goal: Knowledge of General Education information will improve Description: Including pain rating scale, medication(s)/side effects and non-pharmacologic comfort measures Outcome: Progressing   Problem: Health Behavior/Discharge Planning: Goal: Ability to manage health-related needs will improve Outcome: Progressing   Problem: Clinical Measurements: Goal: Ability to maintain clinical measurements within normal limits will improve Outcome: Progressing Goal: Will remain free from infection Outcome: Progressing Goal: Diagnostic test results will improve Outcome: Progressing Goal: Respiratory complications will improve Outcome: Progressing Goal: Cardiovascular complication will be avoided Outcome: Progressing   Problem: Activity: Goal: Risk for activity intolerance will decrease Outcome: Progressing   Problem: Nutrition: Goal: Adequate nutrition will be maintained Outcome: Progressing   Problem: Coping: Goal: Level of anxiety will decrease Outcome: Progressing   Problem: Elimination: Goal: Will not experience complications related to bowel motility Outcome: Progressing Goal: Will not experience complications related to urinary retention Outcome: Progressing   Problem: Pain Managment: Goal: General experience of comfort will improve and/or be controlled Outcome: Progressing   Problem: Safety: Goal: Ability to remain free from injury will improve Outcome: Progressing   Problem: Skin Integrity: Goal: Risk for impaired skin integrity will decrease Outcome: Progressing   Problem: Education: Goal: Knowledge of Childbirth will improve Outcome: Progressing Goal: Ability to make informed decisions regarding treatment and plan of care will improve Outcome: Progressing Goal: Ability to state and carry out methods to decrease the pain will improve Outcome: Progressing Goal: Individualized Educational Video(s) Outcome: Progressing   Problem:  Coping: Goal: Ability to verbalize concerns and feelings about labor and delivery will improve Outcome: Progressing   Problem: Life Cycle: Goal: Ability to make normal progression through stages of labor will improve Outcome: Progressing Goal: Ability to effectively push during vaginal delivery will improve Outcome: Progressing   Problem: Role Relationship: Goal: Will demonstrate positive interactions with the child Outcome: Progressing   Problem: Safety: Goal: Risk of complications during labor and delivery will decrease Outcome: Progressing   Problem: Pain Management: Goal: Relief or control of pain from uterine contractions will improve Outcome: Progressing

## 2024-01-08 NOTE — H&P (Signed)
 OBSTETRIC ADMISSION HISTORY AND PHYSICAL  MAUDENA BONTEMPS is a 33 y.o. female G3P1011 with IUP at [redacted]w[redacted]d by US  presenting for PTL. She reports +FMs, No LOF, no VB, no blurry vision, headaches or peripheral edema, and RUQ pain.  She plans on both breast and bottle feeding. She's unsure for birth control. She received her prenatal care at  [facility]    Dating: By US  --->  Estimated Date of Delivery: 02/01/24  Sono:    @[redacted]w[redacted]d , CWD, normal anatomy, cephalic presentation, right lateral placental lie, 2253g, 46% EFW, AFI 33.63 (LVP 12.8)   Prenatal History/Complications:  Patient Active Problem List   Diagnosis Date Noted   Polyhydramnios affecting pregnancy in third trimester 12/26/2023   Enlarged thyroid  12/11/2023   Anemia affecting pregnancy in third trimester 12/10/2023   B12 deficiency 12/10/2023   Anemia during pregnancy 12/03/2023   Hyperemesis gravidarum 10/17/2023   Fibroid uterus 09/17/2023   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   HGSIL (high grade squamous intraepithelial lesion) on Pap smear of cervix 08/07/2023   Rubella non-immune status, antepartum 08/01/2023   Supervision of high risk pregnancy, antepartum 07/30/2023   Migraine 05/08/2023   NURSING  PROVIDER  Office Location [facility] Dating by US  @ 7w  [facility] Model Centering Anatomy U/S WNL, uterine synechiae noted, myoma  Initiated care at  [employer]  English               LAB RESULTS   Support Person Craig Dixie (sister) Genetics NIPS: LR female AFP: DNKA      NT/IT (FT only)        Carrier Screen Horizon: Neg x 4  Rhogam  A/Positive/-- (11/25 1119) A1C/GTT Early: 4.7 Third trimester: 91/129/85  Flu Vaccine Declined 07/30/23      TDaP Vaccine   Blood Type A/Positive/-- (11/25 1119)  Covid Vaccine   Antibody Negative (11/25 1119)  RSV Vaccine   Rubella <0.90 (11/25 1119)  Feeding Plan both RPR Non Reactive (11/25 1119)  Contraception Yes- considering BTL HBsAg Negative (11/25 1119)  Circumcision   HIV  Non Reactive (11/25 1119)  Pediatrician    HCVAb Non Reactive (11/25 1119)  Prenatal Classes        BTL Consent ? Pap       Diagnosis  Date Value Ref Range Status  07/30/2023 (A)   Final    - High grade squamous intraepithelial lesion (HSIL)  [ ] Colpo  BTL Pre-payment   GC/CT Initial:   36wks:    VBAC Consent NA GBS   Neg For PCN allergy, check sensitivities            DME Rx [x]  BP cuff [x]  Weight Scale Waterbirth  [ ]  Class [ ]  Consent [ ]  CNM visit  PHQ9 & GAD7 [x]  new OB [  ] 28 weeks  [  ] 36 weeks Induction  [ ]  Orders Entered [ ] Foley Y/N     Past Medical History: Past Medical History:  Diagnosis Date   ADHD (attention deficit hyperactivity disorder)    Anemia    Complication of anesthesia    Hypersensitive to anesthesia (wisdom teeth)   Fibroid    Headache    Hyperemesis gravidarum    UTI (urinary tract infection)     Past Surgical History: Past Surgical History:  Procedure Laterality Date   OTHER SURGICAL HISTORY     elective abortion   WISDOM TOOTH EXTRACTION  Obstetrical History: OB History     Gravida  3   Para  1   Term  1   Preterm  0   AB  1   Living  1      SAB  0   IAB  1   Ectopic  0   Multiple  0   Live Births  1           Social History Social History   Socioeconomic History   Marital status: Single    Spouse name: Not on file   Number of children: Not on file   Years of education: Not on file   Highest education level: GED or equivalent  Occupational History   Occupation: [employer]  Tobacco Use   Smoking status: Never   Smokeless tobacco: Never  Vaping Use   Vaping status: Never Used  Substance and Sexual Activity   Alcohol use: No   Drug use: Not Currently    Types: Marijuana    Comment: not since +preg   Sexual activity: Not Currently    Birth control/protection: None  Other Topics Concern   Not on file  Social History Narrative   Not on file   Social Drivers of Health   Financial  Resource Strain: Low Risk  (05/29/2023)   Overall Financial Resource Strain (CARDIA)    Difficulty of Paying Living Expenses: Not hard at all  Food Insecurity: No Food Insecurity (09/10/2023)   Hunger Vital Sign    Worried About Running Out of Food in the Last Year: Never true    Ran Out of Food in the Last Year: Never true  Transportation Needs: No Transportation Needs (09/10/2023)   PRAPARE - Administrator, Civil Service (Medical): No    Lack of Transportation (Non-Medical): No  Recent Concern: Transportation Needs - Unmet Transportation Needs (07/30/2023)   PRAPARE - Transportation    Lack of Transportation (Medical): Yes    Lack of Transportation (Non-Medical): Yes  Physical Activity: Unknown (05/29/2023)   Exercise Vital Sign    Days of Exercise per Week: 0 days    Minutes of Exercise per Session: Not on file  Stress: No Stress Concern Present (05/29/2023)   [employer] of Occupational Health - Occupational Stress Questionnaire    Feeling of Stress : Only a little  Social Connections: Socially Isolated (09/06/2023)   Social Connection and Isolation Panel [NHANES]    Frequency of Communication with Friends and Family: Never    Frequency of Social Gatherings with Friends and Family: Never    Attends Religious Services: Never    Diplomatic Services operational officer: No    Attends Engineer, structural: Not on file    Marital Status: Never married    Family History: Family History  Problem Relation Age of Onset   Depression Mother    Diabetes Mother    Hypertension Mother    Fibroids Mother    Bipolar disorder Mother    Sarcoidosis Father    Asthma Sister    Depression Sister    Hearing loss Sister    Hyperlipidemia Sister    Hypertension Sister    Miscarriages / Stillbirths Sister    Hearing loss Maternal Grandmother    Diabetes Maternal Grandmother    Hypertension Maternal Grandmother    Stroke Maternal Grandmother    Brain cancer Maternal  Grandmother    Hodgkin's lymphoma Niece     Allergies: No Known Allergies  Medications Prior to Admission  Medication Sig Dispense Refill Last Dose/Taking   aspirin  EC 81 MG tablet Take 1 tablet (81 mg total) by mouth daily. 90 tablet 3    cyclobenzaprine  (FLEXERIL ) 10 MG tablet Take 1 tablet (10 mg total) by mouth 3 (three) times daily as needed for muscle spasms. 20 tablet 0    diphenhydrAMINE  (BENADRYL ) 25 mg capsule Take 1 capsule (25 mg total) by mouth every 6 (six) hours as needed (headache). (Patient not taking: Reported on 09/14/2023) 30 capsule 2    famotidine  (PEPCID ) 20 MG tablet Take 1 tablet (20 mg total) by mouth 2 (two) times daily. 30 tablet 0    Ferric Maltol  (ACCRUFER ) 30 MG CAPS Take 1 capsule (30 mg total) by mouth daily. 30 capsule 0    zolpidem  (AMBIEN ) 5 MG tablet Take 1 tablet (5 mg total) by mouth at bedtime as needed for sleep.        Review of Systems   All systems reviewed and negative except as stated in HPI  Blood pressure 125/76, pulse (!) 58, temperature 98 F (36.7 C), temperature source Oral, resp. rate 18, height 5\' 9"  (1.753 m), weight 81 kg, last menstrual period 04/29/2023, SpO2 100%, unknown if currently breastfeeding. General appearance: alert, cooperative, and appears stated age Lungs: clear to auscultation bilaterally Heart: regular rate and rhythm Abdomen: soft, non-tender; bowel sounds normal Pelvic: normal female genitalia  Extremities: Homans sign is negative, no sign of DVT Presentation: cephalic Fetal monitoringBaseline: 120 bpm, Variability: Good {> 6 bpm), Accelerations: Reactive, and Decelerations: Absent Uterine activity q2-34mins Dilation: 4.5 Effacement (%): 50 Station: -3 Exam by:: Daisey Dryer, MD   Prenatal labs: ABO, Rh: --/--/A POS (05/06 0103) Antibody: NEG (05/06 0103) Rubella: <0.90 (11/25 1119) RPR: NON REACTIVE (04/21 1225)  HBsAg: Negative (11/25 1119)  HIV: Non Reactive (03/21 0856)  GBS:     No results found for:  "GBS" GTT nrl Genetic screening  LR female, Horizon Neg x4 Anatomy US  wnl, uterine synechiae noted, myoma    There is no immunization history on file for this patient.  Prenatal Transfer Tool  Maternal Diabetes: No Genetic Screening: Normal Maternal Ultrasounds/Referrals: Other: uterine synechiae noted, myoma, Severe Poly  Fetal Ultrasounds or other Referrals:  Referred to Materal Fetal Medicine  Maternal Substance Abuse:  No Significant Maternal Medications:  None Significant Maternal Lab Results: Group B Strep negative Number of Prenatal Visits:greater than 3 verified prenatal visits Maternal Vaccinations:declined Other Comments:  None   Results for orders placed or performed during the hospital encounter of 01/07/24 (from the past 24 hours)  Urinalysis, Routine w reflex microscopic -Urine, Clean Catch   Collection Time: 01/07/24 11:31 PM  Result Value Ref Range   Color, Urine AMBER (A) YELLOW   APPearance CLOUDY (A) CLEAR   Specific Gravity, Urine 1.025 1.005 - 1.030   pH 5.0 5.0 - 8.0   Glucose, UA NEGATIVE NEGATIVE mg/dL   Hgb urine dipstick NEGATIVE NEGATIVE   Bilirubin Urine NEGATIVE NEGATIVE   Ketones, ur NEGATIVE NEGATIVE mg/dL   Protein, ur 30 (A) NEGATIVE mg/dL   Nitrite NEGATIVE NEGATIVE   Leukocytes,Ua TRACE (A) NEGATIVE   RBC / HPF 0-5 0 - 5 RBC/hpf   WBC, UA 0-5 0 - 5 WBC/hpf   Bacteria, UA RARE (A) NONE SEEN   Squamous Epithelial / HPF 11-20 0 - 5 /HPF   Mucus PRESENT   Wet prep, genital   Collection Time: 01/08/24 12:45 AM  Result Value Ref Range   Yeast Wet Prep HPF POC  NONE SEEN NONE SEEN   Trich, Wet Prep NONE SEEN NONE SEEN   Clue Cells Wet Prep HPF POC NONE SEEN NONE SEEN   WBC, Wet Prep HPF POC <10 <10   Sperm NONE SEEN   Type and screen [facility]   Collection Time: 01/08/24  1:03 AM  Result Value Ref Range   ABO/RH(D) A POS    Antibody Screen NEG    Sample Expiration      01/11/2024,2359 Performed at [facility], 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 G3P1011 at [redacted]w[redacted]d here for PTL evaluation in s/o severe Polyhydramnios   #Labor: Pt declined terb but did receive fluids in MAU, can repeat as needed. Expectant management #Pain: Per patient request #FWB: Cat 1 #GBS status:  negative #Feeding: Breastmilk  and Formula #Reproductive Life planning: Undecided  #Severe Poly: @[redacted]w[redacted]d , CWD, normal anatomy, cephalic presentation, right lateral placental lie, 2253g, 46% EFW, AFI 33.63 (LVP 12.8)  #Enlarged Thyroid : TFTs at 32wks wnl  -f/u admission TFTs  #Anemia: Hgb 7.0 2 weeks ago -f/u admission Hgb   #Uterine synechiae noted #Myoma   Ebony Goldstein, MD  01/08/2024, 2:08 AM

## 2024-01-08 NOTE — Progress Notes (Signed)
 Dr. Scherrie Curt at bedside and educated patient on benefits of Terbutaline . Patient declined medication witnessed by provider and this RN.

## 2024-01-09 LAB — T3, FREE: T3, Free: 2.6 pg/mL (ref 2.0–4.4)

## 2024-01-09 NOTE — MAU Provider Note (Signed)
 None      S: [name] is a 33 y.o. G3P1011 at [redacted]w[redacted]d  who presents to MAU today complaining contractions "back to back". She denies vaginal bleeding. She denies LOF. She reports normal fetal movement.    O: BP 118/70 (BP Location: Right Arm)   Pulse 80   Temp 98.4 F (36.9 C) (Oral)   Resp 18   LMP 04/29/2023   SpO2 99%  GENERAL: Well-developed, well-nourished female in no acute distress.  HEAD: Normocephalic, atraumatic.  CHEST: Normal effort of breathing, regular heart rate ABDOMEN: Soft, nontender, gravid  Cervical exam:  Dilation: 3 Effacement (%): 70 Station: -2 Presentation: Vertex Exam by:: J.Bellamy,RN   Fetal Monitoring: Baseline: 135 Variability: moderate Accelerations: present, but limited 15 x 15 accels Decelerations: absent   Overall reactive, but few 15 x 15 accels  BPP ordered and results 8/8  Contractions: irregular, mild to palpation   A: SIUP at [redacted]w[redacted]d  False labor Reassuring FHR tracing/BPP  P: D/C home Labor precautions/kick counts reviewed  Asher Lawn, CNM 01/09/2024 2:14 PM

## 2024-01-10 ENCOUNTER — Inpatient Hospital Stay (HOSPITAL_COMMUNITY)
Admission: AD | Admit: 2024-01-10 | Discharge: 2024-01-10 | Disposition: A | Discharge status: Home / Self Care | Attending: Family Medicine | Admitting: Family Medicine

## 2024-01-10 ENCOUNTER — Encounter (HOSPITAL_COMMUNITY): Payer: Self-pay | Admitting: Obstetrics and Gynecology

## 2024-01-10 ENCOUNTER — Other Ambulatory Visit

## 2024-01-10 DIAGNOSIS — O479 False labor, unspecified: Secondary | ICD-10-CM | POA: Diagnosis not present

## 2024-01-10 DIAGNOSIS — Z3A36 36 weeks gestation of pregnancy: Secondary | ICD-10-CM | POA: Diagnosis not present

## 2024-01-10 DIAGNOSIS — O4703 False labor before 37 completed weeks of gestation, third trimester: Secondary | ICD-10-CM | POA: Diagnosis present

## 2024-01-10 NOTE — MAU Note (Signed)
 I have communicated with S.Payne,CNM and reviewed vital signs:  Vitals:   01/10/24 1235  BP: 130/73  Pulse: 64  Resp: 17  Temp: 98.6 F (37 C)  SpO2: 99%    Vaginal exam:  Dilation: 4.5 Effacement (%): 60 Cervical Position: Middle Station: Ballotable Presentation: Vertex Exam by:: K.Alexzia Kasler,RN,   Also reviewed contraction pattern and that non-stress test is reactive.  It has been documented that patient is contracting every 3-8 minutes with no cervical change over 1.5 hours not indicating active labor.  Patient denies any other complaints.  Based on this report provider has given order for discharge.  A discharge order and diagnosis entered by a provider.   Labor discharge instructions reviewed with patient.

## 2024-01-10 NOTE — MAU Note (Signed)
 Sharon Barnett is a 33 y.o. at [redacted]w[redacted]d here in MAU reporting: arrival by EMS, contracting.was 5 cm 2 days ago.  Denies bleeding or leaking. Reports +FM Was having ctxs yesterday, was trying to wait it out. For induction tomorrow for poly.  Onset of complaint: 0800 Pain score: 10 Vitals:   01/10/24 1235  BP: 130/73  Pulse: 64  Resp: 17  Temp: 98.6 F (37 C)  SpO2: 99%     FHT:128 Lab orders placed from triage:        Cbg 77, 122/62

## 2024-01-10 NOTE — MAU Provider Note (Signed)
 [name] is a G3P1011 at [redacted]w[redacted]d seen in MAU for labor. RN labor check, not seen by provider. SVE by RN Dilation: 4.5 Effacement (%): 60 Cervical Position: Middle Station: Ballotable Presentation: Vertex Exam by:: K.Wilson,RN   NST - FHR: 130 bpm / moderate variability / accels present / decels absent / TOCO: regular every 2-6 mins   Plan:  D/C home with labor precautions Please return tomorrow at 11:45 PM for induction of labor  Ferdie Housekeeper, MD  01/10/2024 3:46 PM

## 2024-01-12 ENCOUNTER — Inpatient Hospital Stay (HOSPITAL_COMMUNITY)

## 2024-01-12 ENCOUNTER — Encounter: Payer: Self-pay | Admitting: Family Medicine

## 2024-01-12 ENCOUNTER — Other Ambulatory Visit: Payer: Self-pay

## 2024-01-12 ENCOUNTER — Encounter (HOSPITAL_COMMUNITY): Payer: Self-pay | Admitting: Family Medicine

## 2024-01-12 ENCOUNTER — Inpatient Hospital Stay (HOSPITAL_COMMUNITY): Admitting: Anesthesiology

## 2024-01-12 ENCOUNTER — Inpatient Hospital Stay (HOSPITAL_COMMUNITY)
Admission: RE | Admit: 2024-01-12 | Discharge: 2024-01-14 | DRG: 806 | Disposition: A | Discharge status: Home / Self Care | Attending: Obstetrics and Gynecology | Admitting: Obstetrics and Gynecology

## 2024-01-12 DIAGNOSIS — Z23 Encounter for immunization: Secondary | ICD-10-CM | POA: Diagnosis not present

## 2024-01-12 DIAGNOSIS — N856 Intrauterine synechiae: Secondary | ICD-10-CM | POA: Diagnosis present

## 2024-01-12 DIAGNOSIS — D62 Acute posthemorrhagic anemia: Secondary | ICD-10-CM | POA: Diagnosis not present

## 2024-01-12 DIAGNOSIS — D259 Leiomyoma of uterus, unspecified: Secondary | ICD-10-CM | POA: Diagnosis not present

## 2024-01-12 DIAGNOSIS — O3413 Maternal care for benign tumor of corpus uteri, third trimester: Secondary | ICD-10-CM | POA: Diagnosis not present

## 2024-01-12 DIAGNOSIS — O0990 Supervision of high risk pregnancy, unspecified, unspecified trimester: Principal | ICD-10-CM

## 2024-01-12 DIAGNOSIS — Z8249 Family history of ischemic heart disease and other diseases of the circulatory system: Secondary | ICD-10-CM

## 2024-01-12 DIAGNOSIS — O09899 Supervision of other high risk pregnancies, unspecified trimester: Secondary | ICD-10-CM

## 2024-01-12 DIAGNOSIS — O9081 Anemia of the puerperium: Secondary | ICD-10-CM | POA: Diagnosis not present

## 2024-01-12 DIAGNOSIS — Z3A37 37 weeks gestation of pregnancy: Secondary | ICD-10-CM

## 2024-01-12 DIAGNOSIS — O99013 Anemia complicating pregnancy, third trimester: Secondary | ICD-10-CM | POA: Diagnosis present

## 2024-01-12 DIAGNOSIS — Z833 Family history of diabetes mellitus: Secondary | ICD-10-CM | POA: Diagnosis not present

## 2024-01-12 DIAGNOSIS — O403XX0 Polyhydramnios, third trimester, not applicable or unspecified: Secondary | ICD-10-CM | POA: Diagnosis not present

## 2024-01-12 DIAGNOSIS — O99019 Anemia complicating pregnancy, unspecified trimester: Secondary | ICD-10-CM | POA: Diagnosis present

## 2024-01-12 DIAGNOSIS — Z7982 Long term (current) use of aspirin: Secondary | ICD-10-CM | POA: Diagnosis not present

## 2024-01-12 DIAGNOSIS — Z2839 Other underimmunization status: Secondary | ICD-10-CM

## 2024-01-12 LAB — CBC
HCT: 27.6 % — ABNORMAL LOW (ref 36.0–46.0)
Hemoglobin: 8.4 g/dL — ABNORMAL LOW (ref 12.0–15.0)
MCH: 26.7 pg (ref 26.0–34.0)
MCHC: 30.4 g/dL (ref 30.0–36.0)
MCV: 87.6 fL (ref 80.0–100.0)
Platelets: 264 10*3/uL (ref 150–400)
RBC: 3.15 MIL/uL — ABNORMAL LOW (ref 3.87–5.11)
RDW: 22.7 % — ABNORMAL HIGH (ref 11.5–15.5)
WBC: 7.9 10*3/uL (ref 4.0–10.5)
nRBC: 0.6 % — ABNORMAL HIGH (ref 0.0–0.2)

## 2024-01-12 LAB — TYPE AND SCREEN
ABO/RH(D): A POS
Antibody Screen: NEGATIVE
Unit division: 0
Unit division: 0

## 2024-01-12 LAB — BPAM RBC
Blood Product Expiration Date: 202505242359
Blood Product Expiration Date: 202505282359
Unit Type and Rh: 6200
Unit Type and Rh: 6200

## 2024-01-12 LAB — RPR: RPR Ser Ql: NONREACTIVE

## 2024-01-12 MED ORDER — SODIUM CHLORIDE 0.9% FLUSH
3.0000 mL | Freq: Two times a day (BID) | INTRAVENOUS | Status: DC
  Administered 2024-01-12 – 2024-01-13 (×2): 3 mL via INTRAVENOUS

## 2024-01-12 MED ORDER — LACTATED RINGERS IV SOLN: 500.0000 mL | Freq: Once | INTRAVENOUS | Status: DC

## 2024-01-12 MED ORDER — SOD CITRATE-CITRIC ACID 500-334 MG/5ML PO SOLN: 30.0000 mL | ORAL | Status: DC | PRN

## 2024-01-12 MED ORDER — OXYTOCIN-SODIUM CHLORIDE 30-0.9 UT/500ML-% IV SOLN
1.0000 m[IU]/min | INTRAVENOUS | Status: DC
  Administered 2024-01-12: 2 m[IU]/min via INTRAVENOUS

## 2024-01-12 MED ORDER — ACETAMINOPHEN 325 MG PO TABS: 650.0000 mg | ORAL_TABLET | ORAL | Status: DC | PRN

## 2024-01-12 MED ORDER — COCONUT OIL OIL: 1.0000 | TOPICAL_OIL | Status: DC | PRN

## 2024-01-12 MED ORDER — MEASLES, MUMPS & RUBELLA VAC IJ SOLR
0.5000 mL | Freq: Once | INTRAMUSCULAR | Status: DC
  Filled 2024-01-12: qty 0.5

## 2024-01-12 MED ORDER — TERBUTALINE SULFATE 1 MG/ML IJ SOLN: 0.2500 mg | Freq: Once | INTRAMUSCULAR | Status: DC | PRN

## 2024-01-12 MED ORDER — METHYLPREDNISOLONE SODIUM SUCC 125 MG IJ SOLR: 125.0000 mg | Freq: Once | INTRAMUSCULAR | Status: DC | PRN

## 2024-01-12 MED ORDER — EPINEPHRINE 0.3 MG/0.3ML IJ SOAJ: 0.3000 mg | Freq: Once | INTRAMUSCULAR | Status: DC | PRN

## 2024-01-12 MED ORDER — SODIUM CHLORIDE 0.9 % IV SOLN
500.0000 mg | Freq: Once | INTRAVENOUS | Status: AC
  Administered 2024-01-12: 500 mg via INTRAVENOUS
  Filled 2024-01-12: qty 25

## 2024-01-12 MED ORDER — EPHEDRINE 5 MG/ML INJ: 10.0000 mg | INTRAVENOUS | Status: DC | PRN

## 2024-01-12 MED ORDER — SIMETHICONE 80 MG PO CHEW: 80.0000 mg | CHEWABLE_TABLET | ORAL | Status: DC | PRN

## 2024-01-12 MED ORDER — LACTATED RINGERS IV SOLN: INTRAVENOUS | Status: DC

## 2024-01-12 MED ORDER — TRANEXAMIC ACID-NACL 1000-0.7 MG/100ML-% IV SOLN
INTRAVENOUS | Status: AC
Start: 2024-01-12 — End: 2024-01-12
  Administered 2024-01-12: 1000 mg via INTRAVENOUS
  Filled 2024-01-12: qty 100

## 2024-01-12 MED ORDER — ACETAMINOPHEN 325 MG PO TABS
650.0000 mg | ORAL_TABLET | ORAL | Status: DC | PRN
  Administered 2024-01-12: 650 mg via ORAL
  Filled 2024-01-12: qty 2

## 2024-01-12 MED ORDER — LACTATED RINGERS IV SOLN: 500.0000 mL | INTRAVENOUS | Status: DC | PRN

## 2024-01-12 MED ORDER — DIPHENHYDRAMINE HCL 25 MG PO CAPS: 25.0000 mg | ORAL_CAPSULE | Freq: Four times a day (QID) | ORAL | Status: DC | PRN

## 2024-01-12 MED ORDER — FENTANYL CITRATE (PF) 100 MCG/2ML IJ SOLN
50.0000 ug | INTRAMUSCULAR | Status: DC | PRN
  Administered 2024-01-12: 100 ug via INTRAVENOUS
  Filled 2024-01-12: qty 2

## 2024-01-12 MED ORDER — TRANEXAMIC ACID-NACL 1000-0.7 MG/100ML-% IV SOLN: 1000.0000 mg | Freq: Once | INTRAVENOUS | Status: AC

## 2024-01-12 MED ORDER — OXYTOCIN-SODIUM CHLORIDE 30-0.9 UT/500ML-% IV SOLN
INTRAVENOUS | Status: AC
  Filled 2024-01-12: qty 500

## 2024-01-12 MED ORDER — WITCH HAZEL-GLYCERIN EX PADS: 1.0000 | MEDICATED_PAD | CUTANEOUS | Status: DC | PRN

## 2024-01-12 MED ORDER — DIPHENHYDRAMINE HCL 50 MG/ML IJ SOLN: 12.5000 mg | INTRAMUSCULAR | Status: DC | PRN

## 2024-01-12 MED ORDER — OXYCODONE-ACETAMINOPHEN 5-325 MG PO TABS: 1.0000 | ORAL_TABLET | ORAL | Status: DC | PRN

## 2024-01-12 MED ORDER — FLEET ENEMA RE ENEM: 1.0000 | ENEMA | Freq: Every day | RECTAL | Status: DC | PRN

## 2024-01-12 MED ORDER — TETANUS-DIPHTH-ACELL PERTUSSIS 5-2.5-18.5 LF-MCG/0.5 IM SUSY
0.5000 mL | PREFILLED_SYRINGE | Freq: Once | INTRAMUSCULAR | Status: DC
  Filled 2024-01-12: qty 0.5

## 2024-01-12 MED ORDER — ONDANSETRON HCL 4 MG/2ML IJ SOLN
4.0000 mg | Freq: Four times a day (QID) | INTRAMUSCULAR | Status: DC | PRN
  Administered 2024-01-12: 4 mg via INTRAVENOUS
  Filled 2024-01-12: qty 2

## 2024-01-12 MED ORDER — OXYCODONE HCL 5 MG PO TABS: 5.0000 mg | ORAL_TABLET | ORAL | Status: DC | PRN

## 2024-01-12 MED ORDER — ALBUTEROL SULFATE (2.5 MG/3ML) 0.083% IN NEBU: 2.5000 mg | INHALATION_SOLUTION | Freq: Once | RESPIRATORY_TRACT | Status: DC | PRN

## 2024-01-12 MED ORDER — PHENYLEPHRINE 80 MCG/ML (10ML) SYRINGE FOR IV PUSH (FOR BLOOD PRESSURE SUPPORT): 80.0000 ug | PREFILLED_SYRINGE | INTRAVENOUS | Status: DC | PRN

## 2024-01-12 MED ORDER — LIDOCAINE HCL (PF) 1 % IJ SOLN
INTRAMUSCULAR | Status: DC | PRN
  Administered 2024-01-12: 8 mL via EPIDURAL

## 2024-01-12 MED ORDER — OXYCODONE HCL 5 MG PO TABS: 10.0000 mg | ORAL_TABLET | ORAL | Status: DC | PRN

## 2024-01-12 MED ORDER — ZOLPIDEM TARTRATE 5 MG PO TABS: 5.0000 mg | ORAL_TABLET | Freq: Every evening | ORAL | Status: DC | PRN

## 2024-01-12 MED ORDER — IBUPROFEN 600 MG PO TABS
600.0000 mg | ORAL_TABLET | Freq: Four times a day (QID) | ORAL | Status: DC
  Administered 2024-01-12 – 2024-01-14 (×7): 600 mg via ORAL
  Filled 2024-01-12 (×8): qty 1

## 2024-01-12 MED ORDER — OXYTOCIN BOLUS FROM INFUSION
333.0000 mL | Freq: Once | INTRAVENOUS | Status: AC
  Administered 2024-01-12: 333 mL via INTRAVENOUS

## 2024-01-12 MED ORDER — IRON SUCROSE 500 MG IVPB - SIMPLE MED
500.0000 mg | Freq: Once | INTRAVENOUS | Status: DC
Start: 2024-01-12 — End: 2024-01-12

## 2024-01-12 MED ORDER — IRON SUCROSE 500 MG IVPB - SIMPLE MED: 500.0000 mg | Freq: Once | INTRAVENOUS | Status: DC

## 2024-01-12 MED ORDER — BENZOCAINE-MENTHOL 20-0.5 % EX AERO
1.0000 | INHALATION_SPRAY | CUTANEOUS | Status: DC | PRN
  Filled 2024-01-12: qty 56

## 2024-01-12 MED ORDER — PHENYLEPHRINE 80 MCG/ML (10ML) SYRINGE FOR IV PUSH (FOR BLOOD PRESSURE SUPPORT)
80.0000 ug | PREFILLED_SYRINGE | INTRAVENOUS | Status: DC | PRN
  Filled 2024-01-12: qty 10

## 2024-01-12 MED ORDER — FENTANYL-BUPIVACAINE-NACL 0.5-0.125-0.9 MG/250ML-% EP SOLN: 12.0000 mL/h | EPIDURAL | Status: DC | PRN

## 2024-01-12 MED ORDER — LIDOCAINE HCL (PF) 1 % IJ SOLN: 30.0000 mL | INTRAMUSCULAR | Status: DC | PRN

## 2024-01-12 MED ORDER — FENTANYL-BUPIVACAINE-NACL 0.5-0.125-0.9 MG/250ML-% EP SOLN
12.0000 mL/h | EPIDURAL | Status: DC | PRN
  Administered 2024-01-12: 12 mL/h via EPIDURAL
  Filled 2024-01-12: qty 250

## 2024-01-12 MED ORDER — OXYCODONE-ACETAMINOPHEN 5-325 MG PO TABS: 2.0000 | ORAL_TABLET | ORAL | Status: DC | PRN

## 2024-01-12 MED ORDER — SODIUM CHLORIDE 0.9 % IV SOLN: INTRAVENOUS | Status: AC | PRN

## 2024-01-12 MED ORDER — DIBUCAINE (PERIANAL) 1 % EX OINT: 1.0000 | TOPICAL_OINTMENT | CUTANEOUS | Status: DC | PRN

## 2024-01-12 MED ORDER — ONDANSETRON HCL 4 MG/2ML IJ SOLN: 4.0000 mg | INTRAMUSCULAR | Status: DC | PRN

## 2024-01-12 MED ORDER — SODIUM CHLORIDE 0.9 % IV BOLUS: 500.0000 mL | Freq: Once | INTRAVENOUS | Status: DC | PRN

## 2024-01-12 MED ORDER — SODIUM CHLORIDE 0.9% FLUSH: 3.0000 mL | INTRAVENOUS | Status: DC | PRN

## 2024-01-12 MED ORDER — DIPHENHYDRAMINE HCL 50 MG/ML IJ SOLN: 25.0000 mg | Freq: Once | INTRAMUSCULAR | Status: DC | PRN

## 2024-01-12 MED ORDER — PRENATAL MULTIVITAMIN CH
1.0000 | ORAL_TABLET | Freq: Every day | ORAL | Status: DC
  Administered 2024-01-12 – 2024-01-13 (×2): 1 via ORAL
  Filled 2024-01-12 (×2): qty 1

## 2024-01-12 MED ORDER — SENNOSIDES-DOCUSATE SODIUM 8.6-50 MG PO TABS
2.0000 | ORAL_TABLET | ORAL | Status: DC
  Administered 2024-01-12 – 2024-01-13 (×2): 2 via ORAL
  Filled 2024-01-12 (×3): qty 2

## 2024-01-12 MED ORDER — FENTANYL CITRATE (PF) 100 MCG/2ML IJ SOLN
100.0000 ug | Freq: Once | INTRAMUSCULAR | Status: AC
  Administered 2024-01-12: 100 ug via INTRAVENOUS
  Filled 2024-01-12: qty 2

## 2024-01-12 MED ORDER — OXYTOCIN-SODIUM CHLORIDE 30-0.9 UT/500ML-% IV SOLN
2.5000 [IU]/h | INTRAVENOUS | Status: DC
  Administered 2024-01-12: 2.5 [IU]/h via INTRAVENOUS

## 2024-01-12 MED ORDER — ONDANSETRON HCL 4 MG PO TABS: 4.0000 mg | ORAL_TABLET | ORAL | Status: DC | PRN

## 2024-01-12 MED ORDER — HYDROXYZINE HCL 50 MG PO TABS: 50.0000 mg | ORAL_TABLET | Freq: Four times a day (QID) | ORAL | Status: DC | PRN

## 2024-01-12 MED ORDER — SODIUM CHLORIDE 0.9 % IV SOLN: 250.0000 mL | INTRAVENOUS | Status: DC | PRN

## 2024-01-12 NOTE — H&P (Addendum)
 Obstetric History and Physical  Sharon Barnett is a 33 y.o. G3P1011 with IUP at [redacted]w[redacted]d presenting for IOL for symptomatic severe polyhydramnios. Patient states she has been having irregular contractions, no vaginal bleeding, intact membranes, with active fetal movement.   No other concerning symptoms.  Last ultrasound data: 01/08/24:  AFI 29.1, MVP 9.9, cephalic 01/03/24: BPP 8/8, AFI 16.1, MVP 10.93 12/20/23: EFW 2253 gm (4 lb 15 oz)/46 %, AFI 34, MVP 12.8   Prenatal Course Source of Care: [facility]  with onset of care at 12 weeks Pregnancy complications or risks: Patient Active Problem List   Diagnosis Date Noted   Polyhydramnios affecting pregnancy in third trimester 12/26/2023   Enlarged thyroid  12/11/2023   Anemia affecting pregnancy in third trimester 12/10/2023   B12 deficiency 12/10/2023   Anemia during pregnancy 12/03/2023   Hyperemesis gravidarum 10/17/2023   Fibroid uterus 09/17/2023   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   HGSIL (high grade squamous intraepithelial lesion) on Pap smear of cervix 08/07/2023   Rubella non-immune status, antepartum 08/01/2023   Supervision of high risk pregnancy, antepartum 07/30/2023   Migraine 05/08/2023   NURSING  PROVIDER  Office Location [facility] Dating by US  @ 7w  [facility] Model Centering Anatomy U/S WNL, uterine synechiae noted, myoma  Initiated care at  [employer]  English              LAB RESULTS   Support Person Sharon Barnett (sister) Genetics NIPS: LR female AFP: DNKA    NT/IT (FT only)     Carrier Screen Horizon: Neg x 4  Rhogam  A/Positive/-- (11/25 1119) A1C/GTT Early: 4.7 Third trimester: 91/129/85  Flu Vaccine Declined 07/30/23    TDaP Vaccine  Blood Type A/Positive/-- (11/25 1119)  Covid Vaccine  Antibody Negative (11/25 1119)  RSV Vaccine  Rubella <0.90 (11/25 1119)  Feeding Plan both RPR Non Reactive (11/25 1119)  Contraception Pills HBsAg Negative (11/25 1119)  Circumcision  HIV Non Reactive (11/25 1119)   Pediatrician   HCVAb Non Reactive (11/25 1119)  Prenatal Classes     BTL Consent ? Pap Diagnosis  Date Value Ref Range Status  07/30/2023 (A)  Final   - High grade squamous intraepithelial lesion (HSIL)  [ ] Colpo postpartum  BTL Pre-payment  GC/CT Initial:  neg 36wks:  neg  VBAC Consent NA GBS  Negative      Past Medical History:  Diagnosis Date   ADHD (attention deficit hyperactivity disorder)    Anemia    Complication of anesthesia    Hypersensitive to anesthesia (wisdom teeth)   Fibroid    Headache    Hyperemesis gravidarum    UTI (urinary tract infection)     Past Surgical History:  Procedure Laterality Date   INDUCED ABORTION     Elective   WISDOM TOOTH EXTRACTION      OB History  Gravida Para Term Preterm AB Living  3 1 1  0 1 1  SAB IAB Ectopic Multiple Live Births  0 1 0 0 1    # Outcome Date GA Lbr Len/2nd Weight Sex Type Anes PTL Lv  3 Current           2 IAB 07/25/22 [redacted]w[redacted]d         1 Term 07/18/16 [redacted]w[redacted]d  3680 g M Vag-Spont   LIV    Social History   Socioeconomic History   Marital status: Single  Spouse name: Not on file   Number of children: Not on file   Years of education: Not on file   Highest education level: GED or equivalent  Occupational History   Occupation: [employer]  Tobacco Use   Smoking status: Never   Smokeless tobacco: Never  Vaping Use   Vaping status: Never Used  Substance and Sexual Activity   Alcohol use: No   Drug use: Not Currently    Types: Marijuana    Comment: not since +preg   Sexual activity: Not Currently    Birth control/protection: None  Other Topics Concern   Not on file  Social History Narrative   Not on file   Social Drivers of Health   Financial Resource Strain: Low Risk  (05/29/2023)   Overall Financial Resource Strain (CARDIA)    Difficulty of Paying Living Expenses: Not hard at all  Food Insecurity: No Food Insecurity (01/12/2024)   Hunger Vital Sign    Worried About Running Out of Food in the  Last Year: Never true    Ran Out of Food in the Last Year: Never true  Transportation Needs: No Transportation Needs (01/12/2024)   PRAPARE - Administrator, Civil Service (Medical): No    Lack of Transportation (Non-Medical): No  Physical Activity: Unknown (05/29/2023)   Exercise Vital Sign    Days of Exercise per Week: 0 days    Minutes of Exercise per Session: Not on file  Stress: No Stress Concern Present (05/29/2023)   [employer] of Occupational Health - Occupational Stress Questionnaire    Feeling of Stress : Only a little  Social Connections: Socially Isolated (09/06/2023)   Social Connection and Isolation Panel [NHANES]    Frequency of Communication with Friends and Family: Never    Frequency of Social Gatherings with Friends and Family: Never    Attends Religious Services: Never    Diplomatic Services operational officer: No    Attends Engineer, structural: Not on file    Marital Status: Never married    Family History  Problem Relation Age of Onset   Depression Mother    Diabetes Mother    Hypertension Mother    Fibroids Mother    Bipolar disorder Mother    Thyroid  disease Mother        unsure specifics   Sarcoidosis Father    Asthma Sister    Depression Sister    Hearing loss Sister    Hyperlipidemia Sister    Hypertension Sister    Miscarriages / Stillbirths Sister    Hearing loss Maternal Grandmother    Diabetes Maternal Grandmother    Hypertension Maternal Grandmother    Stroke Maternal Grandmother    Brain cancer Maternal Grandmother    Hodgkin's lymphoma Niece     Medications Prior to Admission  Medication Sig Dispense Refill Last Dose/Taking   aspirin  EC 81 MG tablet Take 1 tablet (81 mg total) by mouth daily. 90 tablet 3    cyclobenzaprine  (FLEXERIL ) 10 MG tablet Take 1 tablet (10 mg total) by mouth 3 (three) times daily as needed for muscle spasms. 20 tablet 0    diphenhydrAMINE  (BENADRYL ) 25 mg capsule Take 1 capsule (25 mg  total) by mouth every 6 (six) hours as needed (headache). 30 capsule 2    famotidine  (PEPCID ) 20 MG tablet Take 1 tablet (20 mg total) by mouth 2 (two) times daily. 30 tablet 0    Ferric Maltol  (ACCRUFER ) 30 MG CAPS Take 1 capsule (  30 mg total) by mouth daily. 30 capsule 0    NIFEdipine  (PROCARDIA ) 10 MG capsule Take 1 capsule (10 mg total) by mouth 3 (three) times daily. 10 capsule 0    zolpidem  (AMBIEN ) 5 MG tablet Take 1 tablet (5 mg total) by mouth at bedtime as needed for sleep.       No Known Allergies  Review of Systems: Negative except for what is mentioned in HPI.  Physical Exam: BP (!) 148/92   Pulse (!) 58   LMP 04/29/2023  CONSTITUTIONAL: Well-developed, well-nourished female in no acute distress.  HENT:  Normocephalic, atraumatic, External right and left ear normal. Oropharynx is clear and moist EYES: Conjunctivae and EOM are normal. Pupils are equal, round, and reactive to light. No scleral icterus.  NECK: Normal range of motion, supple, no masses SKIN: Skin is warm and dry. No rash noted. Not diaphoretic. No erythema. No pallor. NEUROLOGIC: Alert and oriented to person, place, and time. Normal reflexes, muscle tone coordination. No cranial nerve deficit noted. PSYCHIATRIC: Normal mood and affect. Normal behavior. Normal judgment and thought content. CARDIOVASCULAR: Normal heart rate noted, regular rhythm RESPIRATORY: Effort and breath sounds normal, no problems with respiration noted ABDOMEN: Soft, nontender, nondistended, gravid. MUSCULOSKELETAL: Normal range of motion. No edema and no tenderness. 2+ distal pulses.  Cervical Exam: Dilatation 5 cm   Effacement 70%   Station Ballotable   Presentation: Cephalic by exam and confirmed on bedside ultrasound FHT:  Baseline rate 125 bpm   Variability moderate  Accelerations present   Decelerations none Contractions: Every 1-3 mins   Pertinent Labs/Studies:   No results found for this or any previous visit (from the past 24  hours).  Assessment : MYLENE LUEVANO is a 33 y.o. G3P1011 at [redacted]w[redacted]d being induction of labor due to polyhydramnios.  Plan: Induction of labor: Will proceed with pitocin  per protocol given favorable cervix, and AROM when head is more engaged.  Discussed risks of cord prolapse with AROM given increased amniotic fluid volume, and subsequent need for emergency cesarean section if this occurs. Continue close monitoring.  Analgesia as needed. Anemia: Will follow up admission hemoglobin, last one in office was 8.2.  High risk of PPH given uterine distention  due to polyhydramnios, will give TXA, uterotonics prophylactically during delivery to cut down on blood loss.  Low threshold for JADA placement if needed. FWB: Reassuring fetal heart tracing.  GBS negative Delivery plan: Hopeful for vaginal delivery   Lenoard Rad, MD, FACOG Obstetrician & Gynecologist, [facility] for [employer], [facility]

## 2024-01-12 NOTE — Anesthesia Procedure Notes (Signed)
 Epidural Patient location during procedure: OB Start time: 01/12/2024 11:07 AM End time: 01/12/2024 11:10 AM  Staffing Anesthesiologist: Rhenda Cedars, MD Performed: other anesthesia staff   Preanesthetic Checklist Completed: patient identified, IV checked, site marked, risks and benefits discussed, surgical consent, monitors and equipment checked, pre-op evaluation and timeout performed  Epidural Patient position: sitting Prep: DuraPrep and site prepped and draped Patient monitoring: continuous pulse ox and blood pressure Approach: midline Location: L4-L5 Injection technique: LOR air  Needle:  Needle type: Tuohy  Needle gauge: 17 G Needle length: 9 cm and 9 Needle insertion depth: 5 cm cm Catheter type: closed end flexible Catheter size: 19 Gauge Catheter at skin depth: 10 cm Test dose: negative  Assessment Events: blood not aspirated, no cerebrospinal fluid, injection not painful, no injection resistance, no paresthesia and negative IV test

## 2024-01-12 NOTE — Lactation Note (Signed)
 This note was copied from a baby's chart. Lactation Consultation Note  Patient Name: Sharon Barnett Date: 01/12/2024 Age:32 hours   P2- Per MOB's chart, MOB does not want to see the [facility] team while in the hospital. [facility] requested for her RN to confirm this with MOB before we complete her on our board. LC is waiting to hear back from the RN.  Vernette Goo BS, IBCLC 01/12/2024, 4:46 PM

## 2024-01-12 NOTE — Progress Notes (Signed)
 Faculty Note  S: Patient very uncomfortable with pressure.   O: BP 131/83   Pulse 64   Temp 98 F (36.7 C) (Oral)   Resp 17   LMP 04/29/2023   Gen: alert, oriented SVE: 4/70/-2  FHT: 130s bpm, moderate variability, accels  present, no decels Toco: ctx every 1-2 min   A/P: Pt is 33 y.o. Z6X0960 @ [redacted]w[redacted]d who is admitted for induction of labor for symptomatic severe polyhydramnios. Has been on pitocin , fetal head well applied enough to cervix for AROM via FSE. Risks/benefits FSE and AROM discussed with patient, she is agreeable. AROM performed with FSE, clear fluid, large amount expressed, no cord palpable under head.   Epidural now at patient request.   K. Andi Kaufmann, MD, [facility] Attending Center for [facility])

## 2024-01-12 NOTE — Anesthesia Preprocedure Evaluation (Signed)
 Anesthesia Evaluation  Patient identified by MRN, date of birth, ID band Patient awake    Reviewed: Allergy & Precautions, H&P , NPO status , Patient's Chart, lab work & pertinent test results, reviewed documented beta blocker date and time   History of Anesthesia Complications (+) history of anesthetic complications  Airway Mallampati: II  TM Distance: >3 FB Neck ROM: full    Dental no notable dental hx.    Pulmonary neg pulmonary ROS   Pulmonary exam normal breath sounds clear to auscultation       Cardiovascular negative cardio ROS Normal cardiovascular exam Rhythm:regular Rate:Normal     Neuro/Psych  Headaches PSYCHIATRIC DISORDERS         GI/Hepatic negative GI ROS, Neg liver ROS,,,  Endo/Other  negative endocrine ROS    Renal/GU negative Renal ROS  negative genitourinary   Musculoskeletal   Abdominal   Peds  Hematology  (+) Blood dyscrasia, anemia   Anesthesia Other Findings   Reproductive/Obstetrics (+) Pregnancy                              Anesthesia Physical Anesthesia Plan  ASA: 2  Anesthesia Plan: Epidural   Post-op Pain Management: Minimal or no pain anticipated   Induction: Intravenous  PONV Risk Score and Plan: 2 and Treatment may vary due to age or medical condition  Airway Management Planned: Natural Airway  Additional Equipment: Fetal Monitoring  Intra-op Plan:   Post-operative Plan:   Informed Consent: I have reviewed the patients History and Physical, chart, labs and discussed the procedure including the risks, benefits and alternatives for the proposed anesthesia with the patient or authorized representative who has indicated his/her understanding and acceptance.     Dental Advisory Given  Plan Discussed with: Anesthesiologist  Anesthesia Plan Comments: (Labs checked- platelets confirmed with RN in room. Fetal heart tracing, per RN, reported to be  stable enough for sitting procedure. Discussed epidural, and patient consents to the procedure:  included risk of possible headache,backache, failed block, allergic reaction, and nerve injury. This patient was asked if she had any questions or concerns before the procedure started.)         Anesthesia Quick Evaluation

## 2024-01-12 NOTE — Progress Notes (Signed)
 Labor Progress Note Sharon Barnett is a 33 y.o. G3P1011 at [redacted]w[redacted]d presented for IOL due to severe polyhydramnios  S: Feeling uncomfortable w contractions  O:  BP 117/72   Pulse 80   Resp 16   LMP 04/29/2023  EFM: 115/mod/-a/-d  CVE: Dilation: 5 Effacement (%): 70 Station: Ballotable Presentation: Vertex Exam by:: Sharon Dubow,Sharon Barnett   A&P: 33 y.o. Z6X0960 [redacted]w[redacted]d here for IOL due to symptomatic severe polyhydramnios  #Labor: Essentially unchanged, fetal head still ballotable, even during ctx so AROM deferred. Hopefully with continuing Pitocin  fetal head better engaged on cervix on next check so we can offer AROM. #Pain: Per pt request #FWB: Cat I #GBS negative  #Anemia of pregnancy: HgB 8.4  plan for TXA and Methergine at delivery  #Rubella NI  Melanie Spires, Sharon Barnett 6:12 AM

## 2024-01-12 NOTE — Lactation Note (Signed)
 This note was copied from a baby's chart. Lactation Consultation Note  Patient Name: Sharon Barnett [id number] Date: 01/12/2024 Age:32 hours Reason for consult: Initial assessment;Early term 37-38.6wks  P2- Per RN, MOB confirmed that she does not want to be seen by the [facility] team while in the hospital. MOB is now completed on our board.  Feeding Mother's Current Feeding Choice: Breast Milk and Formula Nipple Type: Slow - flow  Consult Status Consult Status: Complete (mother declined follow up) Date: 01/12/24    Vernette Goo BS, IBCLC 01/12/2024, 7:04 PM

## 2024-01-12 NOTE — Discharge Summary (Signed)
 Postpartum Discharge Summary  Date of Service updated***     Patient Name: Sharon Barnett DOB: 08-09-1991 MRN: [id number]  Date of admission: 01/12/2024 Delivery date:01/12/2024 Delivering provider: CHUBB, CASEY C Date of discharge: 01/12/2024  Admitting diagnosis: Polyhydramnios in third trimester complication, single or unspecified fetus [O40.3XX0] Intrauterine pregnancy: [redacted]w[redacted]d     Secondary diagnosis:  Principal Problem:   Polyhydramnios in third trimester complication, single or unspecified fetus Active Problems:   Supervision of high risk pregnancy, antepartum   Rubella non-immune status, antepartum   Fibroid uterus   Uterine synechiae   Anemia during pregnancy   Anemia affecting pregnancy in third trimester   Polyhydramnios affecting pregnancy in third trimester  Additional problems: ***    Discharge diagnosis: Term Pregnancy Delivered, Anemia, and Severe polyhydramnios                                               Post partum procedures:{Postpartum procedures:23558} Augmentation: AROM and Pitocin  Complications: None  Hospital course: Induction of Labor With Vaginal Delivery   33 y.o. yo G2X5284 at [redacted]w[redacted]d was admitted to the hospital 01/12/2024 for induction of labor.  Indication for induction: Severe Polyhydramnios.  Patient had an labor course that was uncomplicated. Membrane Rupture Time/Date: 10:44 AM,01/12/2024  Delivery Method:Vaginal, Spontaneous Operative Delivery:N/A Episiotomy: None Lacerations:  None Details of delivery can be found in separate delivery note.  Patient had a postpartum course complicated by***. Patient is discharged home 01/12/24.  Newborn Data: Birth date:01/12/2024 Birth time:11:52 AM Gender:Female Living status:Living Apgars:8 ,9  Weight:2970 g  Magnesium Sulfate received: {Mag received:30440022} BMZ received: No Rhophylac:N/A MMR:{MMR:30440033}ordered*** T-DaP:{Tdap:23962}ordered*** Flu: No RSV Vaccine received:  No Transfusion:{Transfusion received:30440034}  Immunizations received:  There is no immunization history on file for this patient.  Physical exam  Vitals:   01/12/24 1215 01/12/24 1230 01/12/24 1245 01/12/24 1300  BP: (!) 156/93 (!) 140/91 137/68 129/69  Pulse: 82 (!) 56 60 62  Resp: 18 15 18 20   Temp:      TempSrc:      SpO2:       General: {Exam; general:21111117} Lochia: {Desc; appropriate/inappropriate:30686::"appropriate"} Uterine Fundus: {Desc; firm/soft:30687} Incision: {Exam; incision:21111123} DVT Evaluation: {Exam; dvt:2111122} Labs: Lab Results  Component Value Date   WBC 7.9 01/12/2024   HGB 8.4 (L) 01/12/2024   HCT 27.6 (L) 01/12/2024   MCV 87.6 01/12/2024   PLT 264 01/12/2024      Latest Ref Rng & Units 12/24/2023   12:25 PM  CMP  Glucose 70 - 99 mg/dL 71   BUN 6 - 20 mg/dL 9   Creatinine 1.32 - 4.40 mg/dL 1.02   Sodium 725 - 366 mmol/L 136   Potassium 3.5 - 5.1 mmol/L 3.4   Chloride 98 - 111 mmol/L 106   CO2 22 - 32 mmol/L 19   Calcium  8.9 - 10.3 mg/dL 8.5   Total Protein 6.5 - 8.1 g/dL 6.0   Total Bilirubin 0.0 - 1.2 mg/dL 0.8   Alkaline Phos 38 - 126 U/L 97   AST 15 - 41 U/L 15   ALT 0 - 44 U/L 8    Edinburgh Score:     No data to display         No data recorded  After visit meds:  Allergies as of 01/12/2024   No Known Allergies   Med Rec must be completed  prior to using this [facility]***        Discharge home in stable condition Infant Feeding: {Baby feeding:23562} Infant Disposition:{CHL IP OB HOME WITH UJWJXB:14782} Discharge instruction: per After Visit Summary and Postpartum booklet. Activity: Advance as tolerated. Pelvic rest for 6 weeks.  Diet: {OB NFAO:13086578} Future Appointments:No future appointments. Follow up Visit: Message to [facility]  Please schedule this patient for a In person postpartum visit in 4 weeks with the following provider: Any provider. Additional Postpartum F/U:n/a   High risk pregnancy  complicated by: Severe Poly, Anemia, RNI Delivery mode:  Vaginal, Spontaneous Anticipated Birth Control:  Pills    01/12/2024 Ebony Goldstein, MD

## 2024-01-13 LAB — CBC
HCT: 24.1 % — ABNORMAL LOW (ref 36.0–46.0)
Hemoglobin: 7.3 g/dL — ABNORMAL LOW (ref 12.0–15.0)
MCH: 26.6 pg (ref 26.0–34.0)
MCHC: 30.3 g/dL (ref 30.0–36.0)
MCV: 88 fL (ref 80.0–100.0)
Platelets: 233 10*3/uL (ref 150–400)
RBC: 2.74 MIL/uL — ABNORMAL LOW (ref 3.87–5.11)
RDW: 22.6 % — ABNORMAL HIGH (ref 11.5–15.5)
WBC: 10.1 10*3/uL (ref 4.0–10.5)
nRBC: 0.3 % — ABNORMAL HIGH (ref 0.0–0.2)

## 2024-01-13 MED ORDER — FERROUS SULFATE 325 (65 FE) MG PO TABS
325.0000 mg | ORAL_TABLET | ORAL | Status: DC
  Administered 2024-01-13: 325 mg via ORAL
  Filled 2024-01-13: qty 1

## 2024-01-13 NOTE — Progress Notes (Signed)
 POSTPARTUM PROGRESS NOTE  Post Partum Day 1  Subjective:  Sharon Barnett is a 33 y.o. Z6X0960 s/p SVD at [redacted]w[redacted]d.  She reports she is doing well. No acute events overnight. She denies any problems with ambulating, voiding or po intake. Denies nausea or vomiting.  Pain is well controlled.  Lochia is appropriate.  Objective: Blood pressure 121/67, pulse 66, temperature 98.3 F (36.8 C), temperature source Oral, resp. rate 16, last menstrual period 04/29/2023, SpO2 100%, unknown if currently breastfeeding.  Physical Exam:  General: alert, cooperative and no distress Chest: no respiratory distress Heart:regular rate, distal pulses intact Uterine Fundus: firm, appropriately tender DVT Evaluation: No calf swelling or tenderness Extremities: trace edema Skin: warm, dry  Recent Labs    01/12/24 0148 01/13/24 0452  HGB 8.4* 7.3*  HCT 27.6* 24.1*    Assessment/Plan: Sharon Barnett is a 33 y.o. A5W0981 s/p SVD at [redacted]w[redacted]d   PPD#1 - Doing well  Routine postpartum care ABLA on Chronic blood loss anemia; expected, asymptomatic  Start iron  supplementation  Contraception: OCP vs Mirena Feeding: Formula Dispo: Plan for discharge 5/12.   LOS: 1 day   [place], MD OB Fellow  01/13/2024, 9:06 AM

## 2024-01-13 NOTE — Progress Notes (Signed)
 CSW was consulted due to [place] score of 18, answer of "hardly ever" to question #10 on Edinburgh (Thoughts of self harm), and history of depression and anxiety. CSW met with MOB to complete assessment and offer support. When CSW entered room, MOB was observed sitting in hospital bed with her older son. MOB's sister was present sitting on couch holding infant. CSW introduced self and requested to speak with MOB alone. MOB's sister and son left room. CSW explained reasons for consult. MOB presented as calm, was agreeable to CSW visit, and remained engaged during consult.   CSW inquired how MOB is feeling emotionally since infant's birth. MOB reports she is feeling "great for the most part." CSW inquired about recent mental health symptoms/Edinburgh responses. MOB reports that she had "a lot going on" during her pregnancy, marked by "always being sick" and going "back and forth between the hospital", sharing that she was ready to be induced due to having health complications during pregnancy. MOB also added that FOB was not supportive during her pregnancy. MOB shares that she endorsed symptoms of both anxiety and depression leading up until delivery, which began in the beginning of her pregnancy. MOB reports she did not endorse symptoms of depression or anxiety prior to her pregnancy and denied a history of postpartum depression.   MOB reports since giving birth, FOB, FOB's family, and her sisters have been supportive and she is feeling better emotionally. CSW inquired about current mental health treatment. MOB reports she is not current with a therapist and is not prescribed medication but has considered both treatments. MOB reports her son sees a behavioral therapist and she has thought about asking for a therapy appointment through this agency. MOB was agreeable to mental health resources, which CSW provided. MOB reports she does not feel she needs mental health medication at this time, but is open to taking  medication if symptoms persist. CSW inquired if MOB would like a referral to meet with Carolyn Cisco, the integrated behavioral health therapist at [facility]. MOB recalled that she was referred to Jamie during pregnancy but missed the appointment. Patient requested a referral to Integrated Behavioral Health (IBH). Patient verbalized understanding that the appointment will be virtual. CSW placed IBH referral. CSW inquired about MOB's answer of "hardly ever" to questions of self harm on [place] questionnaire. MOB denied endorsing thoughts of self harm currently and denied a history of endorsing thoughts of self harm. MOB stated, "I must have misunderstood the question." MOB denied current SI/HI/domestic violence.   CSW provided education regarding the baby blues period vs. perinatal mood disorders, discussed treatment and gave resources for mental health follow up if concerns arise.  CSW recommends self-evaluation during the postpartum time period using the New Mom Checklist from Postpartum Progress and encouraged MOB to contact a medical professional if symptoms are noted at any time.    MOB reports she has a pack n play, clothing, diapers and wipes for infant but is in need of a car seat and formula. CSW explained that if MOB has exhausted all resources, the hospital can provide a car seat for $30 cash.  MOB stated that she will need to purchase a car seat from the hospital and can ask a family member for the money.CSW brought car seat to room. MOB to provide $30 after family member brings money to hospital. CSW inquired about WIC benefits. MOB states she plans to call [facility] on Monday to schedule an appointment but was told by [facility] staff  when she had called previously for benefits that she would need to wait about 2 weeks for an appointment. CSW explained the hospital is not able to provide formula but gave MOB [employer] bank and [employer] information. MOB states  her family can help her purchase formula until she is able to get assistance through [facility]. MOB reports she needs to re certify for food stamps and plans to do so on Monday as well. CSW inquired about additional resource needs, MOB denied additional needs at this time.  MOB reports she is undecided on infant's follow up care. CSW provided a pediatrician list.   CSW provided review of Sudden Infant Death Syndrome (SIDS) precautions.    CSW identifies no further need for intervention and no barriers to discharge at this time.  Signed,  Elizabeth Gulling, MSW, LCSWA, LCASA 01/13/2024 3:42 PM

## 2024-01-13 NOTE — Plan of Care (Signed)

## 2024-01-13 NOTE — Anesthesia Postprocedure Evaluation (Signed)
 Anesthesia Post Note  Patient: Sharon Barnett  Procedure(s) Performed: AN AD HOC LABOR EPIDURAL     Patient location during evaluation: Mother Baby Anesthesia Type: Epidural Level of consciousness: awake and alert Pain management: pain level controlled Vital Signs Assessment: post-procedure vital signs reviewed and stable Respiratory status: spontaneous breathing, nonlabored ventilation and respiratory function stable Cardiovascular status: stable Postop Assessment: no headache, no backache, epidural receding, no apparent nausea or vomiting, patient able to bend at knees, able to ambulate and adequate PO intake Anesthetic complications: no   No notable events documented.  Last Vitals:  Vitals:   01/12/24 2309 01/13/24 0603  BP: 122/74 121/67  Pulse: 61 66  Resp: 18 16  Temp: 36.8 C 36.8 C  SpO2: 99% 100%    Last Pain:  Vitals:   01/13/24 0603  TempSrc: Oral  PainSc:    Pain Goal: Patients Stated Pain Goal: (P) 3 (01/12/24 1840)                 Baruch Lewers Hristova

## 2024-01-14 ENCOUNTER — Encounter: Payer: Self-pay | Admitting: Family Medicine

## 2024-01-14 ENCOUNTER — Other Ambulatory Visit (HOSPITAL_COMMUNITY): Payer: Self-pay

## 2024-01-14 MED ORDER — SENNOSIDES-DOCUSATE SODIUM 8.6-50 MG PO TABS
2.0000 | ORAL_TABLET | ORAL | 0 refills | Status: AC
Start: 2024-01-14 — End: ?
  Filled 2024-01-14: qty 60, 30d supply, fill #0

## 2024-01-14 MED ORDER — TETANUS-DIPHTH-ACELL PERTUSSIS 5-2.5-18.5 LF-MCG/0.5 IM SUSY
0.5000 mL | PREFILLED_SYRINGE | Freq: Once | INTRAMUSCULAR | Status: AC
Start: 2024-01-14 — End: 2024-01-14
  Administered 2024-01-14: 0.5 mL via INTRAMUSCULAR
  Filled 2024-01-14: qty 0.5

## 2024-01-14 MED ORDER — MEASLES, MUMPS & RUBELLA VAC IJ SOLR
0.5000 mL | Freq: Once | INTRAMUSCULAR | Status: AC
  Administered 2024-01-14: 0.5 mL via SUBCUTANEOUS
  Filled 2024-01-14: qty 0.5

## 2024-01-14 MED ORDER — IBUPROFEN 600 MG PO TABS
600.0000 mg | ORAL_TABLET | Freq: Four times a day (QID) | ORAL | 0 refills | Status: AC
  Filled 2024-01-14: qty 40, 10d supply, fill #0

## 2024-01-23 ENCOUNTER — Telehealth: Payer: Self-pay | Admitting: Family Medicine

## 2024-01-23 DIAGNOSIS — Z1331 Encounter for screening for depression: Secondary | ICD-10-CM

## 2024-01-23 NOTE — Telephone Encounter (Signed)
 A nurse from [facility] is leaving a message in regards to her  Sharon Barnett score which was 15 today and she does not feel like she is going to hurt herself or the baby and she knows to go to the ER if she does.

## 2024-01-25 NOTE — Telephone Encounter (Signed)
 Contacted pt in regards to concern of [place].  Pt reports that she feels good and would like a referral to [facility].  I advised pt to reach out to the office with concerns.   Docia Klar,RN  01/25/24

## 2024-01-25 NOTE — Addendum Note (Signed)
 Addended by: Corinne Dicker on: 01/25/2024 10:58 AM   Modules accepted: Orders

## 2024-02-07 ENCOUNTER — Ambulatory Visit

## 2024-02-07 DIAGNOSIS — F331 Major depressive disorder, recurrent, moderate: Secondary | ICD-10-CM | POA: Diagnosis not present

## 2024-02-07 DIAGNOSIS — F411 Generalized anxiety disorder: Secondary | ICD-10-CM

## 2024-02-07 NOTE — BH Specialist Note (Unsigned)
 Integrated Behavioral Health via Telemedicine Visit  02/07/2024 [name] [id number]  Number of Integrated Behavioral Health Clinician visits: 1- Initial Visit  Session Start time: 0818   Session End time: 0900  Total time in minutes: 42    Referring Provider: Tiffany Foerster, MD Patient/Family location: [facility] Provider location: [facility]  All persons participating in visit: Patient Sharon Barnett and [facility] Sharon Barnett   Types of Service: Individual psychotherapy and Video visit  I connected with Debbie C Emmanuel and/or Katja C Bartok's n/a via  Telephone or Video Enabled Telemedicine Application  (Video is Caregility application) and verified that I am speaking with the correct person using two identifiers. Discussed confidentiality: Yes   I discussed the limitations of telemedicine and the availability of in person appointments.  Discussed there is a possibility of technology failure and discussed alternative modes of communication if that failure occurs.  I discussed that engaging in this telemedicine visit, they consent to the provision of behavioral healthcare and the services will be billed under their insurance.  Patient and/or legal guardian expressed understanding and consented to Telemedicine visit: Yes   Presenting Concerns: Patient and/or family reports the following symptoms/concerns: Lack of quality sleep, fatigue, irritability, unmotivated, worry, poor appetite; anxious about going back to work in two weeks; pt is open to both medication and self-coping strategy today. Pt has family history of bipolar disorder (mom; aunt; one sister). Pt has good support at home (mom, sister).  Duration of problem: Ongoing; Severity of problem: moderately severe  Patient and/or Family's Strengths/Protective Factors: Social connections, Concrete supports in place (healthy food, safe environments, etc.), and Sense of  purpose  Goals Addressed: Patient will:  Reduce symptoms of: anxiety, depression, and stress   Increase knowledge and/or ability of: healthy habits and self-management skills   Demonstrate ability to: Increase healthy adjustment to current life circumstances, Increase adequate support systems for patient/family, and Increase motivation to adhere to plan of care  Progress towards Goals: Ongoing    Interventions: Interventions utilized:  Mindfulness or Relaxation Training, Functional Assessment of ADLs, Sleep Hygiene, Psychoeducation and/or Health Education, and Supportive Reflection Standardized Assessments completed: GAD-7 and PHQ 9    Patient and/or Family Response: Patient agrees with treatment plan.   Clinical Assessment/Diagnosis  Moderate episode of recurrent major depressive disorder (HCC)  Generalized anxiety disorder   Patient may benefit from psychoeducation and brief therapeutic interventions regarding coping with symptoms of depression, anxiety .  Plan: Follow up with behavioral health clinician on : Three weeks Behavioral recommendations:  -Begin prioritizing healthy self-care (regular mealswith iron -rich foods, adequate rest; allowing practical help from supportive friends and family) until at least postpartum medical appointment -Consider new mom support group as needed at either www.postpartum.net or www.conehealthybaby.com  CALM relaxation breathing exercise twice daily (morning; at bedtime with sleep sounds); as needed throughout the day. -Continue plan to add baby to EBT and [facility]  Referral(s): Integrated Art gallery manager (In Clinic) and [employer]:  Childcare  I discussed the assessment and treatment plan with the patient and/or parent/guardian. They were provided an opportunity to ask questions and all were answered. They agreed with the plan and demonstrated an understanding of the instructions.   They were advised to call back or seek  an in-person evaluation if the symptoms worsen or if the condition fails to improve as anticipated.  Murtaza Shell C Lak[place] Schweppe, LCSW      02/07/2024    8:22 AM 07/30/2023  10:00 AM 06/26/2023    9:58 AM 05/08/2023   10:22 AM 05/01/2023    4:02 PM  Depression screen PHQ 2/9  Decreased Interest 3 1 3 3 3   Down, Depressed, Hopeless 1 3 1 1 1   PHQ - 2 Score 4 4 4 4 4   Altered sleeping 3 2 1  0 3  Tired, decreased energy 3 1 3 3 3   Change in appetite 3 3 1 3  0  Feeling bad or failure about yourself  0 3 0 0 0  Trouble concentrating 0 3 0 0 1  Moving slowly or fidgety/restless 0 3 0 0 0  Suicidal thoughts 0 3 0 0 0  PHQ-9 Score 13 22 9 10 11       02/07/2024    8:25 AM 07/30/2023   10:00 AM  GAD 7 : Generalized Anxiety Score  Nervous, Anxious, on Edge 0 3  Control/stop worrying 2 2  Worry too much - different things 2 2  Trouble relaxing 1 1  Restless 0 3  Easily annoyed or irritable 3 1  Afraid - awful might happen 0 3  Total GAD 7 Score 8 15

## 2024-02-07 NOTE — Patient Instructions (Signed)
 Center for [facility] at [facility] [address] [place], [place] [address] [phone] (main office) [phone] [facility] office)  New Parent Support Groups www.postpartum.net www.conehealthybaby.com  [place] [employer]  (Childcare options, Early childcare development, etc.) www.[place]children.org  [employer] Child Care Facility Search Engine  [email or web address].[email or web address]

## 2024-02-11 ENCOUNTER — Telehealth: Payer: Self-pay

## 2024-02-11 DIAGNOSIS — O0990 Supervision of high risk pregnancy, unspecified, unspecified trimester: Secondary | ICD-10-CM

## 2024-02-12 ENCOUNTER — Telehealth: Payer: Self-pay | Admitting: *Deleted

## 2024-02-12 NOTE — Progress Notes (Signed)
 Complex Care Management Note  Care Guide Note 02/12/2024 Name: Sharon Barnett MRN: [id number] DOB: 11/04/1990  [name] is a 34 y.o. year old female who sees Albin Huh, MD for primary care. I reached out to [employer] by phone today to offer complex care management services.  [name] was given information about Complex Care Management services today including:   The Complex Care Management services include support from the care team which includes your Nurse Care Manager, Clinical Social Worker, or Pharmacist.  The Complex Care Management team is here to help remove barriers to the health concerns and goals most important to you. Complex Care Management services are voluntary, and the patient may decline or stop services at any time by request to their care team member.   Complex Care Management Consent Status: Patient agreed to services and verbal consent obtained.   Follow up plan:  Telephone appointment with complex care management team member scheduled for:  02/15/24  Encounter Outcome:  Patient Scheduled  Barnie Bora  [facility]  [facility], [facility] Guide  Direct Dial : [phone]  Fax [phone]

## 2024-02-14 ENCOUNTER — Other Ambulatory Visit: Payer: Self-pay | Admitting: Obstetrics and Gynecology

## 2024-02-14 ENCOUNTER — Telehealth: Payer: Self-pay | Admitting: Obstetrics and Gynecology

## 2024-02-14 DIAGNOSIS — F411 Generalized anxiety disorder: Secondary | ICD-10-CM

## 2024-02-14 DIAGNOSIS — F331 Major depressive disorder, recurrent, moderate: Secondary | ICD-10-CM

## 2024-02-14 MED ORDER — ESCITALOPRAM OXALATE 10 MG PO TABS: 10.0000 mg | ORAL_TABLET | Freq: Every day | ORAL | 3 refills | Status: AC

## 2024-02-14 MED ORDER — HYDROXYZINE HCL 25 MG PO TABS: 25.0000 mg | ORAL_TABLET | Freq: Three times a day (TID) | ORAL | 0 refills | Status: AC | PRN

## 2024-02-14 NOTE — Telephone Encounter (Signed)
 Called in regards to message i received from [facility]) wanting to start medication for anx/dep. No SI/HI, discussed starting lexapro, side effects discussed. Reports she has had panic attacks as well. Rx for hydroxyzine , discussed effect on milk supply. Will be following up with me 6/24. Discused 24/hr BH urgent care as needed

## 2024-02-15 ENCOUNTER — Other Ambulatory Visit: Payer: Self-pay

## 2024-02-15 NOTE — Patient Outreach (Signed)
 Complex Care Management   Visit Note  02/15/2024  Name:  Sharon Barnett MRN: [id number] DOB: 1990-12-03  Situation: Referral received for Complex Care Management related to high ED utilization I obtained verbal consent from Patient.  Visit completed with Sharon Barnett  on the phone  Background:   Past Medical History:  Diagnosis Date   ADHD (attention deficit hyperactivity disorder)    Anemia    Complication of anesthesia    Hypersensitive to anesthesia (wisdom teeth)   Fibroid    Headache    Hyperemesis gravidarum    UTI (urinary tract infection)     Assessment: Patient Reported Symptoms:  Cognitive Cognitive Status: Able to follow simple commands, Alert and oriented to person, place, and time, Normal speech and language skills Cognitive/Intellectual Conditions Management [RPT]: None reported or documented in medical history or problem list      Neurological Neurological Review of Symptoms: Not assessed    HEENT HEENT Symptoms Reported: Not assessed      Cardiovascular Cardiovascular Symptoms Reported: Not assessed    Respiratory Respiratory Symptoms Reported: Not assesed    Endocrine Patient reports the following symptoms related to hypoglycemia or hyperglycemia : Not assessed    Gastrointestinal Gastrointestinal Symptoms Reported: Not assessed      Genitourinary Genitourinary Symptoms Reported: Not assessed    Integumentary Integumentary Symptoms Reported: Not assessed    Musculoskeletal Musculoskelatal Symptoms Reviewed: Not assessed        Psychosocial Psychosocial Symptoms Reported: Not assessed            02/07/2024    8:22 AM  Depression screen PHQ 2/9  Decreased Interest 3  Down, Depressed, Hopeless 1  PHQ - 2 Score 4  Altered sleeping 3  Tired, decreased energy 3  Change in appetite 3  Feeling bad or failure about yourself  0  Trouble concentrating 0  Moving slowly or fidgety/restless 0  Suicidal thoughts 0  PHQ-9 Score 13    There  were no vitals filed for this visit.  Medications Reviewed Today   Medications were not reviewed in this encounter     Recommendation:   Continue Current Plan of Care  Follow Up Plan:   Referral from health plan due to high ED utilization. Per chart review, ED visits related to high risk pregnancy. Patient has since delivered her baby and reports feeling much better since delivery. She reports having support at home. No needs identified at this time, patient does not qualify for CCM program at this time. Advised to contact PCP if she needs additional support.  Theodora Fish, RN MSN [place]  [facility] RN Care Manager Direct Dial : [phone]  Fax: [phone]

## 2024-02-15 NOTE — Patient Instructions (Signed)
 Visit Information  Thank you for taking time to visit with me today. Please don't hesitate to contact me if I can be of assistance to you before our next scheduled appointment.  Our next appointment is no further scheduled appointments.   Please call the care guide team at [phone] if you need to cancel or reschedule your appointment.   Please call the Suicide and Crisis Lifeline: 988 call 1-800-273-TALK (toll free, 24 hour hotline) if you are experiencing a Mental Health or Behavioral Health Crisis or need someone to talk to.  Patient verbalizes understanding of instructions and care plan provided today and agrees to view in MyChart. Active MyChart status and patient understanding of how to access instructions and care plan via MyChart confirmed with patient.     Theodora Fish, RN MSN [place]  [facility] RN Care Manager Direct Dial : [phone]  Fax: [phone]

## 2024-02-25 ENCOUNTER — Ambulatory Visit: Admitting: Clinical

## 2024-02-25 DIAGNOSIS — F331 Major depressive disorder, recurrent, moderate: Secondary | ICD-10-CM

## 2024-02-25 DIAGNOSIS — F411 Generalized anxiety disorder: Secondary | ICD-10-CM

## 2024-02-25 NOTE — BH Specialist Note (Signed)
 Integrated Behavioral Health via Telemedicine Visit  02/25/2024 JEZEBEL POLLET [id number]  Number of Integrated Behavioral Health Clinician visits: 2- Second Visit  Session Start time: 0919   Session End time: 0935  Total time in minutes: 16    Referring Provider: Glenys Birk, MD Patient/Family location: Home [facility] Provider location: Center for [facility] at [facility]  All persons participating in visit: Patient Yosselyn Tax and [facility] Juel Ripley   Types of Service: Individual psychotherapy and Video visit  I connected with Leeah C Dumire and/or Vonette C Reynoso's n/a via  Telephone or Video Enabled Telemedicine Application  (Video is Caregility application) and verified that I am speaking with the correct person using two identifiers. Discussed confidentiality: Yes   I discussed the limitations of telemedicine and the availability of in person appointments.  Discussed there is a possibility of technology failure and discussed alternative modes of communication if that failure occurs.  I discussed that engaging in this telemedicine visit, they consent to the provision of behavioral healthcare and the services will be billed under their insurance.  Patient and/or legal guardian expressed understanding and consented to Telemedicine visit: Yes   Presenting Concerns: Patient and/or family reports the following symptoms/concerns: n/a Duration of problem: Ongoing depression/anxiety; physical symptoms postpartum; Severity of problem: moderately severe  Patient and/or Family's Strengths/Protective Factors: Social connections, Concrete supports in place (healthy food, safe environments, etc.), and Sense of purpose  Goals Addressed: Patient will:  Reduce symptoms of: anxiety, depression, and stress   Increase knowledge and/or ability of: healthy habits   Demonstrate ability to: Increase healthy adjustment to current life circumstances, Increase  adequate support systems for patient/family, and Increase motivation to adhere to plan of care  Progress towards Goals: Ongoing    Interventions: Interventions utilized:  Psychoeducation and/or Health Education and Supportive Reflection Standardized Assessments completed: Not Needed    Patient and/or Family Response: Patient agrees with treatment plan.   Clinical Assessment/Diagnosis  Moderate episode of recurrent major depressive disorder (HCC)  Generalized anxiety disorder .   Patient may benefit from continued therapeutic intervention  .  Plan: Follow up with behavioral health clinician on : Three weeks Behavioral recommendations:  -Continue plan to attend postpartum visit tomorrow; discuss medical concerns with medical provider at that time -Continue plan to pick up Lexapro  and hydroxyzine  today from the pharmacy -Continue prioritizing sleeping when baby sleeps; eating regular meals;time with supportive family/friends -Consider new mom support group as needed at either [email or web address] or [email or web address]   Referral(s): Integrated [employer] (In Clinic)  I discussed the assessment and treatment plan with the patient and/or parent/guardian. They were provided an opportunity to ask questions and all were answered. They agreed with the plan and demonstrated an understanding of the instructions.   They were advised to call back or seek an in-person evaluation if the symptoms worsen or if the condition fails to improve as anticipated.  Warren JAYSON Mering, LCSW     02/07/2024    8:22 AM 07/30/2023   10:00 AM 06/26/2023    9:58 AM 05/08/2023   10:22 AM 05/01/2023    4:02 PM  Depression screen PHQ 2/9  Decreased Interest 3 1 3 3 3   Down, Depressed, Hopeless 1 3 1 1 1   PHQ - 2 Score 4 4 4 4 4   Altered sleeping 3 2 1  0 3  Tired, decreased energy 3 1 3 3 3   Change in appetite 3 3 1 3  0  Feeling bad or  failure about yourself  0 3 0 0 0  Trouble  concentrating 0 3 0 0 1  Moving slowly or fidgety/restless 0 3 0 0 0  Suicidal thoughts 0 3 0 0 0  PHQ-9 Score 13 22 9 10 11       02/07/2024    8:25 AM 07/30/2023   10:00 AM  GAD 7 : Generalized Anxiety Score  Nervous, Anxious, on Edge 0 3  Control/stop worrying 2 2  Worry too much - different things 2 2  Trouble relaxing 1 1  Restless 0 3  Easily annoyed or irritable 3 1  Afraid - awful might happen 0 3  Total GAD 7 Score 8 15

## 2024-02-25 NOTE — Patient Instructions (Signed)
 Center for [facility] Healthcare at [facility] for [facility] [address] [place], [place] [address] [phone] (main office) [phone] [facility] office)  New Parent Support Groups [email or web address]

## 2024-02-26 ENCOUNTER — Ambulatory Visit: Admitting: Obstetrics and Gynecology

## 2024-03-17 ENCOUNTER — Ambulatory Visit: Admitting: Clinical

## 2024-03-17 DIAGNOSIS — F331 Major depressive disorder, recurrent, moderate: Secondary | ICD-10-CM

## 2024-03-17 DIAGNOSIS — F411 Generalized anxiety disorder: Secondary | ICD-10-CM

## 2024-03-17 NOTE — BH Specialist Note (Unsigned)
 Integrated Behavioral Health via Telemedicine Visit  03/19/2024 Sharon Barnett [id number]  Number of Integrated Behavioral Health Clinician visits: 3- Third Visit  Session Start time: 1316   Session End time: 1348  Total time in minutes: 32  Referring Provider: Glenys Birk, MD Patient/Family location: Home [facility] Provider location: [facility]  All persons participating in visit: Patient Sharon Barnett and [facility] Larry Knipp   Types of Service: Individual psychotherapy and Video visit  I connected with Sharon Barnett and/or Sharon Barnett's n/a via  Telephone or Video Enabled Telemedicine Application  (Video is Caregility application) and verified that I am speaking with the correct person using two identifiers. Discussed confidentiality: Yes   I discussed the limitations of telemedicine and the availability of in person appointments.  Discussed there is a possibility of technology failure and discussed alternative modes of communication if that failure occurs.  I discussed that engaging in this telemedicine visit, they consent to the provision of behavioral healthcare and the services will be billed under their insurance.  Patient and/or legal guardian expressed understanding and consented to Telemedicine visit: Yes   Presenting Concerns: Patient and/or family reports the following symptoms/concerns: Stopped taking Lexapro  due to side effect of diarrhea; continues taking hydroxyzine  as needed. Pt had last panic attack after loud noises on TV; worries about weight loss and fatigue with very low appetite; says may have low vitamin D and low iron . Pt's sister helps her remember to take medication and helps care for children when pt is overwhelmed.  Duration of problem: Postpartum ; Severity of problem: moderate  Patient and/or Family's Strengths/Protective Factors: Social connections, Concrete supports in place (healthy food,  safe environments, etc.), and Sense of purpose  Goals Addressed: Patient will:  Reduce symptoms of: anxiety, depression, and stress    Demonstrate ability to: Increase healthy adjustment to current life circumstances and Increase motivation to adhere to plan of care  Progress towards Goals: Ongoing    Interventions: Interventions utilized:  Motivational Interviewing and Medication Monitoring Standardized Assessments completed: GAD-7 and PHQ 9    Patient and/or Family Response: Patient agrees with treatment plan.   Clinical Assessment/Diagnosis  Moderate episode of recurrent major depressive disorder (HCC)  Generalized anxiety disorder   Patient may benefit from continued therapeutic intervention. .  Plan: Follow up with behavioral health clinician on : Three weeks Behavioral recommendations:  -Continue taking medication as prescribed; discuss any changes with medical provider at upcoming appointment -Continue accepting practical help from supportive people in life and begin prioritizing healthy self-care  Referral(s): Integrated [employer] (In Clinic)  I discussed the assessment and treatment plan with the patient and/or parent/guardian. They were provided an opportunity to ask questions and all were answered. They agreed with the plan and demonstrated an understanding of the instructions.   They were advised to call back or seek an in-person evaluation if the symptoms worsen or if the condition fails to improve as anticipated.  Warren JAYSON Merrik [place], LCSW     03/17/2024    1:29 PM 02/07/2024    8:22 AM 07/30/2023   10:00 AM 06/26/2023    9:58 AM 05/08/2023   10:22 AM  Depression screen PHQ 2/9  Decreased Interest 3 3 1 3 3   Down, Depressed, Hopeless 0 1 3 1 1   PHQ - 2 Score 3 4 4 4 4   Altered sleeping 2 3 2 1  0  Tired, decreased energy 2 3 1 3  3  Change in appetite 3 3 3 1 3   Feeling bad or failure about yourself  0 0 3 0 0  Trouble concentrating 0 0  3 0 0  Moving slowly or fidgety/restless 0 0 3 0 0  Suicidal thoughts 0 0 3 0 0  PHQ-9 Score 10 13 22 9 10       03/17/2024    1:32 PM 02/07/2024    8:25 AM 07/30/2023   10:00 AM  GAD 7 : Generalized Anxiety Score  Nervous, Anxious, on Edge 0 0 3  Control/stop worrying 1 2 2   Worry too much - different things 2 2 2   Trouble relaxing 0 1 1  Restless 1 0 3  Easily annoyed or irritable 3 3 1   Afraid - awful might happen 3 0 3  Total GAD 7 Score 10 8 15

## 2024-03-19 NOTE — Patient Instructions (Signed)
 Center for [facility] Healthcare at [facility] for [facility] [address] [place], [place] [address] [phone] (main office) [phone] [facility] office)  New Parent Support Groups [email or web address]

## 2024-04-04 NOTE — BH Specialist Note (Signed)
  Pt did not arrive to video visit and did not answer the phone; Unable to leave message; left MyChart message for patient.

## 2024-04-07 ENCOUNTER — Ambulatory Visit: Admitting: Clinical

## 2024-04-07 DIAGNOSIS — Z91199 Patient's noncompliance with other medical treatment and regimen due to unspecified reason: Secondary | ICD-10-CM

## 2024-04-22 ENCOUNTER — Encounter: Payer: Self-pay | Admitting: Family Medicine

## 2024-04-23 ENCOUNTER — Ambulatory Visit: Admitting: Obstetrics and Gynecology

## 2024-04-23 DIAGNOSIS — Z3009 Encounter for other general counseling and advice on contraception: Secondary | ICD-10-CM

## 2024-04-23 DIAGNOSIS — E049 Nontoxic goiter, unspecified: Secondary | ICD-10-CM

## 2024-04-23 DIAGNOSIS — D509 Iron deficiency anemia, unspecified: Secondary | ICD-10-CM

## 2024-04-23 DIAGNOSIS — R87613 High grade squamous intraepithelial lesion on cytologic smear of cervix (HGSIL): Secondary | ICD-10-CM
